# Patient Record
Sex: Male | Born: 1940 | Race: White | Hispanic: No | Marital: Married | Smoking: Former smoker
Health system: Southern US, Community
[De-identification: ages and names within clinical notes are randomized; demographics above are authoritative.]

## PROBLEM LIST (undated history)

## (undated) DIAGNOSIS — T8859XA Other complications of anesthesia, initial encounter: Secondary | ICD-10-CM

## (undated) DIAGNOSIS — I350 Nonrheumatic aortic (valve) stenosis: Secondary | ICD-10-CM

## (undated) DIAGNOSIS — K219 Gastro-esophageal reflux disease without esophagitis: Secondary | ICD-10-CM

## (undated) DIAGNOSIS — I2510 Atherosclerotic heart disease of native coronary artery without angina pectoris: Secondary | ICD-10-CM

## (undated) DIAGNOSIS — N189 Chronic kidney disease, unspecified: Secondary | ICD-10-CM

## (undated) DIAGNOSIS — I10 Essential (primary) hypertension: Secondary | ICD-10-CM

## (undated) DIAGNOSIS — Z87442 Personal history of urinary calculi: Secondary | ICD-10-CM

## (undated) DIAGNOSIS — R011 Cardiac murmur, unspecified: Secondary | ICD-10-CM

## (undated) DIAGNOSIS — M1990 Unspecified osteoarthritis, unspecified site: Secondary | ICD-10-CM

## (undated) DIAGNOSIS — T4145XA Adverse effect of unspecified anesthetic, initial encounter: Secondary | ICD-10-CM

## (undated) DIAGNOSIS — N529 Male erectile dysfunction, unspecified: Secondary | ICD-10-CM

## (undated) DIAGNOSIS — E785 Hyperlipidemia, unspecified: Secondary | ICD-10-CM

## (undated) HISTORY — DX: Hyperlipidemia, unspecified: E78.5

## (undated) HISTORY — PX: CYSTOSCOPY: SUR368

## (undated) HISTORY — DX: Essential (primary) hypertension: I10

## (undated) HISTORY — DX: Male erectile dysfunction, unspecified: N52.9

## (undated) HISTORY — DX: Atherosclerotic heart disease of native coronary artery without angina pectoris: I25.10

## (undated) HISTORY — PX: COLONOSCOPY: SHX174

## (undated) HISTORY — DX: Nonrheumatic aortic (valve) stenosis: I35.0

## (undated) HISTORY — PX: OTHER SURGICAL HISTORY: SHX169

---

## 2000-06-26 ENCOUNTER — Ambulatory Visit (HOSPITAL_COMMUNITY): Admission: RE | Admit: 2000-06-26 | Discharge: 2000-06-26 | Payer: Self-pay | Admitting: Gastroenterology

## 2000-06-26 ENCOUNTER — Encounter (INDEPENDENT_AMBULATORY_CARE_PROVIDER_SITE_OTHER): Payer: Self-pay

## 2002-06-02 ENCOUNTER — Encounter: Payer: Self-pay | Admitting: Otolaryngology

## 2002-06-02 ENCOUNTER — Encounter: Admission: RE | Admit: 2002-06-02 | Discharge: 2002-06-02 | Payer: Self-pay | Admitting: Otolaryngology

## 2002-10-25 ENCOUNTER — Encounter: Payer: Self-pay | Admitting: Family Medicine

## 2002-10-25 ENCOUNTER — Encounter: Admission: RE | Admit: 2002-10-25 | Discharge: 2002-10-25 | Payer: Self-pay | Admitting: Family Medicine

## 2008-09-20 ENCOUNTER — Encounter: Admission: RE | Admit: 2008-09-20 | Discharge: 2008-09-20 | Payer: Self-pay | Admitting: Internal Medicine

## 2009-06-04 ENCOUNTER — Encounter: Admission: RE | Admit: 2009-06-04 | Discharge: 2009-06-04 | Payer: Self-pay | Admitting: Orthopedic Surgery

## 2010-01-11 HISTORY — PX: CARDIAC CATHETERIZATION: SHX172

## 2010-01-21 ENCOUNTER — Inpatient Hospital Stay (HOSPITAL_COMMUNITY): Admission: EM | Admit: 2010-01-21 | Discharge: 2010-01-23 | Payer: Self-pay | Admitting: Emergency Medicine

## 2010-01-23 ENCOUNTER — Ambulatory Visit: Payer: Self-pay | Admitting: Surgery

## 2010-01-23 ENCOUNTER — Encounter (INDEPENDENT_AMBULATORY_CARE_PROVIDER_SITE_OTHER): Payer: Self-pay | Admitting: Cardiology

## 2010-01-31 ENCOUNTER — Ambulatory Visit (HOSPITAL_COMMUNITY): Admission: RE | Admit: 2010-01-31 | Discharge: 2010-01-31 | Payer: Self-pay | Admitting: Cardiology

## 2011-01-01 LAB — CBC
HCT: 39.7 % (ref 39.0–52.0)
HCT: 42.4 % (ref 39.0–52.0)
Hemoglobin: 14.9 g/dL (ref 13.0–17.0)
MCV: 104.5 fL — ABNORMAL HIGH (ref 78.0–100.0)
MCV: 105.2 fL — ABNORMAL HIGH (ref 78.0–100.0)
Platelets: 144 10*3/uL — ABNORMAL LOW (ref 150–400)
RBC: 3.77 MIL/uL — ABNORMAL LOW (ref 4.22–5.81)
RBC: 4.06 MIL/uL — ABNORMAL LOW (ref 4.22–5.81)
WBC: 5.3 10*3/uL (ref 4.0–10.5)
WBC: 6.3 10*3/uL (ref 4.0–10.5)

## 2011-01-01 LAB — URINE CULTURE: Colony Count: NO GROWTH

## 2011-01-01 LAB — COMPREHENSIVE METABOLIC PANEL
ALT: 49 U/L (ref 0–53)
AST: 45 U/L — ABNORMAL HIGH (ref 0–37)
Alkaline Phosphatase: 55 U/L (ref 39–117)
CO2: 27 mEq/L (ref 19–32)
Calcium: 9.3 mg/dL (ref 8.4–10.5)
Chloride: 103 mEq/L (ref 96–112)
GFR calc non Af Amer: 55 mL/min — ABNORMAL LOW (ref >60)
Potassium: 4.1 mEq/L (ref 3.5–5.1)
Sodium: 136 mEq/L (ref 135–145)

## 2011-01-01 LAB — GLUCOSE, CAPILLARY

## 2011-01-01 LAB — BASIC METABOLIC PANEL
CO2: 25 mEq/L (ref 19–32)
CO2: 26 mEq/L (ref 19–32)
Calcium: 8.7 mg/dL (ref 8.4–10.5)
Chloride: 110 mEq/L (ref 96–112)
GFR calc non Af Amer: 58 mL/min — ABNORMAL LOW (ref >60)
Sodium: 137 mEq/L (ref 135–145)
Sodium: 140 mEq/L (ref 135–145)

## 2011-01-01 LAB — DIFFERENTIAL
Basophils Absolute: 0 10*3/uL (ref 0.0–0.1)
Eosinophils Absolute: 0.1 10*3/uL (ref 0.0–0.7)
Eosinophils Relative: 2 % (ref 0–5)
Lymphocytes Relative: 34 % (ref 12–46)
Monocytes Absolute: 0.6 10*3/uL (ref 0.1–1.0)

## 2011-01-01 LAB — CK TOTAL AND CKMB (NOT AT ARMC): Relative Index: 2.6 — ABNORMAL HIGH (ref 0.0–2.5)

## 2011-01-01 LAB — APTT: aPTT: 26 seconds (ref 24–37)

## 2011-01-01 LAB — LIPID PANEL: Triglycerides: 30 mg/dL (ref <150)

## 2011-02-28 NOTE — Procedures (Signed)
[place]. [facility]  Patient:    Jeff Rivera, Jeff Rivera                          MRN: [id number] Proc. Date: 06/26/00 Adm. Date:  [id number] Disc. Date: [id number] Attending:  Nelda Marseille CC:         Teena Irani. Arlyce Dice, M.D.  Petra Kuba, M.D.   Procedure Report  PROCEDURE:  Esophagogastroduodenoscopy with biopsy.  INDICATION:  Longstanding reflux want to rule out Barretts.  The consent was signed after risks, benefits, methods and options were thoroughly discussed in the office.  MEDICINES USED:  Demerol 75 mg, Versed 7.5 mg.  PROCEDURE:  The video endoscope was inserted by direct vision.  The esophagus was normal except for some minimal reflux change.  There were no signs of Barretts esophagus.  There was a small hiatal hernia.  The scope passed easily into the stomach and advanced through the antrum pertinent for some minimal gastritis and through a normal pylorus into a normal duodenal bulb and around the ileum to a normal cecum portion and duodenum.  The scope was withdrawn back to the bulb and a good look there ruled out any ulcers in all locations.  Scope was withdrawn back to the stomach and retroflexed high in the cardia where the hiatal hernia was confirmed.  The angularis, lesser and greater curve and fundus were all normal on retroflex visualization.  On straight visualization of the stomach other than the minimal antritis no other abnormalities were seen.  The scope was then slowly withdrawn back to about 20 cm, which confirmed the above esophageal findings. The scope was then advanced to the distal esophagus and a few biopsies were obtained to rule out any microscopic problem like Barretts.  The scope was removed, the patient tolerated the procedure well.  There were no obvious immediate complications.  ENDOSCOPIC DIAGNOSES: 1. Small hiatal hernia. 2. Minimal reflux change status post distal esophageal biopsies to    rule out  Barretts. 3. Minimal antritis. 4. Otherwise normal esophagogastroduodenoscopy.  PLAN:  Continue pump inhibitors, await pathology to see if future endoscopic screening is needed and otherwise continue screening with a flex sig/colonoscopy.  Please see that dictation for details. DD:  06/26/00 TD:  06/29/00 Job: [id number]

## 2011-02-28 NOTE — Procedures (Signed)
[place]. [facility]  Patient:    Jeff Rivera, Jeff Rivera                          MRN: [id number] Proc. Date: 06/26/00 Adm. Date:  [id number] Disc. Date: [id number] Attending:  Nelda Marseille CC:         Teena Irani. Arlyce Dice, M.D.   Procedure Report  PROCEDURE PERFORMED:  Colonoscopy  INDICATION:  Screening.  Consent was signed after risks, benefits, methods and options were thoroughly discussed in the office before any premedications given.  No additional medicines were used for this procedure since it followed the endoscopy.  DESCRIPTION OF PROCEDURE:  Rectal inspection was pertinent for small external hemorrhoids.  Digital exam was negative.  Video colonoscope was inserted and very easily advanced around the colon to the cecum.  This did require a little left lower quadrant pressure but no position changes.  The cecum was identified by the appendiceal orifice and the ileocecal valve.  Prep was adequate. There was some liquid stool that required washing and suctioning. Then some left sided diverticula.  No abnormalities were seen on insertion. On slow withdrawal through the colon, the cecum, ascending, transverse and majority of the descending was all normal.  His sigmoid was slightly tortuous and some diverticula were seen but no other polypoid lesions, masses or other abnormalities were seen. Once back in the rectum, the scope was retroflexed pertinent for some internal hemorrhoids.  The scope was straightened and advanced a short ways up the sigmoid.  Air was suctioned and the scope removed.  The patient tolerated the procedure well.  There was no obvious immediate complication.  ENDOSCOPIC DIAGNOSES: 1. Internal and external hemorrhoids. 2. Left sided diverticula and tortuous sigmoid. 3. Otherwise within normal limits to the cecum.  PLAN:  Yearly rectals and guaiac per Dr. Arlyce Dice. Happy to see back p.r.n. otherwise repeat colonic screening in five to ten  years. DD:  06/26/00 TD:  06/29/00 Job: [id number]

## 2011-07-15 ENCOUNTER — Encounter (HOSPITAL_COMMUNITY): Payer: Medicare Other | Attending: Internal Medicine

## 2011-07-15 DIAGNOSIS — M810 Age-related osteoporosis without current pathological fracture: Secondary | ICD-10-CM | POA: Insufficient documentation

## 2011-09-13 HISTORY — PX: TRANSTHORACIC ECHOCARDIOGRAM: SHX275

## 2011-10-14 HISTORY — PX: EYE SURGERY: SHX253

## 2011-10-21 DIAGNOSIS — H2510 Age-related nuclear cataract, unspecified eye: Secondary | ICD-10-CM | POA: Diagnosis not present

## 2011-10-21 DIAGNOSIS — H43819 Vitreous degeneration, unspecified eye: Secondary | ICD-10-CM | POA: Diagnosis not present

## 2011-12-23 DIAGNOSIS — E785 Hyperlipidemia, unspecified: Secondary | ICD-10-CM | POA: Diagnosis not present

## 2011-12-23 DIAGNOSIS — M25569 Pain in unspecified knee: Secondary | ICD-10-CM | POA: Diagnosis not present

## 2011-12-23 DIAGNOSIS — R7301 Impaired fasting glucose: Secondary | ICD-10-CM | POA: Diagnosis not present

## 2011-12-23 DIAGNOSIS — I10 Essential (primary) hypertension: Secondary | ICD-10-CM | POA: Diagnosis not present

## 2011-12-23 DIAGNOSIS — M1710 Unilateral primary osteoarthritis, unspecified knee: Secondary | ICD-10-CM | POA: Diagnosis not present

## 2011-12-23 DIAGNOSIS — M810 Age-related osteoporosis without current pathological fracture: Secondary | ICD-10-CM | POA: Diagnosis not present

## 2011-12-23 DIAGNOSIS — K219 Gastro-esophageal reflux disease without esophagitis: Secondary | ICD-10-CM | POA: Diagnosis not present

## 2011-12-30 DIAGNOSIS — M25569 Pain in unspecified knee: Secondary | ICD-10-CM | POA: Diagnosis not present

## 2011-12-30 DIAGNOSIS — M1710 Unilateral primary osteoarthritis, unspecified knee: Secondary | ICD-10-CM | POA: Diagnosis not present

## 2012-06-24 DIAGNOSIS — M810 Age-related osteoporosis without current pathological fracture: Secondary | ICD-10-CM | POA: Diagnosis not present

## 2012-06-24 DIAGNOSIS — E785 Hyperlipidemia, unspecified: Secondary | ICD-10-CM | POA: Diagnosis not present

## 2012-06-24 DIAGNOSIS — Z125 Encounter for screening for malignant neoplasm of prostate: Secondary | ICD-10-CM | POA: Diagnosis not present

## 2012-06-24 DIAGNOSIS — R7301 Impaired fasting glucose: Secondary | ICD-10-CM | POA: Diagnosis not present

## 2012-06-24 DIAGNOSIS — I10 Essential (primary) hypertension: Secondary | ICD-10-CM | POA: Diagnosis not present

## 2012-06-24 DIAGNOSIS — Z0000 Encounter for general adult medical examination without abnormal findings: Secondary | ICD-10-CM | POA: Diagnosis not present

## 2012-07-01 DIAGNOSIS — R7301 Impaired fasting glucose: Secondary | ICD-10-CM | POA: Diagnosis not present

## 2012-07-01 DIAGNOSIS — H6120 Impacted cerumen, unspecified ear: Secondary | ICD-10-CM | POA: Diagnosis not present

## 2012-07-01 DIAGNOSIS — L570 Actinic keratosis: Secondary | ICD-10-CM | POA: Diagnosis not present

## 2012-07-01 DIAGNOSIS — M810 Age-related osteoporosis without current pathological fracture: Secondary | ICD-10-CM | POA: Diagnosis not present

## 2012-07-01 DIAGNOSIS — I10 Essential (primary) hypertension: Secondary | ICD-10-CM | POA: Diagnosis not present

## 2012-07-01 DIAGNOSIS — E785 Hyperlipidemia, unspecified: Secondary | ICD-10-CM | POA: Diagnosis not present

## 2012-08-02 DIAGNOSIS — Z23 Encounter for immunization: Secondary | ICD-10-CM | POA: Diagnosis not present

## 2012-08-02 DIAGNOSIS — M79609 Pain in unspecified limb: Secondary | ICD-10-CM | POA: Diagnosis not present

## 2012-08-13 DIAGNOSIS — S93609A Unspecified sprain of unspecified foot, initial encounter: Secondary | ICD-10-CM | POA: Diagnosis not present

## 2012-08-13 DIAGNOSIS — M216X9 Other acquired deformities of unspecified foot: Secondary | ICD-10-CM | POA: Diagnosis not present

## 2012-08-23 DIAGNOSIS — R42 Dizziness and giddiness: Secondary | ICD-10-CM | POA: Diagnosis not present

## 2012-09-27 DIAGNOSIS — E782 Mixed hyperlipidemia: Secondary | ICD-10-CM | POA: Diagnosis not present

## 2012-09-27 DIAGNOSIS — I059 Rheumatic mitral valve disease, unspecified: Secondary | ICD-10-CM | POA: Diagnosis not present

## 2012-09-27 DIAGNOSIS — I2510 Atherosclerotic heart disease of native coronary artery without angina pectoris: Secondary | ICD-10-CM | POA: Diagnosis not present

## 2012-09-27 DIAGNOSIS — I10 Essential (primary) hypertension: Secondary | ICD-10-CM | POA: Diagnosis not present

## 2012-10-21 DIAGNOSIS — H40059 Ocular hypertension, unspecified eye: Secondary | ICD-10-CM | POA: Diagnosis not present

## 2012-10-21 DIAGNOSIS — H01009 Unspecified blepharitis unspecified eye, unspecified eyelid: Secondary | ICD-10-CM | POA: Diagnosis not present

## 2012-10-21 DIAGNOSIS — H2510 Age-related nuclear cataract, unspecified eye: Secondary | ICD-10-CM | POA: Diagnosis not present

## 2012-10-21 DIAGNOSIS — H5200 Hypermetropia, unspecified eye: Secondary | ICD-10-CM | POA: Diagnosis not present

## 2012-10-25 DIAGNOSIS — H903 Sensorineural hearing loss, bilateral: Secondary | ICD-10-CM | POA: Diagnosis not present

## 2012-10-25 DIAGNOSIS — R42 Dizziness and giddiness: Secondary | ICD-10-CM | POA: Diagnosis not present

## 2012-10-25 DIAGNOSIS — H9319 Tinnitus, unspecified ear: Secondary | ICD-10-CM | POA: Diagnosis not present

## 2012-12-08 ENCOUNTER — Other Ambulatory Visit (INDEPENDENT_AMBULATORY_CARE_PROVIDER_SITE_OTHER): Payer: Self-pay | Admitting: Otolaryngology

## 2012-12-14 ENCOUNTER — Ambulatory Visit
Admission: RE | Admit: 2012-12-14 | Discharge: 2012-12-14 | Disposition: A | Discharge status: Home / Self Care | Payer: Medicare Other | Source: Ambulatory Visit | Attending: Otolaryngology | Admitting: Otolaryngology

## 2012-12-14 MED ORDER — GADOBENATE DIMEGLUMINE 529 MG/ML IV SOLN
15.0000 mL | Freq: Once | INTRAVENOUS | Status: AC | PRN
  Administered 2012-12-14: 15 mL via INTRAVENOUS

## 2013-01-04 DIAGNOSIS — I10 Essential (primary) hypertension: Secondary | ICD-10-CM | POA: Diagnosis not present

## 2013-01-04 DIAGNOSIS — E785 Hyperlipidemia, unspecified: Secondary | ICD-10-CM | POA: Diagnosis not present

## 2013-01-04 DIAGNOSIS — K219 Gastro-esophageal reflux disease without esophagitis: Secondary | ICD-10-CM | POA: Diagnosis not present

## 2013-01-26 DIAGNOSIS — N3943 Post-void dribbling: Secondary | ICD-10-CM | POA: Diagnosis not present

## 2013-01-26 DIAGNOSIS — R3915 Urgency of urination: Secondary | ICD-10-CM | POA: Diagnosis not present

## 2013-01-26 DIAGNOSIS — N3941 Urge incontinence: Secondary | ICD-10-CM | POA: Diagnosis not present

## 2013-01-26 DIAGNOSIS — N401 Enlarged prostate with lower urinary tract symptoms: Secondary | ICD-10-CM | POA: Diagnosis not present

## 2013-05-23 DIAGNOSIS — M25579 Pain in unspecified ankle and joints of unspecified foot: Secondary | ICD-10-CM | POA: Diagnosis not present

## 2013-05-23 DIAGNOSIS — M1710 Unilateral primary osteoarthritis, unspecified knee: Secondary | ICD-10-CM | POA: Diagnosis not present

## 2013-05-23 DIAGNOSIS — M25569 Pain in unspecified knee: Secondary | ICD-10-CM | POA: Diagnosis not present

## 2013-06-10 DIAGNOSIS — L02419 Cutaneous abscess of limb, unspecified: Secondary | ICD-10-CM | POA: Diagnosis not present

## 2013-06-15 ENCOUNTER — Other Ambulatory Visit: Payer: Self-pay | Admitting: Internal Medicine

## 2013-06-15 DIAGNOSIS — R0602 Shortness of breath: Secondary | ICD-10-CM

## 2013-06-15 DIAGNOSIS — I10 Essential (primary) hypertension: Secondary | ICD-10-CM | POA: Diagnosis not present

## 2013-06-16 ENCOUNTER — Telehealth (HOSPITAL_COMMUNITY): Payer: Self-pay | Admitting: Internal Medicine

## 2013-06-16 ENCOUNTER — Encounter (HOSPITAL_COMMUNITY): Payer: Self-pay | Admitting: *Deleted

## 2013-06-16 ENCOUNTER — Other Ambulatory Visit (HOSPITAL_COMMUNITY): Payer: Self-pay | Admitting: Internal Medicine

## 2013-06-16 DIAGNOSIS — R0602 Shortness of breath: Secondary | ICD-10-CM

## 2013-06-17 ENCOUNTER — Ambulatory Visit
Admission: RE | Admit: 2013-06-17 | Discharge: 2013-06-17 | Disposition: A | Discharge status: Home / Self Care | Payer: Medicare Other | Source: Ambulatory Visit | Attending: Internal Medicine | Admitting: Internal Medicine

## 2013-06-17 DIAGNOSIS — J984 Other disorders of lung: Secondary | ICD-10-CM | POA: Diagnosis not present

## 2013-06-17 DIAGNOSIS — R0602 Shortness of breath: Secondary | ICD-10-CM

## 2013-06-17 MED ORDER — IOHEXOL 300 MG/ML  SOLN
75.0000 mL | Freq: Once | INTRAMUSCULAR | Status: AC | PRN
  Administered 2013-06-17: 75 mL via INTRAVENOUS

## 2013-06-27 DIAGNOSIS — M25569 Pain in unspecified knee: Secondary | ICD-10-CM | POA: Diagnosis not present

## 2013-06-27 DIAGNOSIS — M1710 Unilateral primary osteoarthritis, unspecified knee: Secondary | ICD-10-CM | POA: Diagnosis not present

## 2013-06-27 DIAGNOSIS — M25579 Pain in unspecified ankle and joints of unspecified foot: Secondary | ICD-10-CM | POA: Diagnosis not present

## 2013-06-29 ENCOUNTER — Ambulatory Visit (HOSPITAL_COMMUNITY)
Admission: RE | Admit: 2013-06-29 | Discharge: 2013-06-29 | Disposition: A | Discharge status: Home / Self Care | Payer: Medicare Other | Source: Ambulatory Visit | Attending: Cardiovascular Disease | Admitting: Cardiovascular Disease

## 2013-06-29 DIAGNOSIS — R0602 Shortness of breath: Secondary | ICD-10-CM | POA: Diagnosis not present

## 2013-07-07 DIAGNOSIS — Z125 Encounter for screening for malignant neoplasm of prostate: Secondary | ICD-10-CM | POA: Diagnosis not present

## 2013-07-07 DIAGNOSIS — I10 Essential (primary) hypertension: Secondary | ICD-10-CM | POA: Diagnosis not present

## 2013-07-07 DIAGNOSIS — E559 Vitamin D deficiency, unspecified: Secondary | ICD-10-CM | POA: Diagnosis not present

## 2013-07-07 DIAGNOSIS — Z1331 Encounter for screening for depression: Secondary | ICD-10-CM | POA: Diagnosis not present

## 2013-07-07 DIAGNOSIS — R7301 Impaired fasting glucose: Secondary | ICD-10-CM | POA: Diagnosis not present

## 2013-07-07 DIAGNOSIS — E785 Hyperlipidemia, unspecified: Secondary | ICD-10-CM | POA: Diagnosis not present

## 2013-07-07 DIAGNOSIS — Z0000 Encounter for general adult medical examination without abnormal findings: Secondary | ICD-10-CM | POA: Diagnosis not present

## 2013-07-14 DIAGNOSIS — M810 Age-related osteoporosis without current pathological fracture: Secondary | ICD-10-CM | POA: Diagnosis not present

## 2013-07-14 DIAGNOSIS — R972 Elevated prostate specific antigen [PSA]: Secondary | ICD-10-CM | POA: Diagnosis not present

## 2013-07-14 DIAGNOSIS — I10 Essential (primary) hypertension: Secondary | ICD-10-CM | POA: Diagnosis not present

## 2013-07-14 DIAGNOSIS — Z23 Encounter for immunization: Secondary | ICD-10-CM | POA: Diagnosis not present

## 2013-07-14 DIAGNOSIS — E785 Hyperlipidemia, unspecified: Secondary | ICD-10-CM | POA: Diagnosis not present

## 2013-07-15 ENCOUNTER — Other Ambulatory Visit (HOSPITAL_COMMUNITY): Payer: Self-pay | Admitting: Internal Medicine

## 2013-07-15 DIAGNOSIS — I059 Rheumatic mitral valve disease, unspecified: Secondary | ICD-10-CM

## 2013-07-22 DIAGNOSIS — N401 Enlarged prostate with lower urinary tract symptoms: Secondary | ICD-10-CM | POA: Diagnosis not present

## 2013-07-22 DIAGNOSIS — R972 Elevated prostate specific antigen [PSA]: Secondary | ICD-10-CM | POA: Diagnosis not present

## 2013-07-25 ENCOUNTER — Ambulatory Visit (HOSPITAL_COMMUNITY)
Admission: RE | Admit: 2013-07-25 | Discharge: 2013-07-25 | Disposition: A | Discharge status: Home / Self Care | Payer: Medicare Other | Source: Ambulatory Visit | Attending: Cardiovascular Disease | Admitting: Cardiovascular Disease

## 2013-07-25 DIAGNOSIS — M810 Age-related osteoporosis without current pathological fracture: Secondary | ICD-10-CM | POA: Diagnosis not present

## 2013-07-25 DIAGNOSIS — I359 Nonrheumatic aortic valve disorder, unspecified: Secondary | ICD-10-CM | POA: Diagnosis not present

## 2013-07-25 DIAGNOSIS — I059 Rheumatic mitral valve disease, unspecified: Secondary | ICD-10-CM | POA: Insufficient documentation

## 2013-07-25 NOTE — Progress Notes (Signed)
2D Echo Performed 07/25/2013    Lavere Stork, RCS  

## 2013-08-26 DIAGNOSIS — R972 Elevated prostate specific antigen [PSA]: Secondary | ICD-10-CM | POA: Diagnosis not present

## 2013-09-05 ENCOUNTER — Encounter: Payer: Self-pay | Admitting: *Deleted

## 2013-09-06 ENCOUNTER — Encounter: Payer: Self-pay | Admitting: Internal Medicine

## 2013-09-12 ENCOUNTER — Ambulatory Visit (INDEPENDENT_AMBULATORY_CARE_PROVIDER_SITE_OTHER): Payer: Medicare Other | Admitting: Internal Medicine

## 2013-09-12 ENCOUNTER — Encounter: Payer: Self-pay | Admitting: Internal Medicine

## 2013-09-12 VITALS — BP 128/80 | HR 75 | Ht 68.0 in | Wt 174.2 lb

## 2013-09-12 DIAGNOSIS — I359 Nonrheumatic aortic valve disorder, unspecified: Secondary | ICD-10-CM

## 2013-09-12 DIAGNOSIS — I2510 Atherosclerotic heart disease of native coronary artery without angina pectoris: Secondary | ICD-10-CM | POA: Insufficient documentation

## 2013-09-12 DIAGNOSIS — Z79899 Other long term (current) drug therapy: Secondary | ICD-10-CM

## 2013-09-12 DIAGNOSIS — Z789 Other specified health status: Secondary | ICD-10-CM

## 2013-09-12 DIAGNOSIS — I350 Nonrheumatic aortic (valve) stenosis: Secondary | ICD-10-CM

## 2013-09-12 DIAGNOSIS — R0602 Shortness of breath: Secondary | ICD-10-CM | POA: Diagnosis not present

## 2013-09-12 DIAGNOSIS — E785 Hyperlipidemia, unspecified: Secondary | ICD-10-CM

## 2013-09-12 DIAGNOSIS — I10 Essential (primary) hypertension: Secondary | ICD-10-CM

## 2013-09-12 DIAGNOSIS — R0609 Other forms of dyspnea: Secondary | ICD-10-CM | POA: Diagnosis not present

## 2013-09-12 DIAGNOSIS — R9439 Abnormal result of other cardiovascular function study: Secondary | ICD-10-CM | POA: Diagnosis not present

## 2013-09-12 DIAGNOSIS — Z888 Allergy status to other drugs, medicaments and biological substances status: Secondary | ICD-10-CM

## 2013-09-12 NOTE — Patient Instructions (Signed)
Your physician has requested that you have en exercise stress myoview. For further information please visit [email or web address] Please follow instruction sheet, as given.  Your physician recommends that you return for lab work in a few days to a week. You will need to be fasting - nothing to eat/drink after midnight.  Your physician recommends that you schedule a follow-up appointment after your stress test.

## 2013-09-12 NOTE — Progress Notes (Signed)
OFFICE NOTE  Chief Complaint:  DOE  Primary Care Physician: Thayer Headings, MD  HPI:  Jeff Rivera is a 72 year old gentleman with history of hypertension, dyslipidemia and known coronary disease. He had a 40% LAD lesion by cath in 2011. He also has mild aortic stenosis with a valve area of 1.8. He had a repeat echocardiogram in December 2012 which showed an EF of greater than 55% and peak gradient of 15 mmHg, mean gradient of 10, valve area of 1.8. This does seem consistent with the sound of his aortic stenosis by auscultation. Recently he's had some problems with shortness of breath, especially when walking up a hill. His primary care provider ordered an exercise treadmill stress test as well as an echocardiogram. He did undergo exercise treadmill stress testing which was discontinued fairly early possibly due to hypertensive response, but is not clear as he felt that he could exercise further. Nevertheless the test was nonconclusive. He also underwent a repeat echocardiogram which showed a normal systolic function, and mild to moderate aortic stenosis with a valve area of approximately 1.3-1.4, which may be slightly worse than it was in 2012.  He does report a significant change in his symptoms with shortness of breath on exercise. It is not clear whether this is due to decreased activity or could represent coronary ischemia. Because of his poor performance and the fact that he did not complete a treadmill exercise stress test, I think there is still concern for possible ischemia.  PMHx:  Past Medical History  Diagnosis Date  . Hypertension   . Dyslipidemia   . CAD (coronary artery disease)   . Aortic stenosis     mild   . ED (erectile dysfunction)     Past Surgical History  Procedure Laterality Date  . Cardiac catheterization  01/2010    40% LAD lesion (Dr. Mervyn Skeeters. [place])   . Transthoracic echocardiogram  09/2011    EF=>55%, mild conc LVH; normal RV systolic function; LA mildly  dilated, IVC borderline dilated with collapse >50%; mild mitral annular calcif & mild MR; trace TR, elevated RVSP; AV mildly sclerotic, mild calcif of AV leaflets, mild valvular AS, mild regurg; trace pulm valve regurg    FAMHx:  Family History  Problem Relation Age of Onset  . Heart attack Father   . Coronary artery disease Father     SOCHx:   reports that he quit smoking about 52 years ago. He has never used smokeless tobacco. He reports that he drinks about 0.5 ounces of alcohol per week. He reports that he does not use illicit drugs.  ALLERGIES:  No Known Allergies  ROS: A comprehensive review of systems was negative except for: Respiratory: positive for dyspnea on exertion  HOME MEDS: Current Outpatient Prescriptions  Medication Sig Dispense Refill  . aspirin 81 MG tablet Take 81 mg by mouth daily.      . calcium citrate-vitamin D 500-400 MG-UNIT chewable tablet Chew 1 tablet by mouth 2 (two) times daily.      . cholecalciferol (VITAMIN D) 400 UNITS TABS tablet Take 800 Units by mouth 2 (two) times daily.      . Coenzyme Q10 (CO Q 10 PO) Take 2,000 mg by mouth daily.      . fluticasone (FLONASE) 50 MCG/ACT nasal spray as needed.      Marland Kitchen lisinopril (PRINIVIL,ZESTRIL) 10 MG tablet Take 10 mg by mouth daily.      Marland Kitchen MAGNESIUM PO Take 400 mg by mouth daily.       Marland Kitchen  NIACIN CR PO Take 1,000 mg by mouth 2 (two) times daily.       . Omega-3 Fatty Acids (FISH OIL) 1200 MG CAPS Take 1 capsule by mouth daily.      Marland Kitchen omeprazole (PRILOSEC) 20 MG capsule Take 20 mg by mouth daily.      . simvastatin (ZOCOR) 40 MG tablet Take 20 mg by mouth daily.      Marland Kitchen atorvastatin (LIPITOR) 40 MG tablet Take 40 mg by mouth daily.       No current facility-administered medications for this visit.    LABS/IMAGING: No results found for this or any previous visit (from the past 48 hour(s)). No results found.  VITALS: BP 128/80  Pulse 75  Ht 5\' 8"  (1.727 m)  Wt 174 lb 3.2 oz (79.017 kg)  BMI 26.49  kg/m2  EXAM: General appearance: alert and no distress Neck: no carotid bruit and no JVD Lungs: clear to auscultation bilaterally Heart: regular rate and rhythm, S1, S2 normal and systolic murmur: early systolic 3/6, crescendo at 2nd right intercostal space Abdomen: soft, non-tender; bowel sounds normal; no masses,  no organomegaly Extremities: extremities normal, atraumatic, no cyanosis or edema Pulses: 2+ and symmetric Skin: Skin color, texture, turgor normal. No rashes or lesions Neurologic: Grossly normal Psych: Mood, affect normal  EKG: Normal sinus rhythm at 75, possible early repolarization changes  ASSESSMENT: 1. Coronary artery disease with a known 40% LAD lesion 2. Mild to moderate aortic stenosis which is slightly worse than in 2012 3. Dyslipidemia 4. Hypertension 5. Dyspnea on exertion-possibly concerning for angina  PLAN: 1.   Mr. Zayed had a recent workup of worsening shortness of breath which revealed possibly slightly worsening of his aortic stenosis, but not enough to explain his shortness of breath. He does have known LAD disease by cath 3 years ago. Unfortunately his exercise stress test was stopped early for unknown reasons, possibly due to hypertensive response. He did feel that he can exercise further and denied any chest pain, but was short of breath with exercise. I'm concerned this could be worsening LAD ischemia and I would recommend a LexiScan nuclear stress test. Also check a lipid profile as he has been taking simvastatin and recently niacin was added. Plan to see him back in a few weeks to discuss the results.  Chrystie Nose, MD, [facility] Attending Cardiologist [facility]  Caterina Racine C 09/12/2013, 12:24 PM

## 2013-09-13 ENCOUNTER — Other Ambulatory Visit (HOSPITAL_COMMUNITY): Payer: Self-pay | Admitting: Internal Medicine

## 2013-09-13 DIAGNOSIS — I25810 Atherosclerosis of coronary artery bypass graft(s) without angina pectoris: Secondary | ICD-10-CM

## 2013-09-16 ENCOUNTER — Ambulatory Visit (HOSPITAL_COMMUNITY)
Admission: RE | Admit: 2013-09-16 | Discharge: 2013-09-16 | Disposition: A | Discharge status: Home / Self Care | Payer: Medicare Other | Source: Ambulatory Visit | Attending: Internal Medicine | Admitting: Internal Medicine

## 2013-09-16 DIAGNOSIS — I25810 Atherosclerosis of coronary artery bypass graft(s) without angina pectoris: Secondary | ICD-10-CM | POA: Diagnosis not present

## 2013-09-16 DIAGNOSIS — E663 Overweight: Secondary | ICD-10-CM | POA: Diagnosis not present

## 2013-09-16 DIAGNOSIS — R0989 Other specified symptoms and signs involving the circulatory and respiratory systems: Secondary | ICD-10-CM | POA: Insufficient documentation

## 2013-09-16 DIAGNOSIS — Z87891 Personal history of nicotine dependence: Secondary | ICD-10-CM | POA: Insufficient documentation

## 2013-09-16 DIAGNOSIS — R0609 Other forms of dyspnea: Secondary | ICD-10-CM | POA: Insufficient documentation

## 2013-09-16 DIAGNOSIS — I10 Essential (primary) hypertension: Secondary | ICD-10-CM | POA: Insufficient documentation

## 2013-09-16 DIAGNOSIS — I2510 Atherosclerotic heart disease of native coronary artery without angina pectoris: Secondary | ICD-10-CM | POA: Insufficient documentation

## 2013-09-16 DIAGNOSIS — I359 Nonrheumatic aortic valve disorder, unspecified: Secondary | ICD-10-CM | POA: Diagnosis not present

## 2013-09-16 DIAGNOSIS — Z8249 Family history of ischemic heart disease and other diseases of the circulatory system: Secondary | ICD-10-CM | POA: Diagnosis not present

## 2013-09-16 MED ORDER — REGADENOSON 0.4 MG/5ML IV SOLN
0.4000 mg | Freq: Once | INTRAVENOUS | Status: AC
  Administered 2013-09-16: 0.4 mg via INTRAVENOUS

## 2013-09-16 MED ORDER — TECHNETIUM TC 99M SESTAMIBI GENERIC - CARDIOLITE
30.8000 | Freq: Once | INTRAVENOUS | Status: AC | PRN
  Administered 2013-09-16: 30.8 via INTRAVENOUS

## 2013-09-16 MED ORDER — TECHNETIUM TC 99M SESTAMIBI GENERIC - CARDIOLITE
10.4000 | Freq: Once | INTRAVENOUS | Status: AC | PRN
  Administered 2013-09-16: 10 via INTRAVENOUS

## 2013-09-16 NOTE — Procedures (Addendum)
[facility] [address] [place] [place] [address] [phone]  Cardiology Nuclear Med Study  Jeff Rivera is a 72 y.o. male     MRN : [id number]     DOB: 1941-10-02  Procedure Date: 09/16/2013  Nuclear Med Background Indication for Stress Test:  Evaluation for Ischemia and Abnormal EKG History:  CAD;MILD AORTIC STENOSIS Cardiac Risk Factors: Family History - CAD, History of Smoking, Hypertension, Lipids and Overweight  Symptoms:  DOE and SOB   Nuclear Pre-Procedure Caffeine/Decaff Intake:  7:00pm NPO After: 5:00am   IV Site: R Hand  IV 0.9% NS with Angio Cath:  22g  Chest Size (in):  40"  IV Started by: Emmit Pomfret, RN  Height: 5\' 8"  (1.727 m)  Cup Size: n/a  BMI:  Body mass index is 26.46 kg/(m^2). Weight:  174 lb (78.926 kg)   Tech Comments:  N/A    Nuclear Med Study 1 or 2 day study: 1 day  Stress Test Type:  Lexiscan  Order Authorizing Provider:  Zoila Shutter, MD   Resting Radionuclide: Technetium 67m Sestamibi  Resting Radionuclide Dose: 10.4 mCi   Stress Radionuclide:  Technetium 44m Sestamibi  Stress Radionuclide Dose: 30.8 mCi           Stress Protocol Rest HR: 74 Stress HR: 106  Rest BP: 152/76 Stress BP: 158/71  Exercise Time (min): n/a METS: n/a          Dose of Adenosine (mg):  n/a Dose of Lexiscan: 0.4 mg  Dose of Atropine (mg): n/a Dose of Dobutamine: n/a mcg/kg/min (at max HR)  Stress Test Technologist: Ernestene Mention, CCT Nuclear Technologist: Gonzella Lex, CNMT   Rest Procedure:  Myocardial perfusion imaging was performed at rest 45 minutes following the intravenous administration of Technetium 35m Sestamibi. Stress Procedure:  The patient received IV Lexiscan 0.4 mg over 15-seconds.  Technetium 61m Sestamibi injected at 30-seconds.  There were no significant changes with Lexiscan.  Quantitative spect images were obtained after a 45 minute delay.  Transient Ischemic Dilatation (Normal  <1.22):  1.05 Lung/Heart Ratio (Normal <0.45):  0.24 QGS EDV:  65 ml QGS ESV:  11 ml LV Ejection Fraction: 82%  Rest ECG: NSR - Normal EKG  Stress ECG: No significant change from baseline ECG  QPS Raw Data Images:  Mild diaphragmatic attenuation.  Normal left ventricular size. Stress Images:  There is decreased uptake in the inferior wall. Rest Images:  There is decreased uptake in the inferior wall. Subtraction (SDS):  There is a fixed inferior defect that is most consistent with diaphragmatic attenuation.  Impression Exercise Capacity:  Lexiscan with no exercise. BP Response:  Normal blood pressure response. Clinical Symptoms:  There is dyspnea. ECG Impression:  No significant ECG changes with Lexiscan. Comparison with Prior Nuclear Study: No previous nuclear study performed  Overall Impression:  Low risk stress nuclear study with mild inferior bowel attenuation artifact.  LV Wall Motion:  NL LV Function; NL Wall Motion; EF 82%.  Chrystie Nose, MD, [facility] Board Certified in Nuclear Cardiology Attending Cardiologist [facility]  Chrystie Nose, MD  09/16/2013 11:49 AM

## 2013-11-02 DIAGNOSIS — M1710 Unilateral primary osteoarthritis, unspecified knee: Secondary | ICD-10-CM | POA: Diagnosis not present

## 2013-11-07 DIAGNOSIS — H40059 Ocular hypertension, unspecified eye: Secondary | ICD-10-CM | POA: Diagnosis not present

## 2013-11-07 DIAGNOSIS — H25019 Cortical age-related cataract, unspecified eye: Secondary | ICD-10-CM | POA: Diagnosis not present

## 2013-11-07 DIAGNOSIS — H2510 Age-related nuclear cataract, unspecified eye: Secondary | ICD-10-CM | POA: Diagnosis not present

## 2013-11-07 DIAGNOSIS — H35379 Puckering of macula, unspecified eye: Secondary | ICD-10-CM | POA: Diagnosis not present

## 2013-11-16 DIAGNOSIS — H2589 Other age-related cataract: Secondary | ICD-10-CM | POA: Diagnosis not present

## 2013-11-16 DIAGNOSIS — H25019 Cortical age-related cataract, unspecified eye: Secondary | ICD-10-CM | POA: Diagnosis not present

## 2013-11-16 DIAGNOSIS — H2510 Age-related nuclear cataract, unspecified eye: Secondary | ICD-10-CM | POA: Diagnosis not present

## 2013-11-16 DIAGNOSIS — H35379 Puckering of macula, unspecified eye: Secondary | ICD-10-CM | POA: Diagnosis not present

## 2013-12-12 DIAGNOSIS — H35379 Puckering of macula, unspecified eye: Secondary | ICD-10-CM | POA: Diagnosis not present

## 2013-12-14 DIAGNOSIS — H2589 Other age-related cataract: Secondary | ICD-10-CM | POA: Diagnosis not present

## 2013-12-14 DIAGNOSIS — H2510 Age-related nuclear cataract, unspecified eye: Secondary | ICD-10-CM | POA: Diagnosis not present

## 2013-12-14 DIAGNOSIS — H25019 Cortical age-related cataract, unspecified eye: Secondary | ICD-10-CM | POA: Diagnosis not present

## 2013-12-29 DIAGNOSIS — M1710 Unilateral primary osteoarthritis, unspecified knee: Secondary | ICD-10-CM | POA: Diagnosis not present

## 2013-12-29 DIAGNOSIS — M25569 Pain in unspecified knee: Secondary | ICD-10-CM | POA: Diagnosis not present

## 2014-01-05 DIAGNOSIS — I10 Essential (primary) hypertension: Secondary | ICD-10-CM | POA: Diagnosis not present

## 2014-01-05 DIAGNOSIS — M810 Age-related osteoporosis without current pathological fracture: Secondary | ICD-10-CM | POA: Diagnosis not present

## 2014-01-05 DIAGNOSIS — R7301 Impaired fasting glucose: Secondary | ICD-10-CM | POA: Diagnosis not present

## 2014-01-12 DIAGNOSIS — R7301 Impaired fasting glucose: Secondary | ICD-10-CM | POA: Diagnosis not present

## 2014-01-12 DIAGNOSIS — E785 Hyperlipidemia, unspecified: Secondary | ICD-10-CM | POA: Diagnosis not present

## 2014-01-12 DIAGNOSIS — M810 Age-related osteoporosis without current pathological fracture: Secondary | ICD-10-CM | POA: Diagnosis not present

## 2014-01-12 DIAGNOSIS — I10 Essential (primary) hypertension: Secondary | ICD-10-CM | POA: Diagnosis not present

## 2014-03-15 DIAGNOSIS — M76899 Other specified enthesopathies of unspecified lower limb, excluding foot: Secondary | ICD-10-CM | POA: Diagnosis not present

## 2014-03-15 DIAGNOSIS — M1710 Unilateral primary osteoarthritis, unspecified knee: Secondary | ICD-10-CM | POA: Diagnosis not present

## 2014-03-21 DIAGNOSIS — T148XXA Other injury of unspecified body region, initial encounter: Secondary | ICD-10-CM | POA: Diagnosis not present

## 2014-03-21 DIAGNOSIS — L570 Actinic keratosis: Secondary | ICD-10-CM | POA: Diagnosis not present

## 2014-03-21 DIAGNOSIS — D235 Other benign neoplasm of skin of trunk: Secondary | ICD-10-CM | POA: Diagnosis not present

## 2014-03-21 DIAGNOSIS — L821 Other seborrheic keratosis: Secondary | ICD-10-CM | POA: Diagnosis not present

## 2014-05-22 DIAGNOSIS — H6120 Impacted cerumen, unspecified ear: Secondary | ICD-10-CM | POA: Diagnosis not present

## 2014-06-26 DIAGNOSIS — M1710 Unilateral primary osteoarthritis, unspecified knee: Secondary | ICD-10-CM | POA: Diagnosis not present

## 2014-06-27 DIAGNOSIS — G5760 Lesion of plantar nerve, unspecified lower limb: Secondary | ICD-10-CM | POA: Diagnosis not present

## 2014-06-27 DIAGNOSIS — M7750 Other enthesopathy of unspecified foot: Secondary | ICD-10-CM | POA: Diagnosis not present

## 2014-07-04 DIAGNOSIS — G5760 Lesion of plantar nerve, unspecified lower limb: Secondary | ICD-10-CM | POA: Diagnosis not present

## 2014-07-04 DIAGNOSIS — M7750 Other enthesopathy of unspecified foot: Secondary | ICD-10-CM | POA: Diagnosis not present

## 2014-07-17 DIAGNOSIS — Z0000 Encounter for general adult medical examination without abnormal findings: Secondary | ICD-10-CM | POA: Diagnosis not present

## 2014-07-17 DIAGNOSIS — Z1389 Encounter for screening for other disorder: Secondary | ICD-10-CM | POA: Diagnosis not present

## 2014-07-17 DIAGNOSIS — M810 Age-related osteoporosis without current pathological fracture: Secondary | ICD-10-CM | POA: Diagnosis not present

## 2014-07-17 DIAGNOSIS — R7301 Impaired fasting glucose: Secondary | ICD-10-CM | POA: Diagnosis not present

## 2014-07-17 DIAGNOSIS — I10 Essential (primary) hypertension: Secondary | ICD-10-CM | POA: Diagnosis not present

## 2014-07-21 DIAGNOSIS — R7301 Impaired fasting glucose: Secondary | ICD-10-CM | POA: Diagnosis not present

## 2014-07-21 DIAGNOSIS — I10 Essential (primary) hypertension: Secondary | ICD-10-CM | POA: Diagnosis not present

## 2014-07-21 DIAGNOSIS — E785 Hyperlipidemia, unspecified: Secondary | ICD-10-CM | POA: Diagnosis not present

## 2014-07-21 DIAGNOSIS — Z23 Encounter for immunization: Secondary | ICD-10-CM | POA: Diagnosis not present

## 2014-07-21 DIAGNOSIS — R972 Elevated prostate specific antigen [PSA]: Secondary | ICD-10-CM | POA: Diagnosis not present

## 2014-10-09 DIAGNOSIS — M1712 Unilateral primary osteoarthritis, left knee: Secondary | ICD-10-CM | POA: Diagnosis not present

## 2014-10-09 DIAGNOSIS — M1711 Unilateral primary osteoarthritis, right knee: Secondary | ICD-10-CM | POA: Diagnosis not present

## 2014-10-11 ENCOUNTER — Other Ambulatory Visit (HOSPITAL_COMMUNITY): Payer: Self-pay | Admitting: Orthopaedic Surgery

## 2014-10-18 DIAGNOSIS — H8110 Benign paroxysmal vertigo, unspecified ear: Secondary | ICD-10-CM | POA: Diagnosis not present

## 2014-10-18 DIAGNOSIS — R102 Pelvic and perineal pain: Secondary | ICD-10-CM | POA: Diagnosis not present

## 2014-10-18 DIAGNOSIS — R109 Unspecified abdominal pain: Secondary | ICD-10-CM | POA: Diagnosis not present

## 2014-10-18 DIAGNOSIS — R100 Acute abdomen: Secondary | ICD-10-CM | POA: Diagnosis not present

## 2014-10-20 ENCOUNTER — Other Ambulatory Visit (HOSPITAL_COMMUNITY): Payer: Self-pay | Admitting: *Deleted

## 2014-10-20 ENCOUNTER — Encounter (HOSPITAL_COMMUNITY): Payer: Self-pay

## 2014-10-20 ENCOUNTER — Encounter (HOSPITAL_COMMUNITY)
Admission: RE | Admit: 2014-10-20 | Discharge: 2014-10-20 | Disposition: A | Discharge status: Home / Self Care | Payer: Medicare Other | Source: Ambulatory Visit | Attending: Orthopaedic Surgery | Admitting: Orthopaedic Surgery

## 2014-10-20 ENCOUNTER — Ambulatory Visit (HOSPITAL_COMMUNITY)
Admission: RE | Admit: 2014-10-20 | Discharge: 2014-10-20 | Disposition: A | Discharge status: Home / Self Care | Payer: Medicare Other | Source: Ambulatory Visit | Attending: Anesthesiology | Admitting: Anesthesiology

## 2014-10-20 DIAGNOSIS — I10 Essential (primary) hypertension: Secondary | ICD-10-CM

## 2014-10-20 DIAGNOSIS — I2510 Atherosclerotic heart disease of native coronary artery without angina pectoris: Secondary | ICD-10-CM | POA: Diagnosis not present

## 2014-10-20 DIAGNOSIS — E785 Hyperlipidemia, unspecified: Secondary | ICD-10-CM | POA: Diagnosis not present

## 2014-10-20 DIAGNOSIS — Z01812 Encounter for preprocedural laboratory examination: Secondary | ICD-10-CM | POA: Insufficient documentation

## 2014-10-20 DIAGNOSIS — Z01818 Encounter for other preprocedural examination: Secondary | ICD-10-CM | POA: Diagnosis not present

## 2014-10-20 DIAGNOSIS — Z7982 Long term (current) use of aspirin: Secondary | ICD-10-CM | POA: Diagnosis not present

## 2014-10-20 DIAGNOSIS — I350 Nonrheumatic aortic (valve) stenosis: Secondary | ICD-10-CM | POA: Diagnosis not present

## 2014-10-20 DIAGNOSIS — M170 Bilateral primary osteoarthritis of knee: Secondary | ICD-10-CM | POA: Insufficient documentation

## 2014-10-20 HISTORY — DX: Gastro-esophageal reflux disease without esophagitis: K21.9

## 2014-10-20 HISTORY — DX: Unspecified osteoarthritis, unspecified site: M19.90

## 2014-10-20 LAB — BASIC METABOLIC PANEL
Anion gap: 2 — ABNORMAL LOW (ref 5–15)
BUN: 24 mg/dL — ABNORMAL HIGH (ref 6–23)
CO2: 29 mmol/L (ref 19–32)
Calcium: 10 mg/dL (ref 8.4–10.5)
Chloride: 106 mEq/L (ref 96–112)
Creatinine, Ser: 1.25 mg/dL (ref 0.50–1.35)
GFR calc non Af Amer: 55 mL/min — ABNORMAL LOW (ref >90)
GFR, EST AFRICAN AMERICAN: 63 mL/min — AB (ref >90)
Glucose, Bld: 109 mg/dL — ABNORMAL HIGH (ref 70–99)
Potassium: 4.7 mmol/L (ref 3.5–5.1)
Sodium: 137 mmol/L (ref 135–145)

## 2014-10-20 LAB — URINALYSIS, ROUTINE W REFLEX MICROSCOPIC
BILIRUBIN URINE: NEGATIVE
Glucose, UA: NEGATIVE mg/dL
Hgb urine dipstick: NEGATIVE
Ketones, ur: NEGATIVE mg/dL
Leukocytes, UA: NEGATIVE
NITRITE: NEGATIVE
Protein, ur: NEGATIVE mg/dL
Specific Gravity, Urine: 1.017 (ref 1.005–1.030)
Urobilinogen, UA: 1 mg/dL (ref 0.0–1.0)
pH: 6 (ref 5.0–8.0)

## 2014-10-20 LAB — CBC
HCT: 45.2 % (ref 39.0–52.0)
Hemoglobin: 15.5 g/dL (ref 13.0–17.0)
MCH: 34.2 pg — ABNORMAL HIGH (ref 26.0–34.0)
MCHC: 34.3 g/dL (ref 30.0–36.0)
MCV: 99.8 fL (ref 78.0–100.0)
Platelets: 196 10*3/uL (ref 150–400)
RBC: 4.53 MIL/uL (ref 4.22–5.81)
RDW: 13.2 % (ref 11.5–15.5)
WBC: 7.4 10*3/uL (ref 4.0–10.5)

## 2014-10-20 LAB — APTT: aPTT: 30 seconds (ref 24–37)

## 2014-10-20 LAB — PROTIME-INR
INR: 1 (ref 0.00–1.49)
Prothrombin Time: 13.3 seconds (ref 11.6–15.2)

## 2014-10-20 LAB — SURGICAL PCR SCREEN
MRSA, PCR: NEGATIVE
Staphylococcus aureus: NEGATIVE

## 2014-10-20 LAB — ABO/RH: ABO/RH(D): O POS

## 2014-10-20 NOTE — Progress Notes (Signed)
Stress test 09-16-13 epic

## 2014-10-20 NOTE — Patient Instructions (Addendum)
Jeff Rivera  10/20/2014   Your procedure is scheduled on: Friday Oct 27, 2014  Report to [facility] Main  Entrance and follow signs to               [place] at 1050 AM.  Call this number if you have problems the morning of surgery [phone]   Remember:  Do not eat food or drink liquids :After Midnight.     Take these medicines the morning of surgery with A SIP OF WATER: FLONASE NASAL SPRAY IF NEEDED, OMEPRAZOLE                               You may not have any metal on your body including hair pins and              piercings  Do not wear jewelry, make-up, lotions, powders or perfumes.             Do not wear nail polish.  Do not shave  48 hours prior to surgery.              Men may shave face and neck.   Do not bring valuables to the hospital. [place].  Contacts, dentures or bridgework may not be worn into surgery.  Leave suitcase in the car. After surgery it may be brought to your room.     Patients discharged the day of surgery will not be allowed to drive home.  Name and phone number of your driver:  Special Instructions: N/A              Please read over the following fact sheets you were given: _____________________________________________________________________             [facility] - Preparing for Surgery Before surgery, you can play an important role.  Because skin is not sterile, your skin needs to be as free of germs as possible.  You can reduce the number of germs on your skin by washing with CHG (chlorahexidine gluconate) soap before surgery.  CHG is an antiseptic cleaner which kills germs and bonds with the skin to continue killing germs even after washing. Please DO NOT use if you have an allergy to CHG or antibacterial soaps.  If your skin becomes reddened/irritated stop using the CHG and inform your nurse when you arrive at Short Stay. Do not shave (including legs and  underarms) for at least 48 hours prior to the first CHG shower.  You may shave your face/neck. Please follow these instructions carefully:  1.  Shower with CHG Soap the night before surgery and the  morning of Surgery.  2.  If you choose to wash your hair, wash your hair first as usual with your  normal  shampoo.  3.  After you shampoo, rinse your hair and body thoroughly to remove the  shampoo.                           4.  Use CHG as you would any other liquid soap.  You can apply chg directly  to the skin and wash  Gently with a scrungie or clean washcloth.  5.  Apply the CHG Soap to your body ONLY FROM THE NECK DOWN.   Do not use on face/ open                           Wound or open sores. Avoid contact with eyes, ears mouth and genitals (private parts).                       Wash face,  Genitals (private parts) with your normal soap.             6.  Wash thoroughly, paying special attention to the area where your surgery  will be performed.  7.  Thoroughly rinse your body with warm water from the neck down.  8.  DO NOT shower/wash with your normal soap after using and rinsing off  the CHG Soap.                9.  Pat yourself dry with a clean towel.            10.  Wear clean pajamas.            11.  Place clean sheets on your bed the night of your first shower and do not  sleep with pets. Day of Surgery : Do not apply any lotions/deodorants the morning of surgery.  Please wear clean clothes to the hospital/surgery center.  FAILURE TO FOLLOW THESE INSTRUCTIONS MAY RESULT IN THE CANCELLATION OF YOUR SURGERY PATIENT SIGNATURE_________________________________  NURSE SIGNATURE__________________________________  ________________________________________________________________________

## 2014-10-24 ENCOUNTER — Other Ambulatory Visit (HOSPITAL_COMMUNITY): Payer: Self-pay | Admitting: Orthopaedic Surgery

## 2014-10-27 ENCOUNTER — Inpatient Hospital Stay (HOSPITAL_COMMUNITY): Payer: Medicare Other | Admitting: Certified Registered Nurse Anesthetist

## 2014-10-27 ENCOUNTER — Encounter (HOSPITAL_COMMUNITY): Payer: Self-pay | Admitting: *Deleted

## 2014-10-27 ENCOUNTER — Inpatient Hospital Stay (HOSPITAL_COMMUNITY): Payer: Medicare Other

## 2014-10-27 ENCOUNTER — Encounter (HOSPITAL_COMMUNITY)
Admission: RE | Disposition: A | Discharge status: Home Health | Payer: Self-pay | Source: Ambulatory Visit | Attending: Orthopaedic Surgery

## 2014-10-27 ENCOUNTER — Inpatient Hospital Stay (HOSPITAL_COMMUNITY)
Admission: RE | Admit: 2014-10-27 | Discharge: 2014-10-31 | DRG: 470 | Disposition: A | Discharge status: Home Health | Payer: Medicare Other | Source: Ambulatory Visit | Attending: Orthopaedic Surgery | Admitting: Orthopaedic Surgery

## 2014-10-27 DIAGNOSIS — Z87891 Personal history of nicotine dependence: Secondary | ICD-10-CM

## 2014-10-27 DIAGNOSIS — M1711 Unilateral primary osteoarthritis, right knee: Secondary | ICD-10-CM | POA: Diagnosis not present

## 2014-10-27 DIAGNOSIS — M1712 Unilateral primary osteoarthritis, left knee: Secondary | ICD-10-CM

## 2014-10-27 DIAGNOSIS — I2510 Atherosclerotic heart disease of native coronary artery without angina pectoris: Secondary | ICD-10-CM | POA: Diagnosis present

## 2014-10-27 DIAGNOSIS — M1990 Unspecified osteoarthritis, unspecified site: Secondary | ICD-10-CM | POA: Diagnosis not present

## 2014-10-27 DIAGNOSIS — M179 Osteoarthritis of knee, unspecified: Secondary | ICD-10-CM | POA: Diagnosis not present

## 2014-10-27 DIAGNOSIS — M25562 Pain in left knee: Secondary | ICD-10-CM | POA: Diagnosis present

## 2014-10-27 DIAGNOSIS — I10 Essential (primary) hypertension: Secondary | ICD-10-CM | POA: Diagnosis present

## 2014-10-27 DIAGNOSIS — M659 Synovitis and tenosynovitis, unspecified: Secondary | ICD-10-CM | POA: Diagnosis present

## 2014-10-27 DIAGNOSIS — Z96652 Presence of left artificial knee joint: Secondary | ICD-10-CM

## 2014-10-27 DIAGNOSIS — Z96642 Presence of left artificial hip joint: Secondary | ICD-10-CM | POA: Diagnosis not present

## 2014-10-27 DIAGNOSIS — D62 Acute posthemorrhagic anemia: Secondary | ICD-10-CM | POA: Diagnosis not present

## 2014-10-27 DIAGNOSIS — R2681 Unsteadiness on feet: Secondary | ICD-10-CM | POA: Diagnosis not present

## 2014-10-27 DIAGNOSIS — M170 Bilateral primary osteoarthritis of knee: Secondary | ICD-10-CM | POA: Diagnosis not present

## 2014-10-27 DIAGNOSIS — Z471 Aftercare following joint replacement surgery: Secondary | ICD-10-CM | POA: Diagnosis not present

## 2014-10-27 DIAGNOSIS — M25662 Stiffness of left knee, not elsewhere classified: Secondary | ICD-10-CM | POA: Diagnosis not present

## 2014-10-27 DIAGNOSIS — Z96651 Presence of right artificial knee joint: Secondary | ICD-10-CM

## 2014-10-27 DIAGNOSIS — M6281 Muscle weakness (generalized): Secondary | ICD-10-CM | POA: Diagnosis not present

## 2014-10-27 DIAGNOSIS — R531 Weakness: Secondary | ICD-10-CM | POA: Diagnosis not present

## 2014-10-27 DIAGNOSIS — E785 Hyperlipidemia, unspecified: Secondary | ICD-10-CM | POA: Diagnosis present

## 2014-10-27 HISTORY — PX: TOTAL KNEE ARTHROPLASTY: SHX125

## 2014-10-27 SURGERY — ARTHROPLASTY, KNEE, TOTAL
Anesthesia: Spinal | Site: Knee | Laterality: Left

## 2014-10-27 MED ORDER — BUPIVACAINE IN DEXTROSE 0.75-8.25 % IT SOLN
INTRATHECAL | Status: DC | PRN
  Administered 2014-10-27: 1.7 mL via INTRATHECAL

## 2014-10-27 MED ORDER — LISINOPRIL 10 MG PO TABS
10.0000 mg | ORAL_TABLET | Freq: Every morning | ORAL | Status: DC
  Administered 2014-10-28 – 2014-10-31 (×2): 10 mg via ORAL
  Filled 2014-10-27 (×4): qty 1

## 2014-10-27 MED ORDER — OXYCODONE HCL 5 MG PO TABS
5.0000 mg | ORAL_TABLET | ORAL | Status: DC | PRN
  Administered 2014-10-27 – 2014-10-30 (×8): 5 mg via ORAL
  Filled 2014-10-27 (×8): qty 1
  Filled 2014-10-27: qty 2

## 2014-10-27 MED ORDER — METHOCARBAMOL 500 MG PO TABS
500.0000 mg | ORAL_TABLET | Freq: Four times a day (QID) | ORAL | Status: DC | PRN
  Administered 2014-10-28 – 2014-10-31 (×7): 500 mg via ORAL
  Filled 2014-10-27 (×8): qty 1

## 2014-10-27 MED ORDER — PHENYLEPHRINE HCL 10 MG/ML IJ SOLN
10.0000 mg | INTRAMUSCULAR | Status: DC | PRN
  Administered 2014-10-27: 60 ug/min via INTRAVENOUS

## 2014-10-27 MED ORDER — NIACIN 500 MG PO TABS
500.0000 mg | ORAL_TABLET | Freq: Two times a day (BID) | ORAL | Status: DC
  Administered 2014-10-28 – 2014-10-31 (×6): 500 mg via ORAL
  Filled 2014-10-27 (×9): qty 1

## 2014-10-27 MED ORDER — ONDANSETRON HCL 4 MG/2ML IJ SOLN: 4.0000 mg | Freq: Four times a day (QID) | INTRAMUSCULAR | Status: DC | PRN

## 2014-10-27 MED ORDER — ASPIRIN EC 81 MG PO TBEC
81.0000 mg | DELAYED_RELEASE_TABLET | Freq: Every day | ORAL | Status: DC
  Administered 2014-10-27 – 2014-10-31 (×5): 81 mg via ORAL
  Filled 2014-10-27 (×5): qty 1

## 2014-10-27 MED ORDER — PANTOPRAZOLE SODIUM 40 MG PO TBEC
40.0000 mg | DELAYED_RELEASE_TABLET | Freq: Every day | ORAL | Status: DC
  Administered 2014-10-28 – 2014-10-31 (×4): 40 mg via ORAL
  Filled 2014-10-27 (×5): qty 1

## 2014-10-27 MED ORDER — ASPIRIN 81 MG PO TABS: 81.0000 mg | ORAL_TABLET | Freq: Every day | ORAL | Status: DC

## 2014-10-27 MED ORDER — ONDANSETRON HCL 4 MG PO TABS: 4.0000 mg | ORAL_TABLET | Freq: Four times a day (QID) | ORAL | Status: DC | PRN

## 2014-10-27 MED ORDER — MIDAZOLAM HCL 2 MG/2ML IJ SOLN
INTRAMUSCULAR | Status: AC
  Filled 2014-10-27: qty 2

## 2014-10-27 MED ORDER — MENTHOL 3 MG MT LOZG
1.0000 | LOZENGE | OROMUCOSAL | Status: DC | PRN
  Filled 2014-10-27: qty 9

## 2014-10-27 MED ORDER — HYDROMORPHONE HCL 1 MG/ML IJ SOLN
INTRAMUSCULAR | Status: AC
  Filled 2014-10-27: qty 1

## 2014-10-27 MED ORDER — METOCLOPRAMIDE HCL 10 MG PO TABS: 5.0000 mg | ORAL_TABLET | Freq: Three times a day (TID) | ORAL | Status: DC | PRN

## 2014-10-27 MED ORDER — KETOROLAC TROMETHAMINE 30 MG/ML IJ SOLN
INTRAMUSCULAR | Status: AC
  Filled 2014-10-27: qty 1

## 2014-10-27 MED ORDER — METOCLOPRAMIDE HCL 5 MG/ML IJ SOLN: 5.0000 mg | Freq: Three times a day (TID) | INTRAMUSCULAR | Status: DC | PRN

## 2014-10-27 MED ORDER — ZOLPIDEM TARTRATE 5 MG PO TABS: 5.0000 mg | ORAL_TABLET | Freq: Every evening | ORAL | Status: DC | PRN

## 2014-10-27 MED ORDER — ACETAMINOPHEN 10 MG/ML IV SOLN
1000.0000 mg | Freq: Once | INTRAVENOUS | Status: AC
  Administered 2014-10-27: 1000 mg via INTRAVENOUS
  Filled 2014-10-27: qty 100

## 2014-10-27 MED ORDER — FENTANYL CITRATE 0.05 MG/ML IJ SOLN
INTRAMUSCULAR | Status: DC | PRN
  Administered 2014-10-27: 50 ug via INTRAVENOUS
  Administered 2014-10-27: 12.5 ug via INTRAVENOUS
  Administered 2014-10-27: 25 ug via INTRAVENOUS
  Administered 2014-10-27: 12.5 ug via INTRAVENOUS

## 2014-10-27 MED ORDER — SIMVASTATIN 20 MG PO TABS
20.0000 mg | ORAL_TABLET | Freq: Every evening | ORAL | Status: DC
  Administered 2014-10-27 – 2014-10-29 (×3): 20 mg via ORAL
  Filled 2014-10-27 (×5): qty 1

## 2014-10-27 MED ORDER — PROPOFOL 10 MG/ML IV BOLUS
INTRAVENOUS | Status: DC | PRN
  Administered 2014-10-27: 20 mg via INTRAVENOUS

## 2014-10-27 MED ORDER — SODIUM CHLORIDE 0.9 % IV SOLN
1000.0000 mg | INTRAVENOUS | Status: DC
  Filled 2014-10-27: qty 10

## 2014-10-27 MED ORDER — PHENOL 1.4 % MT LIQD: 1.0000 | OROMUCOSAL | Status: DC | PRN

## 2014-10-27 MED ORDER — LACTATED RINGERS IV SOLN
INTRAVENOUS | Status: DC
  Administered 2014-10-27: 15:00:00 via INTRAVENOUS
  Administered 2014-10-27: 1000 mL via INTRAVENOUS
  Administered 2014-10-27: 15:00:00 via INTRAVENOUS

## 2014-10-27 MED ORDER — ONDANSETRON HCL 4 MG/2ML IJ SOLN
INTRAMUSCULAR | Status: DC | PRN
  Administered 2014-10-27: 4 mg via INTRAVENOUS

## 2014-10-27 MED ORDER — CEFAZOLIN SODIUM-DEXTROSE 2-3 GM-% IV SOLR
2.0000 g | INTRAVENOUS | Status: AC
  Administered 2014-10-27: 2 g via INTRAVENOUS

## 2014-10-27 MED ORDER — RIVAROXABAN 10 MG PO TABS
10.0000 mg | ORAL_TABLET | Freq: Every day | ORAL | Status: DC
  Administered 2014-10-28 – 2014-10-31 (×4): 10 mg via ORAL
  Filled 2014-10-27 (×5): qty 1

## 2014-10-27 MED ORDER — SODIUM CHLORIDE 0.9 % IJ SOLN
INTRAMUSCULAR | Status: AC
  Filled 2014-10-27: qty 50

## 2014-10-27 MED ORDER — MIDAZOLAM HCL 5 MG/5ML IJ SOLN
INTRAMUSCULAR | Status: DC | PRN
  Administered 2014-10-27 (×2): 1 mg via INTRAVENOUS

## 2014-10-27 MED ORDER — ALUM & MAG HYDROXIDE-SIMETH 200-200-20 MG/5ML PO SUSP: 30.0000 mL | ORAL | Status: DC | PRN

## 2014-10-27 MED ORDER — ACETAMINOPHEN 650 MG RE SUPP: 650.0000 mg | Freq: Four times a day (QID) | RECTAL | Status: DC | PRN

## 2014-10-27 MED ORDER — CEFAZOLIN SODIUM-DEXTROSE 2-3 GM-% IV SOLR
INTRAVENOUS | Status: AC
  Filled 2014-10-27: qty 50

## 2014-10-27 MED ORDER — 0.9 % SODIUM CHLORIDE (POUR BTL) OPTIME
TOPICAL | Status: DC | PRN
  Administered 2014-10-27: 1000 mL

## 2014-10-27 MED ORDER — FENTANYL CITRATE 0.05 MG/ML IJ SOLN
INTRAMUSCULAR | Status: AC
  Filled 2014-10-27: qty 2

## 2014-10-27 MED ORDER — ONDANSETRON HCL 4 MG/2ML IJ SOLN
INTRAMUSCULAR | Status: AC
  Filled 2014-10-27: qty 2

## 2014-10-27 MED ORDER — PROPOFOL 10 MG/ML IV BOLUS
INTRAVENOUS | Status: AC
  Filled 2014-10-27: qty 20

## 2014-10-27 MED ORDER — HYDROMORPHONE HCL 1 MG/ML IJ SOLN
0.5000 mg | INTRAMUSCULAR | Status: AC | PRN
  Administered 2014-10-27: 0.5 mg via INTRAVENOUS
  Administered 2014-10-27: 0.25 mg via INTRAVENOUS
  Administered 2014-10-27: 0.5 mg via INTRAVENOUS
  Administered 2014-10-27: 0.25 mg via INTRAVENOUS

## 2014-10-27 MED ORDER — METHOCARBAMOL 1000 MG/10ML IJ SOLN
500.0000 mg | Freq: Four times a day (QID) | INTRAVENOUS | Status: DC | PRN
  Administered 2014-10-27: 500 mg via INTRAVENOUS
  Filled 2014-10-27 (×2): qty 5

## 2014-10-27 MED ORDER — SODIUM CHLORIDE 0.9 % IJ SOLN
INTRAMUSCULAR | Status: DC | PRN
  Administered 2014-10-27: 40 mL via INTRAVENOUS

## 2014-10-27 MED ORDER — CEFAZOLIN SODIUM 1-5 GM-% IV SOLN
1.0000 g | Freq: Four times a day (QID) | INTRAVENOUS | Status: AC
  Administered 2014-10-27 – 2014-10-28 (×2): 1 g via INTRAVENOUS
  Filled 2014-10-27 (×2): qty 50

## 2014-10-27 MED ORDER — SODIUM CHLORIDE 0.9 % IV SOLN
INTRAVENOUS | Status: DC
  Administered 2014-10-27: 18:00:00 via INTRAVENOUS

## 2014-10-27 MED ORDER — SODIUM CHLORIDE 0.9 % IR SOLN
Status: DC | PRN
  Administered 2014-10-27: 1000 mL

## 2014-10-27 MED ORDER — DOCUSATE SODIUM 100 MG PO CAPS
100.0000 mg | ORAL_CAPSULE | Freq: Two times a day (BID) | ORAL | Status: DC
  Administered 2014-10-27 – 2014-10-31 (×8): 100 mg via ORAL

## 2014-10-27 MED ORDER — KETOROLAC TROMETHAMINE 30 MG/ML IJ SOLN
30.0000 mg | Freq: Once | INTRAMUSCULAR | Status: AC
  Administered 2014-10-27: 30 mg via INTRAVENOUS

## 2014-10-27 MED ORDER — BUPIVACAINE LIPOSOME 1.3 % IJ SUSP
20.0000 mL | Freq: Once | INTRAMUSCULAR | Status: AC
  Administered 2014-10-27: 20 mL
  Filled 2014-10-27: qty 20

## 2014-10-27 MED ORDER — DIPHENHYDRAMINE HCL 12.5 MG/5ML PO ELIX
12.5000 mg | ORAL_SOLUTION | ORAL | Status: DC | PRN
  Administered 2014-10-27: 12.5 mg via ORAL
  Filled 2014-10-27: qty 5

## 2014-10-27 MED ORDER — POLYETHYLENE GLYCOL 3350 17 G PO PACK
17.0000 g | PACK | Freq: Every day | ORAL | Status: DC | PRN
  Administered 2014-10-31: 17 g via ORAL
  Filled 2014-10-27: qty 1

## 2014-10-27 MED ORDER — HYDROMORPHONE HCL 1 MG/ML IJ SOLN
1.0000 mg | INTRAMUSCULAR | Status: DC | PRN
  Administered 2014-10-29: 1 mg via INTRAVENOUS
  Filled 2014-10-27: qty 1

## 2014-10-27 MED ORDER — PROPOFOL INFUSION 10 MG/ML OPTIME
INTRAVENOUS | Status: DC | PRN
  Administered 2014-10-27: 25 ug/kg/min via INTRAVENOUS

## 2014-10-27 MED ORDER — ACETAMINOPHEN 325 MG PO TABS
650.0000 mg | ORAL_TABLET | Freq: Four times a day (QID) | ORAL | Status: DC | PRN
  Administered 2014-10-28 – 2014-10-29 (×3): 650 mg via ORAL
  Filled 2014-10-27 (×3): qty 2

## 2014-10-27 SURGICAL SUPPLY — 60 items
APL SKNCLS STERI-STRIP NONHPOA (GAUZE/BANDAGES/DRESSINGS) ×1
BAG SPEC THK2 15X12 ZIP CLS (MISCELLANEOUS)
BAG ZIPLOCK 12X15 (MISCELLANEOUS) IMPLANT
BANDAGE ELASTIC 6 VELCRO ST LF (GAUZE/BANDAGES/DRESSINGS) ×3 IMPLANT
BANDAGE ESMARK 6X9 LF (GAUZE/BANDAGES/DRESSINGS) ×1 IMPLANT
BENZOIN TINCTURE PRP APPL 2/3 (GAUZE/BANDAGES/DRESSINGS) ×3 IMPLANT
BLADE SAG 13.0X1.37X90 (BLADE) IMPLANT
BLADE SAG 18X100X1.27 (BLADE) IMPLANT
BLADE SAGITTAL 25.0X1.37X90 (BLADE) IMPLANT
BLADE SAGITTAL 25.0X1.37X90MM (BLADE)
BNDG CMPR 9X6 STRL LF SNTH (GAUZE/BANDAGES/DRESSINGS) ×1
BNDG ESMARK 6X9 LF (GAUZE/BANDAGES/DRESSINGS) ×3
BOWL SMART MIX CTS (DISPOSABLE) ×3 IMPLANT
CAPT KNEE TOTAL 3 ×2 IMPLANT
CEMENT BONE 1-PACK (Cement) ×6 IMPLANT
CLOSURE WOUND 1/2 X4 (GAUZE/BANDAGES/DRESSINGS) ×1
CUFF TOURN SGL QUICK 34 (TOURNIQUET CUFF) ×3
CUFF TRNQT CYL 34X4X40X1 (TOURNIQUET CUFF) ×1 IMPLANT
DRAPE EXTREMITY T 121X128X90 (DRAPE) ×3 IMPLANT
DRAPE POUCH INSTRU U-SHP 10X18 (DRAPES) ×3 IMPLANT
DRAPE SHEET LG 3/4 BI-LAMINATE (DRAPES) IMPLANT
DRAPE U-SHAPE 47X51 STRL (DRAPES) ×3 IMPLANT
DRSG PAD ABDOMINAL 8X10 ST (GAUZE/BANDAGES/DRESSINGS) ×3 IMPLANT
DURAPREP 26ML APPLICATOR (WOUND CARE) ×3 IMPLANT
ELECT REM PT RETURN 9FT ADLT (ELECTROSURGICAL) ×3
ELECTRODE REM PT RTRN 9FT ADLT (ELECTROSURGICAL) ×1 IMPLANT
FACESHIELD WRAPAROUND (MASK) ×15 IMPLANT
GAUZE SPONGE 4X4 12PLY STRL (GAUZE/BANDAGES/DRESSINGS) ×3 IMPLANT
GAUZE XEROFORM 1X8 LF (GAUZE/BANDAGES/DRESSINGS) IMPLANT
GLOVE BIO SURGEON STRL SZ7.5 (GLOVE) ×3 IMPLANT
GLOVE BIOGEL PI IND STRL 8 (GLOVE) ×2 IMPLANT
GLOVE BIOGEL PI INDICATOR 8 (GLOVE) ×4
GLOVE ECLIPSE 8.0 STRL XLNG CF (GLOVE) ×3 IMPLANT
GOWN STRL REUS W/TWL XL LVL3 (GOWN DISPOSABLE) ×6 IMPLANT
HANDPIECE INTERPULSE COAX TIP (DISPOSABLE) ×3
IMMOBILIZER KNEE 20 (SOFTGOODS) ×3
IMMOBILIZER KNEE 20 THIGH 36 (SOFTGOODS) ×1 IMPLANT
KIT BASIN OR (CUSTOM PROCEDURE TRAY) ×3 IMPLANT
NS IRRIG 1000ML POUR BTL (IV SOLUTION) ×3 IMPLANT
PACK TOTAL JOINT (CUSTOM PROCEDURE TRAY) ×3 IMPLANT
PAD ABD 8X10 STRL (GAUZE/BANDAGES/DRESSINGS) ×2 IMPLANT
PADDING CAST COTTON 6X4 STRL (CAST SUPPLIES) ×4 IMPLANT
POSITIONER SURGICAL ARM (MISCELLANEOUS) ×3 IMPLANT
SET HNDPC FAN SPRY TIP SCT (DISPOSABLE) ×1 IMPLANT
SET PAD KNEE POSITIONER (MISCELLANEOUS) ×3 IMPLANT
STAPLER VISISTAT 35W (STAPLE) IMPLANT
STRIP CLOSURE SKIN 1/2X4 (GAUZE/BANDAGES/DRESSINGS) ×1 IMPLANT
SUCTION FRAZIER 12FR DISP (SUCTIONS) ×3 IMPLANT
SUT MNCRL AB 4-0 PS2 18 (SUTURE) IMPLANT
SUT VIC AB 0 CT1 27 (SUTURE) ×3
SUT VIC AB 0 CT1 27XBRD ANTBC (SUTURE) ×1 IMPLANT
SUT VIC AB 1 CT1 27 (SUTURE) ×6
SUT VIC AB 1 CT1 27XBRD ANTBC (SUTURE) ×2 IMPLANT
SUT VIC AB 2-0 CT1 27 (SUTURE) ×6
SUT VIC AB 2-0 CT1 TAPERPNT 27 (SUTURE) ×2 IMPLANT
TOWEL OR 17X26 10 PK STRL BLUE (TOWEL DISPOSABLE) ×3 IMPLANT
TOWEL OR NON WOVEN STRL DISP B (DISPOSABLE) IMPLANT
TRAY FOLEY CATH 16FRSI W/METER (SET/KITS/TRAYS/PACK) ×2 IMPLANT
WATER STERILE IRR 1500ML POUR (IV SOLUTION) ×3 IMPLANT
WRAP KNEE MAXI GEL POST OP (GAUZE/BANDAGES/DRESSINGS) ×3 IMPLANT

## 2014-10-27 NOTE — Anesthesia Preprocedure Evaluation (Addendum)
Anesthesia Evaluation  Patient identified by MRN, date of birth, ID band Patient awake    Reviewed: Allergy & Precautions, H&P , Patient's Chart, lab work & pertinent test results  Airway Mallampati: III  TM Distance: >3 FB Neck ROM: Limited    Dental no notable dental hx. (+) Caps   Pulmonary former smoker,  breath sounds clear to auscultation  Pulmonary exam normal       Cardiovascular Exercise Tolerance: Good hypertension, Rhythm:regular Rate:Normal     Neuro/Psych    GI/Hepatic   Endo/Other    Renal/GU      Musculoskeletal   Abdominal   Peds  Hematology   Anesthesia Other Findings   Reproductive/Obstetrics                           Anesthesia Physical Anesthesia Plan  ASA: II  Anesthesia Plan: Spinal   Post-op Pain Management:    Induction:   Airway Management Planned:   Additional Equipment:   Intra-op Plan:   Post-operative Plan:   Informed Consent: I have reviewed the patients History and Physical, chart, labs and discussed the procedure including the risks, benefits and alternatives for the proposed anesthesia with the patient or authorized representative who has indicated his/her understanding and acceptance.   Dental Advisory Given  Plan Discussed with: CRNA  Anesthesia Plan Comments: (Lab work confirmed with CRNA in room. Platelets okay.  Plan to use neosyn infusion at onset, along with SAB. No anti-coagulants. Discussed spinal anesthetic, and patient consents to the procedure:  included risk of possible headache,backache, failed block, allergic reaction, and nerve injury. This patient was asked if she had any questions or concerns before the procedure started. )       Anesthesia Quick Evaluation

## 2014-10-27 NOTE — Anesthesia Procedure Notes (Signed)
Spinal  Start time: 10/27/2014 1:30 PM End time: 10/27/2014 1:35 PM Staffing Anesthesiologist: Lyndle Herrlich Resident/CRNA: Darlys Gales R Performed by: resident/CRNA  Preanesthetic Checklist Completed: patient identified, site marked, surgical consent, pre-op evaluation, timeout performed, IV checked, risks and benefits discussed and monitors and equipment checked Spinal Block Patient position: sitting Prep: ChloraPrep Patient monitoring: heart rate, cardiac monitor, continuous pulse ox and blood pressure Approach: midline Location: L3-4 Injection technique: single-shot Needle Needle type: Sprotte  Needle gauge: 24 G Needle length: 9 cm Needle insertion depth: 7 cm Assessment Sensory level: T10

## 2014-10-27 NOTE — Transfer of Care (Signed)
Immediate Anesthesia Transfer of Care Note  Patient: Jeff Rivera  Procedure(s) Performed: Procedure(s): LEFT TOTAL KNEE ARTHROPLASTY (Left)  Patient Location: PACU  Anesthesia Type:MAC and Spinal  Level of Consciousness: Patient comfortable, cooperative, following commands, responds to stimulation.   Airway & Oxygen Therapy: Patient spontaneously breathing, ventilating well, oxygen via simple oxygen mask.  Post-op Assessment: Report given to PACU RN, vital signs reviewed and stable, moving all extremities, spinal resolved.   Post vital signs: Reviewed and stable.  Complications: No apparent anesthesia complications

## 2014-10-27 NOTE — Anesthesia Postprocedure Evaluation (Signed)
  Anesthesia Post-op Note  Patient: Jeff Rivera  Procedure(s) Performed: Procedure(s): LEFT TOTAL KNEE ARTHROPLASTY (Left)  Patient Location: PACU  Anesthesia Type:Spinal  Level of Consciousness: awake and alert   Airway and Oxygen Therapy: Patient Spontanous Breathing  Post-op Pain: none  Post-op Assessment: Post-op Vital signs reviewed  Post-op Vital Signs: Reviewed  Last Vitals:  Filed Vitals:   10/27/14 1642  BP: 140/65  Pulse: 81  Temp: 36.8 C  Resp:     Complications: No apparent anesthesia complications

## 2014-10-27 NOTE — Brief Op Note (Signed)
10/27/2014  2:57 PM  PATIENT:  Jeff Rivera  74 y.o. male  PRE-OPERATIVE DIAGNOSIS:  Osteoarthritis left knee  POST-OPERATIVE DIAGNOSIS:  Osteoarthritis left knee  PROCEDURE:  Procedure(s): LEFT TOTAL KNEE ARTHROPLASTY (Left)  SURGEON:  Surgeon(s) and Role:    * Mcarthur Rossetti, MD - Primary  PHYSICIAN ASSISTANT: Benita Stabile, PA-C  ANESTHESIA:   spinal  EBL:  Total I/O In: 1000 [I.V.:1000] Out: -   BLOOD ADMINISTERED:none  DRAINS: none   LOCAL MEDICATIONS USED:  OTHER Experil  SPECIMEN:  No Specimen  DISPOSITION OF SPECIMEN:  N/A  COUNTS:  YES  TOURNIQUET:   Total Tourniquet Time Documented: Thigh (Left) - 41 minutes Total: Thigh (Left) - 41 minutes   DICTATION: .Other Dictation: Dictation Number [id number]  PLAN OF CARE: Admit to inpatient   PATIENT DISPOSITION:  PACU - hemodynamically stable.   Delay start of Pharmacological VTE agent (>24hrs) due to surgical blood loss or risk of bleeding: no

## 2014-10-27 NOTE — Progress Notes (Signed)
Utilization review completed.  

## 2014-10-27 NOTE — H&P (Signed)
TOTAL KNEE ADMISSION H&P  Patient is being admitted for left total knee arthroplasty.  Subjective:  Chief Complaint:left knee pain.  HPI: Jeff Rivera, 74 y.o. male, has a history of pain and functional disability in the left knee due to arthritis and has failed non-surgical conservative treatments for greater than 12 weeks to includeNSAID's and/or analgesics, corticosteriod injections, viscosupplementation injections, flexibility and strengthening excercises and activity modification.  Onset of symptoms was gradual, starting 5 years ago with gradually worsening course since that time. The patient noted no past surgery on the left knee(s).  Patient currently rates pain in the left knee(s) at 10 out of 10 with activity. Patient has night pain, worsening of pain with activity and weight bearing, pain that interferes with activities of daily living, pain with passive range of motion, crepitus and joint swelling.  Patient has evidence of subchondral sclerosis, periarticular osteophytes and joint space narrowing by imaging studies. There is no active infection.  Patient Active Problem List   Diagnosis Date Noted  . Arthritis of left knee 10/27/2014  . CAD (coronary artery disease) 09/12/2013  . Aortic stenosis 09/12/2013  . Dyslipidemia 09/12/2013  . HTN (hypertension) 09/12/2013  . DOE (dyspnea on exertion) 09/12/2013   Past Medical History  Diagnosis Date  . Hypertension   . Dyslipidemia   . CAD (coronary artery disease)   . Aortic stenosis     mild   . ED (erectile dysfunction)   . GERD (gastroesophageal reflux disease)   . Arthritis     OA    Past Surgical History  Procedure Laterality Date  . Cardiac catheterization  01/2010    40% LAD lesion (Dr. Loni Muse. [place])   . Transthoracic echocardiogram  09/2011    EF=>55%, mild conc LVH; normal RV systolic function; LA mildly dilated, IVC borderline dilated with collapse >50%; mild mitral annular calcif & mild MR; trace TR, elevated RVSP; AV  mildly sclerotic, mild calcif of AV leaflets, mild valvular AS, mild regurg; trace pulm valve regurg  . Eye surgery Bilateral 2013    LENS REPLACMENT FOR CATRACTS  . 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 Smoking status: Former Smoker    Types: Cigarettes    Quit date: 09/05/1961  . Smokeless tobacco: Never Used  . Alcohol Use: 0.5 oz/week    1 Not specified per week     Comment: 3-4 GLASSES WINE PER WEEK    Family History  Problem Relation Age of Onset  . Heart attack Father   . Coronary artery disease Father      Review of Systems  Musculoskeletal: Positive for joint pain.  All other systems reviewed and are negative.   Objective:  Physical Exam  Constitutional: He is oriented to person, place, and time. He appears well-developed and well-nourished.  HENT:  Head: Normocephalic and atraumatic.  Eyes: EOM are normal. Pupils are equal, round, and reactive to light.  Neck: Normal range of motion. Neck supple.  Cardiovascular: Normal rate and regular rhythm.   Respiratory: Effort normal and breath sounds normal.  GI: Soft. Bowel sounds are normal.  Musculoskeletal:       Left knee: He exhibits decreased range of motion, swelling, effusion and abnormal alignment. Tenderness found. Medial joint line tenderness noted.  Neurological: He is alert and oriented to person, place, and time.  Skin: Skin is warm and dry.  Psychiatric: He has a normal mood and affect.    Vital signs in last 24 hours:    Labs:   Estimated body mass index is 26.46 kg/(m^2) as calculated from the following:   Height as of 09/16/13: 5\' 8"  (1.727 m).   Weight as of 09/16/13: 78.926 kg (174 lb).   Imaging Review Plain radiographs demonstrate severe degenerative joint disease of the left knee(s). The overall alignment ismild varus. The bone quality appears to be excellent for age and reported activity  level.  Assessment/Plan:  End stage arthritis, left knee   The patient history, physical examination, clinical judgment of the provider and imaging studies are consistent with end stage degenerative joint disease of the left knee(s) and total knee arthroplasty is deemed medically necessary. The treatment options including medical management, injection therapy arthroscopy and arthroplasty were discussed at length. The risks and benefits of total knee arthroplasty were presented and reviewed. The risks due to aseptic loosening, infection, stiffness, patella tracking problems, thromboembolic complications and other imponderables were discussed. The patient acknowledged the explanation, agreed to proceed with the plan and consent was signed. Patient is being admitted for inpatient treatment for surgery, pain control, PT, OT, prophylactic antibiotics, VTE prophylaxis, progressive ambulation and ADL's and discharge planning. The patient is planning to be discharged home with home health services

## 2014-10-28 LAB — BASIC METABOLIC PANEL
Anion gap: 6 (ref 5–15)
BUN: 26 mg/dL — ABNORMAL HIGH (ref 6–23)
CALCIUM: 8.8 mg/dL (ref 8.4–10.5)
CHLORIDE: 104 meq/L (ref 96–112)
CO2: 27 mmol/L (ref 19–32)
Creatinine, Ser: 1.35 mg/dL (ref 0.50–1.35)
GFR calc non Af Amer: 50 mL/min — ABNORMAL LOW (ref >90)
GFR, EST AFRICAN AMERICAN: 58 mL/min — AB (ref >90)
Glucose, Bld: 132 mg/dL — ABNORMAL HIGH (ref 70–99)
Potassium: 4.2 mmol/L (ref 3.5–5.1)
Sodium: 137 mmol/L (ref 135–145)

## 2014-10-28 LAB — CBC
HCT: 31.1 % — ABNORMAL LOW (ref 39.0–52.0)
HEMOGLOBIN: 10.4 g/dL — AB (ref 13.0–17.0)
MCH: 33.5 pg (ref 26.0–34.0)
MCHC: 33.4 g/dL (ref 30.0–36.0)
MCV: 100.3 fL — AB (ref 78.0–100.0)
Platelets: 164 10*3/uL (ref 150–400)
RBC: 3.1 MIL/uL — ABNORMAL LOW (ref 4.22–5.81)
RDW: 13 % (ref 11.5–15.5)
WBC: 8 10*3/uL (ref 4.0–10.5)

## 2014-10-28 MED ORDER — METHOCARBAMOL 500 MG PO TABS: 500.0000 mg | ORAL_TABLET | Freq: Four times a day (QID) | ORAL | Status: DC | PRN

## 2014-10-28 MED ORDER — OXYCODONE-ACETAMINOPHEN 5-325 MG PO TABS: 1.0000 | ORAL_TABLET | ORAL | Status: DC | PRN

## 2014-10-28 MED ORDER — RIVAROXABAN 10 MG PO TABS: 10.0000 mg | ORAL_TABLET | Freq: Every day | ORAL | Status: DC

## 2014-10-28 NOTE — Evaluation (Signed)
Physical Therapy Evaluation Patient Details Name: Jeff Rivera MRN: [id number] DOB: [birth year] Today's Date: 10/28/2014   History of Present Illness  s/p L TKA  Clinical Impression  S/p L TKA to benefit from PT to increase ROM and strength on LLE and increase safety and mobiliy in order to return home with wife safely.     Follow Up Recommendations Home health PT    Equipment Recommendations  None recommended by PT (pt has equipment already)    Recommendations for Other Services       Precautions / Restrictions Precautions Precautions: Knee Required Braces or Orthoses: Knee Immobilizer - Right Knee Immobilizer - Right: Discontinue once straight leg raise with < 10 degree lag Restrictions Weight Bearing Restrictions: No (WBAT)      Mobility  Bed Mobility Overal bed mobility: Needs Assistance Bed Mobility: Supine to Sit     Supine to sit: Min assist     General bed mobility comments: assisted with L LE and upper body  Transfers Overall transfer level: Needs assistance Equipment used: Rolling walker (2 wheeled) Transfers: Sit to/from Stand Sit to Stand: Mod assist         General transfer comment: difficulty rising fornm surface needed a lot of extra help to rise  Ambulation/Gait Ambulation/Gait assistance: Min assist Ambulation Distance (Feet): 7 Feet Assistive device: Rolling walker (2 wheeled) Gait Pattern/deviations: Step-to pattern Gait velocity: slow guarded   General Gait Details: lots of cues for sequencing and progressing LEs , slow and guareded with pain  Stairs            Wheelchair Mobility    Modified Rankin (Stroke Patients Only)       Balance                                             Pertinent Vitals/Pain Pain Assessment: 0-10 Pain Score: 7  Pain Location: L knee Pain Descriptors / Indicators: Stabbing Pain Intervention(s): Premedicated before session;Monitored during session;Ice applied;Repositioned     Home Living Family/patient expects to be discharged to:: Private residence Living Arrangements: Spouse/significant other Available Help at Discharge: Family Type of Home: House Home Access: Stairs to enter Entrance Stairs-Rails: Right Entrance Stairs-Number of Steps: 5 Home Layout: One level Home Equipment: Environmental consultant - 2 wheels;Bedside commode;Cane - single point      Prior Function Level of Independence: Independent               Hand Dominance        Extremity/Trunk Assessment               Lower Extremity Assessment: LLE deficits/detail   LLE Deficits / Details: 10-40  degrees in supine, very guraded with flexion due to pain     Communication   Communication: No difficulties  Cognition Arousal/Alertness: Awake/alert Behavior During Therapy: WFL for tasks assessed/performed Overall Cognitive Status: Within Functional Limits for tasks assessed                      General Comments      Exercises Total Joint Exercises Ankle Circles/Pumps: AROM;5 reps;Left Quad Sets: AAROM;Left;5 reps Heel Slides: AAROM;Left;5 reps Straight Leg Raises: AAROM;Left;5 reps Goniometric ROM: 10-40 guared with pain      Assessment/Plan    PT Assessment Patient needs continued PT services  PT Diagnosis Difficulty walking   PT Problem List Decreased strength;Decreased  range of motion;Decreased activity tolerance;Decreased mobility;Decreased knowledge of use of DME  PT Treatment Interventions DME instruction;Gait training;Stair training;Functional mobility training;Therapeutic activities;Therapeutic exercise   PT Goals (Current goals can be found in the Care Plan section) Acute Rehab PT Goals Patient Stated Goal: to make it home soon hoepfully for the ball games PT Goal Formulation: With patient Time For Goal Achievement: 11/11/14 Potential to Achieve Goals: Good    Frequency 7X/week   Barriers to discharge        Co-evaluation               End  of Session Equipment Utilized During Treatment: Gait belt Activity Tolerance: Patient tolerated treatment well;Patient limited by pain Patient left: in chair;with family/visitor present Nurse Communication: Mobility status         Time: 1120-1205 PT Time Calculation (min) (ACUTE ONLY): 45 min   Charges:   PT Evaluation $Initial PT Evaluation Tier I: 1 Procedure PT Treatments $Gait Training: 8-22 mins $Therapeutic Exercise: 8-22 mins $Therapeutic Activity: 8-22 mins   PT G Codes:        Jerusha Reising 11-21-2014, 1:10 PM  Clide Dales, PT Pager: [phone] November 21, 2014

## 2014-10-28 NOTE — Plan of Care (Signed)
Problem: Consults Goal: Diagnosis- Total Joint Replacement Outcome: Completed/Met Date Met:  10/28/14 Primary Total Knee   LEFT

## 2014-10-28 NOTE — Progress Notes (Signed)
Physical Therapy Treatment Patient Details Name: Jeff Rivera MRN: [id number] DOB: March 22, 1941 Today's Date: Nov 04, 2014    History of Present Illness s/p L TKA    PT Comments    Pt with some confusion this afternoon, however following commands but trying to get up out of chair when wife telling him not to. Pt with much greater difficult standing from shari and required +2 assist , then very difficult for him to follow commands to move feet appropriately to pivot. We had to physically pivot pt to sit down, and nursing present checking BPS, which seemed to drop (recorded in nursing notes).   Follow Up Recommendations  Home health PT     Equipment Recommendations  None recommended by PT    Recommendations for Other Services       Precautions / Restrictions Precautions Precautions: Knee Required Braces or Orthoses: Knee Immobilizer - Right Knee Immobilizer - Right: Discontinue once straight leg raise with < 10 degree lag    Mobility  Bed Mobility Overal bed mobility: Needs Assistance;+2 for physical assistance Bed Mobility: Sit to Supine       Sit to supine: Max assist   General bed mobility comments: assisted with L LE and upper body  Transfers Overall transfer level: Needs assistance Equipment used: Rolling walker (2 wheeled) Transfers: Sit to/from Stand Sit to Stand: +2 physical assistance;Mod assist         General transfer comment: difficulty rising fornm surface needed a lot of extra help to rise  Ambulation/Gait                 Stairs            Wheelchair Mobility    Modified Rankin (Stroke Patients Only)       Balance                                    Cognition Arousal/Alertness: Awake/alert Behavior During Therapy: WFL for tasks assessed/performed Overall Cognitive Status: Within Functional Limits for tasks assessed                      Exercises      General Comments        Pertinent Vitals/Pain  Pain Assessment: 0-10 Pain Score: 6  Pain Location: L knee Pain Descriptors / Indicators: Sharp Pain Intervention(s): Monitored during session;Ice applied    Home Living                      Prior Function            PT Goals (current goals can now be found in the care plan section) Progress towards PT goals: Progressing toward goals    Frequency  7X/week    PT Plan Current plan remains appropriate    Co-evaluation             End of Session Equipment Utilized During Treatment: Gait belt Activity Tolerance: Patient limited by lethargy;Patient limited by pain Patient left: in bed;with family/visitor present;with nursing/sitter in room     Time: 1430-1455 PT Time Calculation (min) (ACUTE ONLY): 25 min  Charges:  $Therapeutic Activity: 23-37 mins                    G Codes:      Floraine Buechler November 04, 2014, 7:05 PM

## 2014-10-28 NOTE — Progress Notes (Signed)
   Subjective:  Patient reports pain as moderate.    Objective:   VITALS:   Filed Vitals:   10/27/14 1953 10/27/14 2227 10/28/14 0148 10/28/14 0526  BP: 114/56 108/60 91/67 102/75  Pulse: 88 80 99 118  Temp: 98.1 F (36.7 C) 97.8 F (36.6 C) 98.6 F (37 C) 99.1 F (37.3 C)  TempSrc: Oral Oral Oral Oral  Resp: 15 15 15 16   Height:      Weight:      SpO2: 100% 100% 96% 95%    Neurologically intact Neurovascular intact Sensation intact distally Intact pulses distally Dorsiflexion/Plantar flexion intact Incision: dressing C/D/I and no drainage No cellulitis present Compartment soft   Lab Results  Component Value Date   WBC 8.0 10/28/2014   HGB 10.4* 10/28/2014   HCT 31.1* 10/28/2014   MCV 100.3* 10/28/2014   PLT 164 10/28/2014     Assessment/Plan:  1 Day Post-Op   - Expected postop acute blood loss anemia - will monitor for symptoms - Up with PT/OT - DVT ppx - SCDs, ambulation, xarelto - WBAT left lower extremity - Pain control - Discharge planning  Marianna Payment 10/28/2014, 9:14 AM [phone]

## 2014-10-28 NOTE — Care Management (Signed)
CARE MANAGEMENT NOTE 10/28/2014  Patient:  Jeff Rivera, Jeff Rivera   Account Number:  [id number]  Date Initiated:  10/28/2014  Documentation initiated by:  Apolonio Schneiders  Subjective/Objective Assessment:   Left total knee arthroplasty     Action/Plan:   Anticipated DC Date:  10/30/2014   Anticipated DC Plan:  [place]  CM consult      [facility] Choice  HOME HEALTH   Choice offered to / List presented to:  C-1 Patient        [place] arranged  HH-2 PT      Page   Status of service:  Completed, signed off Medicare Important Message given?   (If response is "NO", the following Medicare IM given date fields will be blank) Date Medicare IM given:   Medicare IM given by:   Date Additional Medicare IM given:   Additional Medicare IM given by:    Discharge Disposition:  [place]  Per UR Regulation:    If discussed at Long Length of Stay Meetings, dates discussed:    Comments:  10/28/14 1200 - CM spoke with patient. Has selected [facility] for home health PT. Has a rolling walker and 3N1 at home. Arville Go will follow patient at discharge. Venita Sheffield RN BSN CCM [phone]  CARE MANAGEMENT NOTE 10/28/2014  Patient:  Jeff Rivera, Jeff Rivera   Account Number:  [id number]  Date Initiated:  10/28/2014  Documentation initiated by:  Apolonio Schneiders  Subjective/Objective Assessment:   Left total knee arthroplasty     Action/Plan:   Anticipated DC Date:  10/30/2014   Anticipated DC Plan:  [place]  CM consult      [facility] Choice  HOME HEALTH   Choice offered to / List presented to:  C-1 Patient        [place] arranged  HH-2 PT      [place]   Status of service:  Completed, signed off Medicare Important Message given?   (If response is "NO", the following Medicare IM given date fields will be blank) Date Medicare IM given:    Medicare IM given by:   Date Additional Medicare IM given:   Additional Medicare IM given by:    Discharge Disposition:  [place]  Per UR Regulation:    If discussed at Long Length of Stay Meetings, dates discussed:    Comments:  10/28/14 1200 - CM spoke with patient. Has selected [facility] for home health PT. Has a rolling walker and 3N1 at home. Arville Go will follow patient at discharge. Venita Sheffield RN BSN CCM [phone]  CARE MANAGEMENT NOTE 10/28/2014  Patient:  Jeff Rivera, Jeff Rivera   Account Number:  [id number]  Date Initiated:  10/28/2014  Documentation initiated by:  Apolonio Schneiders  Subjective/Objective Assessment:   Left total knee arthroplasty     Action/Plan:   Anticipated DC Date:  10/30/2014   Anticipated DC Plan:  [place]  CM consult      [facility] Choice  HOME HEALTH   Choice offered to / List presented to:  C-1 Patient        [place] arranged  HH-2 PT      [place]   Status of service:  Completed, signed off Medicare Important Message  given?   (If response is "NO", the following Medicare IM given date fields will be blank) Date Medicare IM given:   Medicare IM given by:   Date Additional Medicare IM given:   Additional Medicare IM given by:    Discharge Disposition:  [place]  Per UR Regulation:    If discussed at Long Length of Stay Meetings, dates discussed:    Comments:  10/28/14 1200 - CM spoke with patient. Has selected [facility] for home health PT. Has a rolling walker and 3N1 at home. Arville Go will follow patient at discharge. Venita Sheffield RN BSN CCM [phone]

## 2014-10-28 NOTE — Op Note (Signed)
Jeff Rivera, Jeff Rivera NO.:  [id number]  MEDICAL RECORD NO.:  [id number]  LOCATION:  8182                         FACILITY:  [facility]  PHYSICIAN:  Jeff Rivera, M.D.DATE OF BIRTH:  12/27/1940  DATE OF PROCEDURE:  10/27/2014 DATE OF DISCHARGE:                              OPERATIVE REPORT   PREOPERATIVE DIAGNOSIS:  Primary osteoarthritis and degenerative joint disease, left knee.  POSTOPERATIVE DIAGNOSIS:  Primary osteoarthritis and degenerative joint disease, left knee.  PROCEDURE:  Left total knee arthroplasty.  IMPLANTS:  Stryker Triathlon knee with size 4 femur, size 5 tibial tray, 9 mm fix bearing polyethylene insert, size 38 patellar button.  SURGEON:  Jeff Guest. Ninfa Linden, MD  ASSISTANT:  Erskine Emery, PA-C  ANESTHESIA: 1. Spinal. 2. Local with Exparel.  TOURNIQUET TIME:  41 minutes.  BLOOD LOSS:  Less than 100 mL.  ANTIBIOTICS:  2 g IV Ancef.  COMPLICATIONS:  None.  INDICATIONS:  Mr. Jeff Rivera is a 74 year old gentleman well known to me.  He is very active and has mild varus deformity with significant osteoarthritis of both knees.  His left knee is hurt worse than the right.  He has tried all modes of conservative treatment.  His pain is daily.  His activity is limited and is affecting his mobility and quality of life.  He does at this point wish to proceed with a total knee arthroplasty given his failure to conservative treatment.  He understands the risk of acute blood loss anemia, nerve and vessel injury, fracture, infection, DVT.  He understands the goals are decreased pain, improved mobility, and overall improved quality of life.  PROCEDURE DESCRIPTION:  After informed consent was obtained, appropriate left knee was marked.  He was brought to the operating room.  Spinal anesthesia was obtained while sitting up on the operating table.  He was laid in the supine position.  A Foley catheter was placed.  A nonsterile tourniquet  was placed on his upper left thigh.  His left leg was then prepped and draped with DuraPrep and sterile drapes.  A time-out was called to identify correct patient, correct left knee.  I then used an Esmarch to wrap out the leg and tourniquet was inflated to 300 mmHg.  We then made a midline incision over the patella and carried this proximally distally.  We dissected down the knee joint and carried out a medial parapatellar arthrotomy, found a moderate effusion.  We found significant arthritis mainly involving medial compartment of his knee and significant synovitis that we debrided.  We then had the knee in a flexed position.  I removed remnants of the ACL, PCL, medial lateral meniscus.  We then used extramedullary guide for tibial cut correcting for varus and valgus and neutral slope.  We set up for a cut for 9 mm off the high side and made the tibial cut without the issues.  We then used the intramedullary guide for the femur and setting at 5 degrees left set for a 10 mm distal femoral cut, and we made this cut without difficulty.  We then brought the leg back down to extension.  Our extension gap was equal with a  9 mm extension block.  We then went back to the femur and used a femoral sizing guide based off the epicondylar axis.  We chose a size 4 femur.  We then put the 4 in 1 cutting block for a size 4 femur, made our anterior and posterior cuts followed by our chamfer cuts.  We then made our femoral box cut.  We then went back to the tibia and sized for a size 5 tibia.  We did our keel punch for this. With the size 5 tibial tray and a size 4 femur, we tried a 9 mm polyethylene insert.  I was then pleased with flexion-extension gaps and his range of motion overall.  We then cut our patella and drilled lugs for a size 38 patellar button.  With all components in place, again I was pleased with the stability and range of motion.  We then removed all trial components and irrigated the  knee with normal saline solution.  We used electrocautery around the knee and infiltrated the joint tissue with Exparel.  We then mixed our cement and cemented our real Stryker Triathlon tibial tray size 5.  We cemented the real size 4 femur.  We cleaned cement debris from the knee and placed the real 9 mm fix bearing polyethylene insert.  We then cemented the patellar button size 38 as well.  When the cement had hardened with the tourniquet down, hemostasis was obtained with electrocautery.  We then closed the arthrotomy with interrupted #1 Vicryl suture followed by 0-Vicryl in deep tissue, 2-0 Vicryl in subcutaneous tissue, and 4-0 Monocryl subcuticular stitch, Steri-Strips and well-padded sterile dressing was applied.  He was then taken to the recovery room in stable condition.  Of note, Erskine Emery, PA-C, assisted in the entire case, and his assistance was very crucial for completing all aspects and facilitating this case.     Jeff Rivera, M.D.     CYB/MEDQ  D:  10/27/2014  T:  10/28/2014  Job:  [id number]

## 2014-10-28 NOTE — Discharge Instructions (Addendum)
Increase your activities as comfort allows. Work on knee motion. Ice as needed for swelling. You can get your knee dressing wet daily in the shower. Leave your dressing on until your outpatient follow-up. Do take a stool softener as needed.  Information on my medicine - XARELTO (Rivaroxaban)  This medication education was reviewed with me or my healthcare representative as part of my discharge preparation.  The pharmacist that spoke with me during my hospital stay was:  Emiliano Dyer, RPH  Why was Xarelto prescribed for you? Xarelto was prescribed for you to reduce the risk of blood clots forming after orthopedic surgery. The medical term for these abnormal blood clots is venous thromboembolism (VTE).  What do you need to know about xarelto ? Take your Xarelto ONCE DAILY at the same time every day. You may take it either with or without food.  If you have difficulty swallowing the tablet whole, you may crush it and mix in applesauce just prior to taking your dose.  Take Xarelto exactly as prescribed by your doctor and DO NOT stop taking Xarelto without talking to the doctor who prescribed the medication.  Stopping without other VTE prevention medication to take the place of Xarelto may increase your risk of developing a clot.  After discharge, you should have regular check-up appointments with your healthcare provider that is prescribing your Xarelto.    What do you do if you miss a dose? If you miss a dose, take it as soon as you remember on the same day then continue your regularly scheduled once daily regimen the next day. Do not take two doses of Xarelto on the same day.   Important Safety Information A possible side effect of Xarelto is bleeding. You should call your healthcare provider right away if you experience any of the following: ? Bleeding from an injury or your nose that does not stop. ? Unusual colored urine (red or dark brown) or unusual colored stools  (red or black). ? Unusual bruising for unknown reasons. ? A serious fall or if you hit your head (even if there is no bleeding).  Some medicines may interact with Xarelto and might increase your risk of bleeding while on Xarelto. To help avoid this, consult your healthcare provider or pharmacist prior to using any new prescription or non-prescription medications, including herbals, vitamins, non-steroidal anti-inflammatory drugs (NSAIDs) and supplements.  This website has more information on Xarelto: [email or web address]   Pickup stool softener for constipation. Left lower extremity Weight Bearing as tolerated  No  Left hip precautions Progress activities slowly Expect left knee soreness and swelling. Apply heat or ice as needed. Keep left knee dressing clean and intact, may shower with dressing intact.

## 2014-10-29 LAB — CBC
HEMATOCRIT: 24.9 % — AB (ref 39.0–52.0)
HEMOGLOBIN: 8.7 g/dL — AB (ref 13.0–17.0)
MCH: 34.7 pg — ABNORMAL HIGH (ref 26.0–34.0)
MCHC: 34.9 g/dL (ref 30.0–36.0)
MCV: 99.2 fL (ref 78.0–100.0)
Platelets: 127 10*3/uL — ABNORMAL LOW (ref 150–400)
RBC: 2.51 MIL/uL — AB (ref 4.22–5.81)
RDW: 12.8 % (ref 11.5–15.5)
WBC: 7.5 10*3/uL (ref 4.0–10.5)

## 2014-10-29 MED ORDER — LIP MEDEX EX OINT
TOPICAL_OINTMENT | CUTANEOUS | Status: AC
  Filled 2014-10-29: qty 7

## 2014-10-29 NOTE — Progress Notes (Addendum)
Physical Therapy Treatment Patient Details Name: Jeff Rivera MRN: [id number] DOB: 29-May-1941 Today's Date: 10/29/2014    History of Present Illness s/p L TKA    PT Comments    Pt is requiring increased assistance. Unable to ambulate any distance on today. Difficulty processing and following commands. Inappropriate at times with comments. Do not feel pt is safe to d/c home. Will need SNF-RN/SW aware.  During session: BP 94/52, O2 90% RA, HR 87 bpm. Assisted pt back to bed and replaced Struble O2.   Follow Up Recommendations  SNF;Supervision/Assistance - 24 hour     Equipment Recommendations  None recommended by PT    Recommendations for Other Services OT consult     Precautions / Restrictions Precautions Precautions: Knee;Fall Required Braces or Orthoses: Knee Immobilizer - Left Knee Immobilizer - Left: Discontinue once straight leg raise with < 10 degree lag Restrictions Weight Bearing Restrictions: No LLE Weight Bearing: Weight bearing as tolerated    Mobility  Bed Mobility Overal bed mobility: Needs Assistance Bed Mobility: Sit to Supine       Sit to supine: Max assist;+2 for physical assistance;+2 for safety/equipment   General bed mobility comments: Assisted with trunk, L LE. Increased time. Multimodal cues for technique, hand placement, sequencing of task. Pt not processing well.   Transfers Overall transfer level: Needs assistance Equipment used: Rolling walker (2 wheeled) Transfers: Sit to/from [facility] Sit to Stand: Max assist;+2 physical assistance;+2 safety/equipment Stand pivot transfers: Max assist;+2 physical assistance;+2 safety/equipment       General transfer comment: Assist to rise, stabilize, control descent. Multimodal cues for safety, technique, hand placement. Pt is not processing well. Pt unable to stand for longer than~10-15 seconds before propping elbows on walker handles.   Ambulation/Gait             General Gait  Details: NT-pt unable   Stairs            Wheelchair Mobility    Modified Rankin (Stroke Patients Only)       Balance                                    Cognition Arousal/Alertness: Awake/alert Behavior During Therapy: WFL for tasks assessed/performed Overall Cognitive Status: Within Functional Limits for tasks assessed                      Exercises      General Comments        Pertinent Vitals/Pain Pain Assessment: Faces Faces Pain Scale: Hurts even more Pain Location: L knee Pain Intervention(s): Monitored during session;Repositioned;Ice applied    Home Living                      Prior Function            PT Goals (current goals can now be found in the care plan section) Progress towards PT goals: Not progressing toward goals - comment (Pt unable to stand long enough to pivot during this session. Not processing/following commands)    Frequency  7X/week    PT Plan Current plan remains appropriate    Co-evaluation             End of Session Equipment Utilized During Treatment: Gait belt Activity Tolerance: Patient limited by fatigue;Patient limited by pain (Limited by cognition) Patient left: in bed;with call bell/phone within reach;with family/visitor present  Time: 1607-3710 PT Time Calculation (min) (ACUTE ONLY): 17 min  Charges:  $Therapeutic Activity: 8-22 mins                    G Codes:      Weston Anna, MPT Pager: [phone]

## 2014-10-29 NOTE — Progress Notes (Addendum)
Physical Therapy Treatment Patient Details Name: Jeff Rivera MRN: [id number] DOB: 08-22-1941 Today's Date: 10/29/2014    History of Present Illness s/p L TKA    PT Comments    Pt continues to require +2 assist for mobility. Unable to ambulate on this session due to weakness, fatigue, pain. BP 100/54 once seated in recliner. At this time, recommend ST rehab at SNF (unless mobility improves signficantly). Pt is not safe to d/c home on today.  Follow Up Recommendations  SNF;Supervision/Assistance - 24 hour (unless mobility/activity tolerance improve significantly)     Equipment Recommendations  None recommended by PT    Recommendations for Other Services OT consult     Precautions / Restrictions Precautions Precautions: Knee Required Braces or Orthoses: Knee Immobilizer - Left Knee Immobilizer - Left: Discontinue once straight leg raise with < 10 degree lag Restrictions Weight Bearing Restrictions: No LLE Weight Bearing: Weight bearing as tolerated    Mobility  Bed Mobility Overal bed mobility: Needs Assistance Bed Mobility: Supine to Sit;Sit to Supine     Supine to sit: Mod assist;+2 for physical assistance;+2 for safety/equipment;HOB elevated Sit to supine: Mod assist;+2 for physical assistance;+2 for safety/equipment;HOB elevated   General bed mobility comments: Assisted with trunk, L LE. Utilized bedpad for scooting, positioning. Increased time. Multimodal cues for technique, hand placement, sequencing of task  Transfers Overall transfer level: Needs assistance Equipment used: Rolling walker (2 wheeled) Transfers: Sit to/from [facility] Sit to Stand: Mod assist;+2 physical assistance;+2 safety/equipment;From elevated surface Stand pivot transfers: Mod assist;+2 physical assistance;+2 safety/equipment       General transfer comment: Assist to rise, stabilize, control descent. Multimodal cues for safety, technique, hand placement. Stand pivot to  recliner after pt was unable to ambulate  Ambulation/Gait   Ambulation Distance (Feet): 2 Feet Assistive device: Rolling walker (2 wheeled) Gait Pattern/deviations: Trunk flexed;Decreased step length - left;Decreased step length - right;Decreased stance time - left;Antalgic;Step-to pattern     General Gait Details: Attempted ambulation however pt was unable to take beyond 2 steps with RW-limited by weakness, fatigue, pain. Assist needed to advance L LE.   Stairs            Wheelchair Mobility    Modified Rankin (Stroke Patients Only)       Balance                                    Cognition Arousal/Alertness: Awake/alert Behavior During Therapy: WFL for tasks assessed/performed Overall Cognitive Status: Within Functional Limits for tasks assessed                      Exercises Total Joint Exercises Ankle Circles/Pumps: AROM;Both;10 reps;Supine Quad Sets: AROM;Left;10 reps;Supine    General Comments        Pertinent Vitals/Pain Pain Assessment: Faces Faces Pain Scale: Hurts even more Pain Location: L knee, back Pain Descriptors / Indicators: Spasm;Sore;Sharp Pain Intervention(s): Monitored during session;Ice applied    Home Living                      Prior Function            PT Goals (current goals can now be found in the care plan section) Progress towards PT goals: Not progressing toward goals - comment (unable to ambulate this session. )    Frequency  7X/week    PT Plan Discharge plan  needs to be updated    Co-evaluation             End of Session Equipment Utilized During Treatment: Gait belt Activity Tolerance: Patient limited by fatigue;Patient limited by pain Patient left: in chair;with call bell/phone within reach     Time: 3545-6256 PT Time Calculation (min) (ACUTE ONLY): 18 min  Charges:  $Therapeutic Activity: 8-22 mins                    G Codes:      Weston Anna, MPT Pager:  [phone]

## 2014-10-29 NOTE — Progress Notes (Signed)
   Subjective:  Patient reports pain as moderate.    Objective:   VITALS:   Filed Vitals:   10/28/14 2055 10/29/14 0000 10/29/14 0400 10/29/14 0458  BP: 112/48   95/66  Pulse: 99   97  Temp: 98.1 F (36.7 C)   99.3 F (37.4 C)  TempSrc: Oral   Oral  Resp: 16 12 12 16   Height:      Weight:      SpO2: 96% 96% 90% 96%    Incision c/d/i   Lab Results  Component Value Date   WBC 7.5 10/29/2014   HGB 8.7* 10/29/2014   HCT 24.9* 10/29/2014   MCV 99.2 10/29/2014   PLT 127* 10/29/2014     Assessment/Plan:  2 Days Post-Op   - Expected postop acute blood loss anemia - will monitor for symptoms - Up with PT/OT - DVT ppx - SCDs, ambulation, xarelto - WBAT left lower extremity - Pain control - Discharge planning - possibly home today vs tomorrow - dressing changed  Marianna Payment 10/29/2014, 6:40 AM [phone]

## 2014-10-29 NOTE — Clinical Social Work Note (Signed)
CSW received a call from RN at 3pm today to say that pt was discharging today and when family arrived they changed their mind about [facility] and want a snf.   CSW met with family, son Kha, spouse, Judson Roch and pt and they are agreeable to a snf  Son Kristopher asked that he be called after you send out information and have an idea of availability as his mother is a little confused and pt (his father) is very confused.  CSW did not have time to start any of pt's paperwork due to work load and time of day given this information  CSW will follow up on Monday to provide paperwork in carefinders system  .Dede Query, LCSW [facility] Clinical Social Worker - Weekend Coverage cell #: [phone]

## 2014-10-30 ENCOUNTER — Encounter (HOSPITAL_COMMUNITY): Payer: Self-pay | Admitting: Orthopaedic Surgery

## 2014-10-30 LAB — CBC
HCT: 23.3 % — ABNORMAL LOW (ref 39.0–52.0)
Hemoglobin: 8.2 g/dL — ABNORMAL LOW (ref 13.0–17.0)
MCH: 34.7 pg — ABNORMAL HIGH (ref 26.0–34.0)
MCHC: 35.2 g/dL (ref 30.0–36.0)
MCV: 98.7 fL (ref 78.0–100.0)
Platelets: 140 10*3/uL — ABNORMAL LOW (ref 150–400)
RBC: 2.36 MIL/uL — AB (ref 4.22–5.81)
RDW: 12.7 % (ref 11.5–15.5)
WBC: 8.5 10*3/uL (ref 4.0–10.5)

## 2014-10-30 LAB — PREPARE RBC (CROSSMATCH)

## 2014-10-30 MED ORDER — SODIUM CHLORIDE 0.9 % IV SOLN: Freq: Once | INTRAVENOUS | Status: DC

## 2014-10-30 MED ORDER — HYDROCODONE-ACETAMINOPHEN 5-325 MG PO TABS
1.0000 | ORAL_TABLET | ORAL | Status: DC | PRN
  Administered 2014-10-30 – 2014-10-31 (×2): 1 via ORAL
  Administered 2014-10-31: 2 via ORAL
  Filled 2014-10-30: qty 2
  Filled 2014-10-30 (×2): qty 1

## 2014-10-30 MED ORDER — TRAMADOL HCL 50 MG PO TABS: 100.0000 mg | ORAL_TABLET | Freq: Four times a day (QID) | ORAL | Status: DC | PRN

## 2014-10-30 NOTE — Progress Notes (Signed)
Physical Therapy Treatment Patient Pt  Name: Jeff Rivera MRN: [id number] DOB: 1941-06-08 Today's Date: 10/30/2014    History of Present Illness s/p L TKA    PT Comments    Pt with decreased balance with ambulation and with increased difficulty following cues.    Follow Up Recommendations  SNF;Supervision/Assistance - 24 hour     Equipment Recommendations  None recommended by PT    Recommendations for Other Services OT consult     Precautions / Restrictions Precautions Precautions: Knee;Fall Required Braces or Orthoses: Knee Immobilizer - Left Knee Immobilizer - Left: Discontinue once straight leg raise with < 10 degree lag Restrictions LLE Weight Bearing: Weight bearing as tolerated    Mobility  Bed Mobility Overal bed mobility: Needs Assistance;+2 for physical assistance;+ 2 for safety/equipment Bed Mobility: Sit to Supine       Sit to supine: Max assist;+2 for physical assistance;+2 for safety/equipment   General bed mobility comments: Assist with LEs and with trunk.  Pt with increased difficulty following cues this pm  Transfers Overall transfer level: Needs assistance Equipment used: Rolling walker (2 wheeled) Transfers: Sit to/from Stand Sit to Stand: Max assist;+2 physical assistance Stand pivot transfers: Max assist;+2 physical assistance;+2 safety/equipment       General transfer comment: cues for LE management, use of UEs and sequencing.  Assist to rise and steady  Ambulation/Gait Ambulation/Gait assistance: Mod assist;+2 physical assistance;+2 safety/equipment Ambulation Distance (Feet): 7 Feet Assistive device: Rolling walker (2 wheeled) Gait Pattern/deviations: Step-to pattern;Decreased step length - right;Decreased step length - left;Shuffle;Trunk flexed Gait velocity: slooooow   General Gait Details: cues for posture, sequence, stride length and position from RW.  Physical assist for balance/support and to advance L LE appropriate  distance   Stairs            Wheelchair Mobility    Modified Rankin (Stroke Patients Only)       Balance                                    Cognition Arousal/Alertness: Awake/alert Behavior During Therapy: WFL for tasks assessed/performed Overall Cognitive Status: Impaired/Different from baseline Area of Impairment: Memory;Following commands;Safety/judgement       Following Commands: Follows one step commands inconsistently Safety/Judgement: Decreased awareness of safety;Decreased awareness of deficits     General Comments: family thinks confusion is related to pain meds.    Exercises      General Comments        Pertinent Vitals/Pain Pain Assessment: 0-10 Faces Pain Scale: Hurts little more Pain Location: L knee` Pain Descriptors / Indicators: Sore Pain Intervention(s): Limited activity within patient's tolerance;Monitored during session;Ice applied    Home Living                      Prior Function            PT Goals (current goals can now be found in the care plan section) Acute Rehab PT Goals Patient Stated Goal: home PT Goal Formulation: With patient Time For Goal Achievement: 11/11/14 Potential to Achieve Goals: Good Progress towards PT goals: Progressing toward goals    Frequency  7X/week    PT Plan Current plan remains appropriate    Co-evaluation             End of Session Equipment Utilized During Treatment: Gait belt;Left knee immobilizer Activity Tolerance: Patient limited by fatigue Patient left:  in bed;with call bell/phone within reach     Time: 1355-1422 PT Time Calculation (min) (ACUTE ONLY): 27 min  Charges:  $Gait Training: 8-22 mins $Therapeutic Activity: 8-22 mins                    G Codes:      [name] 2014-11-29, 2:26 PM

## 2014-10-30 NOTE — Progress Notes (Signed)
Patient ID: Jeff Rivera, male   DOB: 1941-08-24, 74 y.o.   MRN: [id number] PT is recommending SNF placement due to balance issues.  Tachy this am.  Acute blood loss anemia with hgb 8.2.  Will transfuse one unit PRBC today and let go to SNF tomorrow.

## 2014-10-30 NOTE — Clinical Social Work Placement (Signed)
Clinical Social Work Department CLINICAL SOCIAL WORK PLACEMENT NOTE 10/30/2014  Patient:  Jeff Rivera, Jeff Rivera  Account Number:  [id number] Admit date:  10/27/2014  Clinical Social Worker:  Daiva Huge  Date/time:  10/30/2014 02:03 PM  Clinical Social Work is seeking post-discharge placement for this patient at the following level of care:   SKILLED NURSING   (*CSW will update this form in Epic as items are completed)   10/30/2014  Patient/family provided with [facility] Department of Clinical Social Work's list of facilities offering this level of care within the geographic area requested by the patient (or if unable, by the patient's family).  10/30/2014  Patient/family informed of their freedom to choose among providers that offer the needed level of care, that participate in Medicare, Medicaid or managed care program needed by the patient, have an available bed and are willing to accept the patient.  10/30/2014  Patient/family informed of [facility]' ownership interest in [facility], as well as of the fact that they are under no obligation to receive care at this facility.  PASARR submitted to EDS on 10/30/2014 PASARR number received on 10/30/2014  FL2 transmitted to all facilities in geographic area requested by pt/family on  10/30/2014 FL2 transmitted to all facilities within larger geographic area on   Patient informed that his/her managed care company has contracts with or will negotiate with  certain facilities, including the following:     Patient/family informed of bed offers received:   Patient chooses bed at  Physician recommends and patient chooses bed at    Patient to be transferred to  on   Patient to be transferred to facility by  Patient and family notified of transfer on  Name of family member notified:    The following physician request were entered in Epic:   Additional Comments: Eduard Clos, MSW, [place]

## 2014-10-30 NOTE — Clinical Social Work Note (Signed)
SNF offers presented to patient, wife and son- they are now reviewing options and will advise as they make their preferred selections- anticipate possible dc tomorrow- family aware and prepared. CSW will update as final bed selection is made.  Eduard Clos, MSW, [place]

## 2014-10-30 NOTE — Progress Notes (Signed)
Physical Therapy Treatment Patient Details Name: Jeff Rivera MRN: [id number] DOB: August 04, 1941 Today's Date: 10/30/2014    History of Present Illness s/p L TKA    PT Comments    Pt progressing slowly with increased time and cueing required for all task completion.  Pt highly distractible and requires frequent cueing to stay on task.  Follow Up Recommendations  SNF;Supervision/Assistance - 24 hour     Equipment Recommendations  None recommended by PT    Recommendations for Other Services OT consult     Precautions / Restrictions Precautions Precautions: Knee;Fall Required Braces or Orthoses: Knee Immobilizer - Left Knee Immobilizer - Left: Discontinue once straight leg raise with < 10 degree lag Restrictions Weight Bearing Restrictions: No LLE Weight Bearing: Weight bearing as tolerated    Mobility  Bed Mobility               General bed mobility comments: OOB with OT  Transfers Overall transfer level: Needs assistance Equipment used: Rolling walker (2 wheeled) Transfers: Sit to/from Stand Sit to Stand: Min assist;Mod assist;+2 physical assistance;+2 safety/equipment         General transfer comment: cues for LE management, use of UEs and sequencing.  Assist to rise and steady  Ambulation/Gait Ambulation/Gait assistance: Min assist;Mod assist;+2 physical assistance;+2 safety/equipment Ambulation Distance (Feet): 18 Feet Assistive device: Rolling walker (2 wheeled) Gait Pattern/deviations: Step-to pattern;Decreased step length - right;Shuffle;Trunk flexed;Decreased stance time - left Gait velocity: slooooow   General Gait Details: cues for posture, sequence, stride length and position from RW.  Physical assist for balance/support and to advance L LE appropriate distance   Stairs            Wheelchair Mobility    Modified Rankin (Stroke Patients Only)       Balance                                    Cognition  Arousal/Alertness: Awake/alert Behavior During Therapy: WFL for tasks assessed/performed Overall Cognitive Status: Impaired/Different from baseline Area of Impairment: Memory;Following commands;Safety/judgement               General Comments: family thinks confusion is related to pain meds.    Exercises Total Joint Exercises Ankle Circles/Pumps: AROM;Both;Supine;15 reps Quad Sets: AROM;Left;Supine;15 reps Heel Slides: AAROM;Left;15 reps;Supine Straight Leg Raises: AAROM;Left;15 reps;Supine Goniometric ROM: AAROM at L knee -10- 40    General Comments        Pertinent Vitals/Pain Pain Assessment: 0-10 Faces Pain Scale: Hurts even more Pain Location: L knee Pain Descriptors / Indicators: Aching;Sore Pain Intervention(s): Limited activity within patient's tolerance;Monitored during session;Premedicated before session;Ice applied    Home Living                      Prior Function            PT Goals (current goals can now be found in the care plan section) Acute Rehab PT Goals Patient Stated Goal: to make it home soon hoepfully for the ball games PT Goal Formulation: With patient Time For Goal Achievement: 11/11/14 Potential to Achieve Goals: Good Progress towards PT goals: Progressing toward goals    Frequency  7X/week    PT Plan Current plan remains appropriate    Co-evaluation             End of Session Equipment Utilized During Treatment: Gait belt;Left knee immobilizer Activity Tolerance: Patient  tolerated treatment well Patient left: in chair;with call bell/phone within reach;with family/visitor present     Time: 1005-1046 PT Time Calculation (min) (ACUTE ONLY): 41 min  Charges:  $Gait Training: 23-37 mins $Therapeutic Exercise: 8-22 mins                    G Codes:      Autumn Gunn Nov 15, 2014, 12:52 PM

## 2014-10-30 NOTE — Evaluation (Signed)
Occupational Therapy Evaluation Patient Details Name: Jeff Rivera MRN: [id number] DOB: [birth year] Today's Date: 10/30/2014    History of Present Illness s/p L TKA   Clinical Impression   Pt. Lives at home with wife who can not provide A or S. Pt. And family are in agreement that pt. Needs ST SNF for rehab. Pt. Is currently confused and requires max cues to follow instructions. Pt. Family states that confusion is medicine related. Pt. Will need further OT to maximize pt. performance with ADLs and mobility.       SNF    Equipment Recommendations  None recommended by OT    Recommendations for Other Services       Precautions / Restrictions Precautions Precautions: Knee;Fall Required Braces or Orthoses: Knee Immobilizer - Left Knee Immobilizer - Left: Discontinue once straight leg raise with < 10 degree lag Restrictions LLE Weight Bearing: Weight bearing as tolerated      Mobility Bed Mobility Overal bed mobility: Needs Assistance;+2 for physical assistance;+ 2 for safety/equipment Bed Mobility: Supine to Sit     Supine to sit: Mod assist;+2 for physical assistance;+2 for safety/equipment        Transfers         Stand pivot transfers: Max assist;+2 physical assistance;+2 safety/equipment            Balance                                            ADL Overall ADL's : Needs assistance/impaired Eating/Feeding: Set up;Sitting   Grooming: Wash/dry hands;Wash/dry face;Supervision/safety;Set up;Sitting   Upper Body Bathing: Supervision/ safety;Set up;Sitting   Lower Body Bathing: Maximal assistance;+2 for physical assistance;Sit to/from stand   Upper Body Dressing : Supervision/safety;Set up;Sitting   Lower Body Dressing: Total assistance;+2 for physical assistance;+2 for safety/equipment;Cueing for safety;Sit to/from stand   Toilet Transfer: Maximal assistance;+2 for physical assistance;+2 for safety/equipment;Stand-pivot            Functional mobility during ADLs: Maximal assistance;+2 for physical assistance;+2 for safety/equipment General ADL Comments:  (Pt. is confused and will need further instruction on use of )     Vision                     Perception     Praxis      Pertinent Vitals/Pain Pain Assessment: 0-10 Pain Score: 6  Pain Location:  (L knee) Pain Descriptors / Indicators: Aching;Burning Pain Intervention(s): Limited activity within patient's tolerance;Monitored during session;Premedicated before session     Hand Dominance Right   Extremity/Trunk Assessment Upper Extremity Assessment Upper Extremity Assessment: Generalized weakness           Communication Communication Communication: No difficulties   Cognition Arousal/Alertness: Awake/alert Behavior During Therapy: WFL for tasks assessed/performed Overall Cognitive Status: Impaired/Different from baseline Area of Impairment: Memory;Following commands;Safety/judgement               General Comments: family thinks confusion is related to pain meds.   General Comments       Exercises       Shoulder Instructions      Home Living Family/patient expects to be discharged to:: Skilled nursing facility  Prior Functioning/Environment Level of Independence: Independent             OT Diagnosis: Generalized weakness;Acute pain   OT Problem List: Decreased activity tolerance;Decreased cognition;Decreased safety awareness;Decreased knowledge of use of DME or AE   OT Treatment/Interventions: Self-care/ADL training;DME and/or AE instruction;Therapeutic activities    OT Goals(Current goals can be found in the care plan section) Acute Rehab OT Goals Patient Stated Goal:  (get better) OT Goal Formulation: With patient Time For Goal Achievement: 11/13/14 Potential to Achieve Goals: Good ADL Goals Pt Will Perform Grooming: with supervision;standing Pt Will  Perform Lower Body Bathing: with min assist;sit to/from stand;with adaptive equipment Pt Will Perform Lower Body Dressing: with min assist;with adaptive equipment;sit to/from stand Pt Will Transfer to Toilet: with min assist;ambulating;bedside commode  OT Frequency: Min 2X/week   Barriers to D/C: Decreased caregiver support          Co-evaluation              End of Session Equipment Utilized During Treatment: Right knee immobilizer Nurse Communication: Patient requests pain meds  Activity Tolerance: Patient tolerated treatment well;Patient limited by pain Patient left: in chair;with call bell/phone within reach;with chair alarm set;with family/visitor present   Time: 0717-0800 OT Time Calculation (min): 43 min Charges:  OT General Charges $OT Visit: 1 Procedure OT Evaluation $Initial OT Evaluation Tier I: 1 Procedure OT Treatments $Self Care/Home Management : 8-22 mins $Therapeutic Activity: 23-37 mins G-Codes:    Danae Oland Nov 19, 2014, 8:16 AM

## 2014-10-30 NOTE — Clinical Social Work Note (Signed)
Clinical Social Work Department BRIEF PSYCHOSOCIAL ASSESSMENT 10/30/2014  Patient:  Jeff Rivera, Jeff Rivera     Account Number:  [id number]     Admit date:  10/27/2014  Clinical Social Worker:  Daiva Huge  Date/Time:  10/30/2014 01:54 PM  Referred by:  Physician  Date Referred:  10/29/2014 Referred for  SNF Placement   Other Referral:   Interview type:  Other - See comment Other interview type:   Per weekend CSW, patient and family were in agreement with SNF    PSYCHOSOCIAL DATA Living Status:  FAMILY Admitted from facility:   Level of care:   Primary support name:  wife, son Primary support relationship to patient:  FAMILY Degree of support available:   good    CURRENT CONCERNS Current Concerns  Post-Acute Placement   Other Concerns:    SOCIAL WORK ASSESSMENT / PLAN CSW contacted son to update on SNF offers as this was initiated over weekend. CSW confirmed with son that they do want to pursue SNF and are open to [facility] has submitted clinical updates today and pan to provide SNF offers to family for selection in anticipation of dc tomorrow-   Assessment/plan status:  Other - See comment Other assessment/ plan:   Information/referral to community resources:   SNF list    PATIENT'S/FAMILY'S RESPONSE TO PLAN OF CARE: Son reports that the wife has some memory issues and thus they prefer that we speak with him to relay information- he does report they are all in agreement with SNF search.    Eduard Clos, MSW, [place]

## 2014-10-31 DIAGNOSIS — R05 Cough: Secondary | ICD-10-CM | POA: Diagnosis not present

## 2014-10-31 DIAGNOSIS — I10 Essential (primary) hypertension: Secondary | ICD-10-CM | POA: Diagnosis not present

## 2014-10-31 DIAGNOSIS — R531 Weakness: Secondary | ICD-10-CM | POA: Diagnosis not present

## 2014-10-31 DIAGNOSIS — M6281 Muscle weakness (generalized): Secondary | ICD-10-CM | POA: Diagnosis not present

## 2014-10-31 DIAGNOSIS — D72829 Elevated white blood cell count, unspecified: Secondary | ICD-10-CM | POA: Diagnosis not present

## 2014-10-31 DIAGNOSIS — M1990 Unspecified osteoarthritis, unspecified site: Secondary | ICD-10-CM | POA: Diagnosis not present

## 2014-10-31 DIAGNOSIS — M25662 Stiffness of left knee, not elsewhere classified: Secondary | ICD-10-CM | POA: Diagnosis not present

## 2014-10-31 DIAGNOSIS — R609 Edema, unspecified: Secondary | ICD-10-CM | POA: Diagnosis not present

## 2014-10-31 DIAGNOSIS — Z471 Aftercare following joint replacement surgery: Secondary | ICD-10-CM | POA: Diagnosis not present

## 2014-10-31 DIAGNOSIS — K219 Gastro-esophageal reflux disease without esophagitis: Secondary | ICD-10-CM | POA: Diagnosis not present

## 2014-10-31 DIAGNOSIS — Z96652 Presence of left artificial knee joint: Secondary | ICD-10-CM | POA: Diagnosis not present

## 2014-10-31 DIAGNOSIS — R509 Fever, unspecified: Secondary | ICD-10-CM | POA: Diagnosis not present

## 2014-10-31 DIAGNOSIS — M25562 Pain in left knee: Secondary | ICD-10-CM | POA: Diagnosis not present

## 2014-10-31 DIAGNOSIS — E871 Hypo-osmolality and hyponatremia: Secondary | ICD-10-CM | POA: Diagnosis not present

## 2014-10-31 DIAGNOSIS — R52 Pain, unspecified: Secondary | ICD-10-CM | POA: Diagnosis not present

## 2014-10-31 DIAGNOSIS — E785 Hyperlipidemia, unspecified: Secondary | ICD-10-CM | POA: Diagnosis not present

## 2014-10-31 DIAGNOSIS — D62 Acute posthemorrhagic anemia: Secondary | ICD-10-CM | POA: Diagnosis not present

## 2014-10-31 DIAGNOSIS — M129 Arthropathy, unspecified: Secondary | ICD-10-CM | POA: Diagnosis not present

## 2014-10-31 DIAGNOSIS — R2681 Unsteadiness on feet: Secondary | ICD-10-CM | POA: Diagnosis not present

## 2014-10-31 DIAGNOSIS — I2510 Atherosclerotic heart disease of native coronary artery without angina pectoris: Secondary | ICD-10-CM | POA: Diagnosis not present

## 2014-10-31 DIAGNOSIS — K5900 Constipation, unspecified: Secondary | ICD-10-CM | POA: Diagnosis not present

## 2014-10-31 DIAGNOSIS — Z96642 Presence of left artificial hip joint: Secondary | ICD-10-CM | POA: Diagnosis not present

## 2014-10-31 LAB — TYPE AND SCREEN
ABO/RH(D): O POS
ANTIBODY SCREEN: NEGATIVE
Unit division: 0

## 2014-10-31 MED ORDER — HYDROCODONE-ACETAMINOPHEN 5-325 MG PO TABS: 1.0000 | ORAL_TABLET | ORAL | Status: DC | PRN

## 2014-10-31 NOTE — Progress Notes (Signed)
[facility] WORK PLACEMENT NOTE 10/31/2014  Patient:  Jeff Rivera, Jeff Rivera  Account Number:  [id number] Admit date:  10/27/2014  Clinical Social Worker:  Berton Mount, Latanya Presser  Date/time:  10/30/2014 02:03 PM  Clinical Social Work is seeking post-discharge placement for this patient at the following level of care:   SKILLED NURSING   (*CSW will update this form in Epic as items are completed)   10/30/2014  Patient/family provided with [facility] [place] Department of Clinical Social Work's list of facilities offering this level of care within the geographic area requested by the patient (or if unable, by the patient's family).  10/30/2014  Patient/family informed of their freedom to choose among providers that offer the needed level of care, that participate in Medicare, Medicaid or managed care program needed by the patient, have an available bed and are willing to accept the patient.  10/30/2014  Patient/family informed of [facility]' ownership interest in [facility], as well as of the fact that they are under no obligation to receive care at this facility.  PASARR submitted to EDS on 10/30/2014 PASARR number received on 10/30/2014  FL2 transmitted to all facilities in geographic area requested by pt/family on  10/30/2014 FL2 transmitted to all facilities within larger geographic area on   Patient informed that his/her managed care company has contracts with or will negotiate with  certain facilities, including the following:     Patient/family informed of bed offers received:  10/30/2014 Patient chooses bed at [facility] Physician recommends and patient chooses bed at    Patient to be transferred to [facility] on  10/31/2014 Patient to be transferred to facility by [facility] Patient and family notified of transfer on 10/31/2014 Name of family member notified:  SPOUSE / SON  The following physician request were entered in Epic:   Additional  Comments: Pt / family are in agreement with d/c to SNF today. PT recommended [facility] transport. NSG reviewed d/c summary, scripts, avs. Scripts included in d/c packet.  Werner Lean LCSW [phone]

## 2014-10-31 NOTE — Care Management Note (Signed)
    Page 1 of 1   10/31/2014     9:24:57 AM CARE MANAGEMENT NOTE 10/31/2014  Patient:  Jeff Rivera, Jeff Rivera   Account Number:  [id number]  Date Initiated:  10/28/2014  Documentation initiated by:  Apolonio Schneiders  Subjective/Objective Assessment:   Left total knee arthroplasty     Action/Plan:   discharge planning   Anticipated DC Date:  10/30/2014   Anticipated DC Plan:  [place]  CM consult      Choice offered to / List presented to:  C-1 Patient           Status of service:  Completed, signed off Medicare Important Message given?  YES (If response is "NO", the following Medicare IM given date fields will be blank) Date Medicare IM given:  10/31/2014 Medicare IM given by:  [facility] Date Additional Medicare IM given:   Additional Medicare IM given by:    Discharge Disposition:  [facility]  Per UR Regulation:    If discussed at Long Length of Stay Meetings, dates discussed:    Comments:  10/31/13 09:23 CM notes pt to go to SNF; CSW arranging.  No other CM needs were  communicated. Mariane Masters, BSN, [place].  10/28/14 1200 - CM spoke with patient. Has selected [facility] for home health PT. Has a rolling walker and 3N1 at home. Arville Go will follow patient at discharge. Venita Sheffield RN BSN CCM [phone]

## 2014-10-31 NOTE — Discharge Summary (Signed)
Patient ID: Jeff Rivera MRN: [id number] DOB/AGE: Feb 15, 1941 74 y.o.  Admit date: 10/27/2014 Discharge date: 10/31/2014  Admission Diagnoses:  Principal Problem:   Arthritis of left knee Active Problems:   Status post total left knee replacement   Discharge Diagnoses:  Same  Past Medical History  Diagnosis Date  . Hypertension   . Dyslipidemia   . CAD (coronary artery disease)   . Aortic stenosis     mild   . ED (erectile dysfunction)   . GERD (gastroesophageal reflux disease)   . Arthritis     OA    Surgeries: Procedure(s): LEFT TOTAL KNEE ARTHROPLASTY on 10/27/2014   Consultants:    Discharged Condition: Improved  Hospital Course: Jeff Rivera is an 74 y.o. male who was admitted 10/27/2014 for operative treatment ofArthritis of left knee. Patient has severe unremitting pain that affects sleep, daily activities, and work/hobbies. After pre-op clearance the patient was taken to the operating room on 10/27/2014 and underwent  Procedure(s): LEFT TOTAL KNEE ARTHROPLASTY.    Patient was given perioperative antibiotics: Anti-infectives    Start     Dose/Rate Route Frequency Ordered Stop   10/27/14 2000  ceFAZolin (ANCEF) IVPB 1 g/50 mL premix     1 g100 mL/hr over 30 Minutes Intravenous Every 6 hours 10/27/14 1745 10/28/14 0220   10/27/14 1027  ceFAZolin (ANCEF) IVPB 2 g/50 mL premix     2 g100 mL/hr over 30 Minutes Intravenous On call to O.R. 10/27/14 1027 10/27/14 1340       Patient was given sequential compression devices, early ambulation, and chemoprophylaxis to prevent DVT.  Patient benefited maximally from hospital stay and required one unit of packed fred blood cells secondary to acute blood loss anemia.    Recent vital signs: Patient Vitals for the past 24 hrs:  BP Temp Temp src Pulse Resp SpO2  10/31/14 0600 117/60 mmHg 99.2 F (37.3 C) Oral (!) 102 18 100 %  10/30/14 2101 (!) 118/46 mmHg 99.6 F (37.6 C) Oral (!) 112 18 100 %  10/30/14 1427 (!) 104/44 mmHg  100.1 F (37.8 C) Oral (!) 113 20 -  10/30/14 1150 (!) 106/48 mmHg 98.4 F (36.9 C) Oral (!) 101 18 -  10/30/14 1109 (!) 100/52 mmHg 98.2 F (36.8 C) Oral 88 18 100 %     Recent laboratory studies:  Recent Labs  10/29/14 0500 10/30/14 0521  WBC 7.5 8.5  HGB 8.7* 8.2*  HCT 24.9* 23.3*  PLT 127* 140*     Discharge Medications:     Medication List    STOP taking these medications        meloxicam 7.5 MG tablet  Commonly known as:  MOBIC      TAKE these medications        aspirin 81 MG tablet  Take 81 mg by mouth daily.     CO Q 10 PO  Take 2,000 mg by mouth daily.     Fish Oil 1200 MG Caps  Take 1 capsule by mouth daily.     fluticasone 50 MCG/ACT nasal spray  Commonly known as:  FLONASE  Place 1 spray into both nostrils daily as needed for allergies.     HYDROcodone-acetaminophen 5-325 MG per tablet  Commonly known as:  NORCO/VICODIN  Take 1-2 tablets by mouth every 4 (four) hours as needed for severe pain.     lisinopril 10 MG tablet  Commonly known as:  PRINIVIL,ZESTRIL  Take 10 mg by mouth every morning.  MAGNESIUM PO  Take 400 mg by mouth daily.     methocarbamol 500 MG tablet  Commonly known as:  ROBAXIN  Take 1 tablet (500 mg total) by mouth every 6 (six) hours as needed for muscle spasms.     niacin 500 MG tablet  Take 500 mg by mouth 2 (two) times daily with a meal.     omeprazole 20 MG capsule  Commonly known as:  PRILOSEC  Take 20 mg by mouth daily.     oxyCODONE-acetaminophen 5-325 MG per tablet  Commonly known as:  ROXICET  Take 1-2 tablets by mouth every 4 (four) hours as needed.     rivaroxaban 10 MG Tabs tablet  Commonly known as:  XARELTO  Take 1 tablet (10 mg total) by mouth daily with breakfast.     simvastatin 20 MG tablet  Commonly known as:  ZOCOR  Take 20 mg by mouth every evening.        Diagnostic Studies: Dg Chest 2 View  10/20/2014   CLINICAL DATA:  Preop, left total knee arthroplasty  EXAM: CHEST  2 VIEW   COMPARISON:  06/17/2013  FINDINGS: Cardiomediastinal silhouette is stable. Mild thoracic dextroscoliosis. No acute infiltrate or pleural effusion. No pulmonary edema.  IMPRESSION: No active cardiopulmonary disease.   Electronically Signed   By: Lahoma Crocker M.D.   On: 10/20/2014 14:54   Dg Knee Left Port  10/27/2014   CLINICAL DATA:  Initial encounter for left total knee replacement.  EXAM: PORTABLE LEFT KNEE - 1-2 VIEW  COMPARISON:  None.  FINDINGS: Two views study shows the patient to be immediately status post left total knee replacement. No evidence for immediate hardware complications. Gas in the soft tissues compatible with the immediate postoperative state.  IMPRESSION: Expected postoperative appearance in this patient status post tricompartmental knee replacement.   Electronically Signed   By: Misty Stanley M.D.   On: 10/27/2014 16:00    Disposition:       Discharge Instructions    Call MD / Call 911    Complete by:  As directed   If you experience chest pain or shortness of breath, CALL 911 and be transported to the hospital emergency room.  If you develope a fever above 101.5 F, pus (white drainage) or increased drainage or redness at the wound, or calf pain, call your surgeon's office.     Constipation Prevention    Complete by:  As directed   Drink plenty of fluids.  Prune juice may be helpful.  You may use a stool softener, such as Colace (over the counter) 100 mg twice a day.  Use MiraLax (over the counter) for constipation as needed.     Diet - low sodium heart healthy    Complete by:  As directed      Diet general    Complete by:  As directed      Discharge wound care:    Complete by:  As directed   Keep dressing clean  and intact. May shower with dressing intact.     Do not put a pillow under the knee. Place it under the heel.    Complete by:  As directed      Driving restrictions    Complete by:  As directed   No driving while taking narcotic pain meds.     Elevate  operative extremity    Complete by:  As directed   Encourage patient to wiggle toes often     Increase  activity slowly as tolerated    Complete by:  As directed      TED hose    Complete by:  As directed   Use stockings (TED hose) for 6 weeks on both leg(s).  You may remove them at night for sleeping.     Weight bearing as tolerated    Complete by:  As directed   Laterality:  left  Extremity:  Lower           Follow-up Information    Follow up with Mcarthur Rossetti, MD In 2 weeks.   Specialty:  Orthopedic Surgery   Contact information:   [place] [address] [phone]       Follow up with [facility].   Why:  Home Health Physical Therapy   Contact information:   [address] [place] [address] [phone]        Signed: Erskine Emery 10/31/2014, 8:05 AM

## 2014-10-31 NOTE — Progress Notes (Signed)
Subjective: 4 Days Post-Op Procedure(s) (LRB): LEFT TOTAL KNEE ARTHROPLASTY (Left) Patient reports pain as mild.    Objective: Vital signs in last 24 hours: Temp:  [98.2 F (36.8 C)-100.1 F (37.8 C)] 99.2 F (37.3 C) (01/19 0600) Pulse Rate:  [88-113] 102 (01/19 0600) Resp:  [18-20] 18 (01/19 0600) BP: (100-118)/(44-60) 117/60 mmHg (01/19 0600) SpO2:  [100 %] 100 % (01/19 0600)  Intake/Output from previous day: 01/18 0701 - 01/19 0700 In: 704 [P.O.:360; Blood:344] Out: 1700 [Urine:1700] Intake/Output this shift:     Recent Labs  10/29/14 0500 10/30/14 0521  HGB 8.7* 8.2*    Recent Labs  10/29/14 0500 10/30/14 0521  WBC 7.5 8.5  RBC 2.51* 2.36*  HCT 24.9* 23.3*  PLT 127* 140*   No results for input(s): NA, K, CL, CO2, BUN, CREATININE, GLUCOSE, CALCIUM in the last 72 hours. No results for input(s): LABPT, INR in the last 72 hours.  Neurovascular intact Dorsiflexion/Plantar flexion intact Incision: dressing C/D/I Compartment soft  Assessment/Plan: 4 Days Post-Op Procedure(s) (LRB): LEFT TOTAL KNEE ARTHROPLASTY (Left) Discharge to SNF  Acute blood loss anemia S/p 1 units PRBC's , asymptomatic  CLARK, GILBERT 10/31/2014, 8:03 AM

## 2014-10-31 NOTE — Progress Notes (Signed)
Physical Therapy Treatment Patient Details Name: Jeff Rivera MRN: [id number] DOB: 1941-05-23 Today's Date: 10/31/2014    History of Present Illness s/p L TKA    PT Comments    Good progress with mobility this am with pt easier to focus and following cues more consistently.  Follow Up Recommendations  SNF;Supervision/Assistance - 24 hour     Equipment Recommendations  None recommended by PT    Recommendations for Other Services OT consult     Precautions / Restrictions Precautions Precautions: Knee;Fall Required Braces or Orthoses: Knee Immobilizer - Left Knee Immobilizer - Left: Discontinue once straight leg raise with < 10 degree lag Restrictions Weight Bearing Restrictions: No LLE Weight Bearing: Weight bearing as tolerated    Mobility  Bed Mobility Overal bed mobility: Needs Assistance;+2 for physical assistance;+ 2 for safety/equipment Bed Mobility: Supine to Sit     Supine to sit: Min assist;Mod assist     General bed mobility comments: Cues for sequence and use of R LE to self assist.  Pad utilized to complete transition to EOB  Transfers Overall transfer level: Needs assistance Equipment used: Rolling walker (2 wheeled) Transfers: Sit to/from Stand Sit to Stand: Min assist;Mod assist;+2 physical assistance;From elevated surface         General transfer comment: cues for LE management, use of UEs and sequencing.  Assist to rise and steady  Ambulation/Gait Ambulation/Gait assistance: Min assist Ambulation Distance (Feet): 60 Feet (and 50') Assistive device: Rolling walker (2 wheeled) Gait Pattern/deviations: Step-to pattern;Decreased step length - right;Decreased step length - left;Shuffle;Trunk flexed Gait velocity: decr   General Gait Details: cues for sequence, posture, position from [employer]            Wheelchair Mobility    Modified Rankin (Stroke Patients Only)       Balance                                    Cognition Arousal/Alertness: Awake/alert Behavior During Therapy: WFL for tasks assessed/performed Overall Cognitive Status: Impaired/Different from baseline Area of Impairment: Memory;Following commands;Safety/judgement       Following Commands: Follows one step commands consistently Safety/Judgement: Decreased awareness of safety;Decreased awareness of deficits     General Comments: family thinks confusion is related to pain meds.    Exercises Total Joint Exercises Ankle Circles/Pumps: AROM;Both;Supine;15 reps Quad Sets: AROM;Left;Supine;15 reps Heel Slides: AAROM;Left;15 reps;Supine Straight Leg Raises: AAROM;Left;Supine;20 reps Goniometric ROM: AAROM at L knee -10 - 40    General Comments        Pertinent Vitals/Pain Pain Assessment: 0-10 Pain Score: 6  Pain Location: L knee Pain Descriptors / Indicators: Aching;Sore Pain Intervention(s): Limited activity within patient's tolerance;Monitored during session;Ice applied;Premedicated before session    Home Living                      Prior Function            PT Goals (current goals can now be found in the care plan section) Acute Rehab PT Goals Patient Stated Goal: home PT Goal Formulation: With patient Time For Goal Achievement: 11/11/14 Potential to Achieve Goals: Good Progress towards PT goals: Progressing toward goals    Frequency  7X/week    PT Plan Current plan remains appropriate    Co-evaluation             End of Session Equipment Utilized During Treatment:  Gait belt;Left knee immobilizer Activity Tolerance: Patient tolerated treatment well Patient left: in chair;with call bell/phone within reach;with family/visitor present     Time: 4917-9150 PT Time Calculation (min) (ACUTE ONLY): 31 min  Charges:  $Gait Training: 8-22 mins $Therapeutic Exercise: 8-22 mins                    G Codes:      Conny Situ 11/16/2014, 11:12 AM

## 2014-11-01 ENCOUNTER — Non-Acute Institutional Stay (SKILLED_NURSING_FACILITY): Payer: Medicare Other | Admitting: Adult Health

## 2014-11-01 ENCOUNTER — Encounter: Payer: Self-pay | Admitting: Adult Health

## 2014-11-01 ENCOUNTER — Other Ambulatory Visit: Payer: Self-pay | Admitting: *Deleted

## 2014-11-01 DIAGNOSIS — Z96652 Presence of left artificial knee joint: Secondary | ICD-10-CM

## 2014-11-01 DIAGNOSIS — K219 Gastro-esophageal reflux disease without esophagitis: Secondary | ICD-10-CM

## 2014-11-01 DIAGNOSIS — M1712 Unilateral primary osteoarthritis, left knee: Secondary | ICD-10-CM

## 2014-11-01 DIAGNOSIS — D62 Acute posthemorrhagic anemia: Secondary | ICD-10-CM | POA: Diagnosis not present

## 2014-11-01 DIAGNOSIS — I2510 Atherosclerotic heart disease of native coronary artery without angina pectoris: Secondary | ICD-10-CM | POA: Diagnosis not present

## 2014-11-01 DIAGNOSIS — M129 Arthropathy, unspecified: Secondary | ICD-10-CM | POA: Diagnosis not present

## 2014-11-01 DIAGNOSIS — I10 Essential (primary) hypertension: Secondary | ICD-10-CM | POA: Diagnosis not present

## 2014-11-01 DIAGNOSIS — E785 Hyperlipidemia, unspecified: Secondary | ICD-10-CM | POA: Diagnosis not present

## 2014-11-01 MED ORDER — HYDROCODONE-ACETAMINOPHEN 5-325 MG PO TABS: 1.0000 | ORAL_TABLET | ORAL | Status: DC | PRN

## 2014-11-01 NOTE — Telephone Encounter (Signed)
[facility] 

## 2014-11-01 NOTE — Progress Notes (Signed)
Patient ID: Jeff Rivera, male   DOB: Mar 11, 1941, 74 y.o.   MRN: [id number]   11/01/2014  Facility:  Nursing Home Location:  [place] Room Number: 1204-P LEVEL OF CARE:  SNF (31)   Chief Complaint  Patient presents with  . Hospitalization Follow-up    Osteoarthritis S/P left total knee arthroplasty, hypertension, GERD, hyperlipidemia, CAD and anemia    HISTORY OF PRESENT ILLNESS:  This is a 74 year old male who has been admitted to [facility] on 10/31/14 from [facility] with osteoarthritis S/P left total knee arthroplasty. He has past medical history open hypertension, hyperlipidemia, CAD and GERD. He has been admitted for a short-term rehabilitation.  PAST MEDICAL HISTORY:  Past Medical History  Diagnosis Date  . Hypertension   . Dyslipidemia   . CAD (coronary artery disease)   . Aortic stenosis     mild   . ED (erectile dysfunction)   . GERD (gastroesophageal reflux disease)   . Arthritis     OA    CURRENT MEDICATIONS: Reviewed per MAR/see medication list  No Known Allergies   REVIEW OF SYSTEMS:  GENERAL: no change in appetite, no fatigue, no weight changes, no fever, chills or weakness RESPIRATORY: no cough, SOB, DOE, wheezing, hemoptysis CARDIAC: no chest pain, edema or palpitations GI: no abdominal pain, diarrhea,  heart burn, nausea or vomiting, +constipation  PHYSICAL EXAMINATION  GENERAL: no acute distress, normal body habitus EYES: conjunctivae normal, sclerae normal, normal eye lids NECK: supple, trachea midline, no neck masses, no thyroid tenderness, no thyromegaly LYMPHATICS: no LAN in the neck, no supraclavicular LAN RESPIRATORY: breathing is even & unlabored, BS CTAB CARDIAC: RRR, no murmur,no extra heart sounds, no edema GI: abdomen soft, normal BS, no masses, no tenderness, no hepatomegaly, no splenomegaly EXTREMITIES: Able to move all 4 extremities PSYCHIATRIC: the patient is alert & oriented to person,  affect & behavior appropriate  LABS/RADIOLOGY: Labs reviewed: Basic Metabolic Panel:  Recent Labs  10/20/14 1200 10/28/14 0459  NA 137 137  K 4.7 4.2  CL 106 104  CO2 29 27  GLUCOSE 109* 132*  BUN 24* 26*  CREATININE 1.25 1.35  CALCIUM 10.0 8.8   CBC:  Recent Labs  10/28/14 0459 10/29/14 0500 10/30/14 0521  WBC 8.0 7.5 8.5  HGB 10.4* 8.7* 8.2*  HCT 31.1* 24.9* 23.3*  MCV 100.3* 99.2 98.7  PLT 164 127* 140*    Dg Chest 2 View  10/20/2014   CLINICAL DATA:  Preop, left total knee arthroplasty  EXAM: CHEST  2 VIEW  COMPARISON:  06/17/2013  FINDINGS: Cardiomediastinal silhouette is stable. Mild thoracic dextroscoliosis. No acute infiltrate or pleural effusion. No pulmonary edema.  IMPRESSION: No active cardiopulmonary disease.   Electronically Signed   By: Lahoma Crocker M.D.   On: 10/20/2014 14:54   Dg Knee Left Port  10/27/2014   CLINICAL DATA:  Initial encounter for left total knee replacement.  EXAM: PORTABLE LEFT KNEE - 1-2 VIEW  COMPARISON:  None.  FINDINGS: Two views study shows the patient to be immediately status post left total knee replacement. No evidence for immediate hardware complications. Gas in the soft tissues compatible with the immediate postoperative state.  IMPRESSION: Expected postoperative appearance in this patient status post tricompartmental knee replacement.   Electronically Signed   By: Misty Stanley M.D.   On: 10/27/2014 16:00    ASSESSMENT/PLAN:  Osteoarthritis S/P left total knee arthroplasty - for rehabilitation; continues Xarelto 10 mg by mouth daily  for DVT prophylaxis; continue Percocet 5/325 mg 1-2 tabs by mouth every 4 hours when necessary for pain and Robaxin 500 mg by mouth every 6 hours when necessary for muscle spasm Hypertension - well-controlled; continue lisinopril 10 mg by mouth daily GERD - stable; continue Prilosec 20 mg by mouth daily Hyperlipidemia - continue Zocor 20 mg by mouth every evening CAD - stable; continue aspirin 81 mg  by mouth daily Anemia, acute blood loss - hemoglobin 8.2; will monitor  CBC    Goals of care:  Short-term rehabilitation    Labs/test ordered:  CBC, BMP   Spent 50 minutes in patient care.     [facility], NP [employer] [phone]

## 2014-11-02 ENCOUNTER — Non-Acute Institutional Stay (SKILLED_NURSING_FACILITY): Payer: Medicare Other | Admitting: Internal Medicine

## 2014-11-02 DIAGNOSIS — E785 Hyperlipidemia, unspecified: Secondary | ICD-10-CM | POA: Diagnosis not present

## 2014-11-02 DIAGNOSIS — K219 Gastro-esophageal reflux disease without esophagitis: Secondary | ICD-10-CM

## 2014-11-02 DIAGNOSIS — I10 Essential (primary) hypertension: Secondary | ICD-10-CM | POA: Diagnosis not present

## 2014-11-02 DIAGNOSIS — D62 Acute posthemorrhagic anemia: Secondary | ICD-10-CM | POA: Diagnosis not present

## 2014-11-02 DIAGNOSIS — K5900 Constipation, unspecified: Secondary | ICD-10-CM

## 2014-11-02 DIAGNOSIS — M129 Arthropathy, unspecified: Secondary | ICD-10-CM | POA: Diagnosis not present

## 2014-11-02 DIAGNOSIS — M1712 Unilateral primary osteoarthritis, left knee: Secondary | ICD-10-CM

## 2014-11-04 ENCOUNTER — Ambulatory Visit: Payer: Self-pay

## 2014-11-04 LAB — BASIC METABOLIC PANEL
Anion Gap: 5 — ABNORMAL LOW (ref 7–16)
BUN: 30 mg/dL — ABNORMAL HIGH (ref 7–18)
CALCIUM: 8.6 mg/dL (ref 8.5–10.1)
CHLORIDE: 102 mmol/L (ref 98–107)
CREATININE: 1.5 mg/dL — AB (ref 0.60–1.30)
Co2: 28 mmol/L (ref 21–32)
EGFR (African American): 59 — ABNORMAL LOW
EGFR (Non-African Amer.): 48 — ABNORMAL LOW
Glucose: 136 mg/dL — ABNORMAL HIGH (ref 65–99)
Osmolality: 278 (ref 275–301)
Potassium: 5.6 mmol/L — ABNORMAL HIGH (ref 3.5–5.1)
Sodium: 135 mmol/L — ABNORMAL LOW (ref 136–145)

## 2014-11-04 LAB — URINALYSIS, COMPLETE
BILIRUBIN, UR: NEGATIVE
Glucose,UR: NEGATIVE mg/dL (ref 0–75)
LEUKOCYTE ESTERASE: NEGATIVE
Nitrite: NEGATIVE
Ph: 5 (ref 4.5–8.0)
Protein: 30
SQUAMOUS EPITHELIAL: NONE SEEN
Specific Gravity: 1.016 (ref 1.003–1.030)

## 2014-11-04 LAB — CBC WITH DIFFERENTIAL/PLATELET
Basophil #: 0 10*3/uL (ref 0.0–0.1)
Basophil %: 0.2 %
EOS ABS: 0 10*3/uL (ref 0.0–0.7)
EOS PCT: 0.1 %
HCT: 26.1 % — ABNORMAL LOW (ref 40.0–52.0)
HGB: 8.7 g/dL — ABNORMAL LOW (ref 13.0–18.0)
LYMPHS PCT: 3.9 %
Lymphocyte #: 0.6 10*3/uL — ABNORMAL LOW (ref 1.0–3.6)
MCH: 33.6 pg (ref 26.0–34.0)
MCHC: 33.5 g/dL (ref 32.0–36.0)
MCV: 100 fL (ref 80–100)
Monocyte #: 1 x10 3/mm (ref 0.2–1.0)
Monocyte %: 7 %
NEUTROS PCT: 88.8 %
Neutrophil #: 13.1 10*3/uL — ABNORMAL HIGH (ref 1.4–6.5)
PLATELETS: 383 10*3/uL (ref 150–440)
RBC: 2.6 10*6/uL — AB (ref 4.40–5.90)
RDW: 14.1 % (ref 11.5–14.5)
WBC: 14.8 10*3/uL — ABNORMAL HIGH (ref 3.8–10.6)

## 2014-11-06 DIAGNOSIS — Z96652 Presence of left artificial knee joint: Secondary | ICD-10-CM | POA: Diagnosis not present

## 2014-11-06 LAB — URINE CULTURE

## 2014-11-07 ENCOUNTER — Encounter: Payer: Self-pay | Admitting: Adult Health

## 2014-11-07 ENCOUNTER — Non-Acute Institutional Stay (SKILLED_NURSING_FACILITY): Payer: Medicare Other | Admitting: Adult Health

## 2014-11-07 DIAGNOSIS — E871 Hypo-osmolality and hyponatremia: Secondary | ICD-10-CM | POA: Diagnosis not present

## 2014-11-07 DIAGNOSIS — R609 Edema, unspecified: Secondary | ICD-10-CM

## 2014-11-07 DIAGNOSIS — D72829 Elevated white blood cell count, unspecified: Secondary | ICD-10-CM

## 2014-11-07 DIAGNOSIS — I10 Essential (primary) hypertension: Secondary | ICD-10-CM

## 2014-11-07 DIAGNOSIS — D62 Acute posthemorrhagic anemia: Secondary | ICD-10-CM | POA: Diagnosis not present

## 2014-11-07 NOTE — Progress Notes (Signed)
Patient ID: Jeff Rivera, male   DOB: August 17, 1941, 74 y.o.   MRN: [id number]   11/07/2014  Facility:  Nursing Home Location:  [place] Room Number: 1204-P LEVEL OF CARE:  SNF (31)   Chief Complaint  Patient presents with  . Acute Visit    Hypertension, Hyponatremia, Leukocytosis and Anemia    HISTORY OF PRESENT ILLNESS:  This is a 74 year old male who was noted to have wbc 16.8 -elevated. He was recently started on doxycycline for left knee cellulitis. He recently had Left total Knee arthroplasty. Skin However, it was discontinued due to reaction. Patient has rashes on his trunk. He does not complain of pruritus. He is now on Keflex 500 mg by mouth 3 times a day. Noted LLE edema, 2+. Xarelto was recently discontinued and currently on ASA 325 mg PO BID for DVT prophylaxis.  PAST MEDICAL HISTORY:  Past Medical History  Diagnosis Date  . Hypertension   . Dyslipidemia   . CAD (coronary artery disease)   . Aortic stenosis     mild   . ED (erectile dysfunction)   . GERD (gastroesophageal reflux disease)   . Arthritis     OA    CURRENT MEDICATIONS: Reviewed per MAR/see medication list  No Known Allergies   REVIEW OF SYSTEMS:  GENERAL: no change in appetite, no fatigue, no weight changes, no fever, chills or weakness RESPIRATORY: no cough, SOB, DOE, wheezing, hemoptysis CARDIAC: no chest pain, edema or palpitations GI: no abdominal pain, diarrhea,  heart burn, nausea or vomiting  PHYSICAL EXAMINATION  GENERAL: no acute distress, normal body habitus SKIN:  Left knee surgical incision is dry, no redness; rashes on on trunk NECK: supple, trachea midline, no neck masses, no thyroid tenderness, no thyromegaly LYMPHATICS: no LAN in the neck, no supraclavicular LAN RESPIRATORY: breathing is even & unlabored, BS CTAB CARDIAC: RRR, no murmur,no extra heart sounds, LLE edema 2+ GI: abdomen soft, normal BS, no masses, no tenderness, no hepatomegaly, no  splenomegaly EXTREMITIES: Able to move all 4 extremities PSYCHIATRIC: the patient is alert & oriented to person, affect & behavior appropriate  LABS/RADIOLOGY: 11/06/14  WBC 16.8 hemoglobin 8.0 hematocrit 24.0 MCV 99.6 sodium 130 potassium 5.1 glucose 118 BUN 53 creatinine 2.6 calcium 8.6 GFR 25.35 cholesterol 157 HDL 22 LDL 107 triglycerides 140 11/04/14  WBC 14.8 hemoglobin 8.7 hematocrit 26.1 MCV 100 glucose 136 BUN 30 creatinine 1.50 sodium 135 potassium 5.6 GFR 48 Labs reviewed: Basic Metabolic Panel:  Recent Labs  10/20/14 1200 10/28/14 0459  NA 137 137  K 4.7 4.2  CL 106 104  CO2 29 27  GLUCOSE 109* 132*  BUN 24* 26*  CREATININE 1.25 1.35  CALCIUM 10.0 8.8   CBC:  Recent Labs  10/28/14 0459 10/29/14 0500 10/30/14 0521  WBC 8.0 7.5 8.5  HGB 10.4* 8.7* 8.2*  HCT 31.1* 24.9* 23.3*  MCV 100.3* 99.2 98.7  PLT 164 127* 140*    Dg Chest 2 View  10/20/2014   CLINICAL DATA:  Preop, left total knee arthroplasty  EXAM: CHEST  2 VIEW  COMPARISON:  06/17/2013  FINDINGS: Cardiomediastinal silhouette is stable. Mild thoracic dextroscoliosis. No acute infiltrate or pleural effusion. No pulmonary edema.  IMPRESSION: No active cardiopulmonary disease.   Electronically Signed   By: Lahoma Crocker M.D.   On: 10/20/2014 14:54   Dg Knee Left Port  10/27/2014   CLINICAL DATA:  Initial encounter for left total knee replacement.  EXAM: PORTABLE LEFT KNEE -  1-2 VIEW  COMPARISON:  None.  FINDINGS: Two views study shows the patient to be immediately status post left total knee replacement. No evidence for immediate hardware complications. Gas in the soft tissues compatible with the immediate postoperative state.  IMPRESSION: Expected postoperative appearance in this patient status post tricompartmental knee replacement.   Electronically Signed   By: Misty Stanley M.D.   On: 10/27/2014 16:00    ASSESSMENT/PLAN:  Hyponatremia - start 0.9 NS @ 70 ml X 1L; BMP after Hypertension - BP 104/83 - low;  decrease Lisinopril to 5 mg by mouth daily; BP every shift 1 week Edema, LLE - venous Doppler ultrasound on left lower extremity Anemia, acute blood loss -= hgb 8.0; Xarelto was recntly discontinued; cbc in 1 week Leukocytosis - chest x-ray and repeat CBC in 1 week     [facility], NP [place]

## 2014-11-13 DIAGNOSIS — I2510 Atherosclerotic heart disease of native coronary artery without angina pectoris: Secondary | ICD-10-CM | POA: Diagnosis not present

## 2014-11-13 DIAGNOSIS — E785 Hyperlipidemia, unspecified: Secondary | ICD-10-CM | POA: Diagnosis not present

## 2014-11-13 DIAGNOSIS — I10 Essential (primary) hypertension: Secondary | ICD-10-CM | POA: Diagnosis not present

## 2014-11-13 DIAGNOSIS — D72829 Elevated white blood cell count, unspecified: Secondary | ICD-10-CM | POA: Diagnosis not present

## 2014-11-13 DIAGNOSIS — R2681 Unsteadiness on feet: Secondary | ICD-10-CM | POA: Diagnosis not present

## 2014-11-13 DIAGNOSIS — M6281 Muscle weakness (generalized): Secondary | ICD-10-CM | POA: Diagnosis not present

## 2014-11-13 DIAGNOSIS — K219 Gastro-esophageal reflux disease without esophagitis: Secondary | ICD-10-CM | POA: Diagnosis not present

## 2014-11-13 DIAGNOSIS — Z96652 Presence of left artificial knee joint: Secondary | ICD-10-CM | POA: Diagnosis not present

## 2014-11-13 DIAGNOSIS — Z471 Aftercare following joint replacement surgery: Secondary | ICD-10-CM | POA: Diagnosis not present

## 2014-11-13 DIAGNOSIS — M129 Arthropathy, unspecified: Secondary | ICD-10-CM | POA: Diagnosis not present

## 2014-11-13 DIAGNOSIS — R531 Weakness: Secondary | ICD-10-CM | POA: Diagnosis not present

## 2014-11-13 DIAGNOSIS — M25662 Stiffness of left knee, not elsewhere classified: Secondary | ICD-10-CM | POA: Diagnosis not present

## 2014-11-13 DIAGNOSIS — D62 Acute posthemorrhagic anemia: Secondary | ICD-10-CM | POA: Diagnosis not present

## 2014-11-17 ENCOUNTER — Non-Acute Institutional Stay (SKILLED_NURSING_FACILITY): Payer: Medicare Other | Admitting: Adult Health

## 2014-11-17 ENCOUNTER — Encounter: Payer: Self-pay | Admitting: Adult Health

## 2014-11-17 DIAGNOSIS — K219 Gastro-esophageal reflux disease without esophagitis: Secondary | ICD-10-CM | POA: Diagnosis not present

## 2014-11-17 DIAGNOSIS — D72829 Elevated white blood cell count, unspecified: Secondary | ICD-10-CM

## 2014-11-17 DIAGNOSIS — I10 Essential (primary) hypertension: Secondary | ICD-10-CM

## 2014-11-17 DIAGNOSIS — I2510 Atherosclerotic heart disease of native coronary artery without angina pectoris: Secondary | ICD-10-CM | POA: Diagnosis not present

## 2014-11-17 DIAGNOSIS — M129 Arthropathy, unspecified: Secondary | ICD-10-CM | POA: Diagnosis not present

## 2014-11-17 DIAGNOSIS — Z96652 Presence of left artificial knee joint: Secondary | ICD-10-CM

## 2014-11-17 DIAGNOSIS — E785 Hyperlipidemia, unspecified: Secondary | ICD-10-CM | POA: Diagnosis not present

## 2014-11-17 DIAGNOSIS — D62 Acute posthemorrhagic anemia: Secondary | ICD-10-CM | POA: Diagnosis not present

## 2014-11-17 DIAGNOSIS — M1712 Unilateral primary osteoarthritis, left knee: Secondary | ICD-10-CM

## 2014-11-18 ENCOUNTER — Other Ambulatory Visit: Payer: Self-pay | Admitting: Internal Medicine

## 2014-11-18 DIAGNOSIS — I2510 Atherosclerotic heart disease of native coronary artery without angina pectoris: Secondary | ICD-10-CM | POA: Diagnosis not present

## 2014-11-18 DIAGNOSIS — Z471 Aftercare following joint replacement surgery: Secondary | ICD-10-CM | POA: Diagnosis not present

## 2014-11-18 DIAGNOSIS — M6281 Muscle weakness (generalized): Secondary | ICD-10-CM | POA: Diagnosis not present

## 2014-11-18 DIAGNOSIS — I10 Essential (primary) hypertension: Secondary | ICD-10-CM | POA: Diagnosis not present

## 2014-11-18 DIAGNOSIS — Z96652 Presence of left artificial knee joint: Secondary | ICD-10-CM | POA: Diagnosis not present

## 2014-11-18 DIAGNOSIS — I350 Nonrheumatic aortic (valve) stenosis: Secondary | ICD-10-CM | POA: Diagnosis not present

## 2014-11-18 MED ORDER — CEPHALEXIN 500 MG PO CAPS: ORAL_CAPSULE | ORAL | Status: DC

## 2014-11-18 NOTE — Progress Notes (Signed)
Patient ID: Jeff Rivera, male   DOB: 1940-10-15, 74 y.o.   MRN: [id number]   11/17/14   Facility:  Nursing Home Location:  [place] Room Number: 1204-P LEVEL OF CARE:  SNF (31)   Chief Complaint  Patient presents with  . Discharge Note    Osteoarthritis S/P left total knee arthroplasty, hypertension, GERD, hyperlipidemia, CAD, Leukocytosis and anemia    HISTORY OF PRESENT ILLNESS:  This is a 74 year old male who is for discharge home with home health PT and OT.  He has been admitted to [facility] on 10/31/14 from [facility] with osteoarthritis S/P left total knee arthroplasty. He has past medical history open hypertension, hyperlipidemia, CAD and GERD. Patient was admitted to this facility for short-term rehabilitation after the patient's recent hospitalization.  Patient has completed SNF rehabilitation and therapy has cleared the patient for discharge.  PAST MEDICAL HISTORY:  Past Medical History  Diagnosis Date  . Hypertension   . Dyslipidemia   . CAD (coronary artery disease)   . Aortic stenosis     mild   . ED (erectile dysfunction)   . GERD (gastroesophageal reflux disease)   . Arthritis     OA    CURRENT MEDICATIONS: Reviewed per MAR/see medication list  No Known Allergies   REVIEW OF SYSTEMS:  GENERAL: no change in appetite, no fatigue, no weight changes, no fever, chills or weakness RESPIRATORY: no cough, SOB, DOE, wheezing, hemoptysis CARDIAC: no chest pain, edema or palpitations GI: no abdominal pain, diarrhea,  heart burn, nausea or vomiting, +constipation  PHYSICAL EXAMINATION  GENERAL: no acute distress, normal body habitus NECK: supple, trachea midline, no neck masses, no thyroid tenderness, no thyromegaly LYMPHATICS: no LAN in the neck, no supraclavicular LAN RESPIRATORY: breathing is even & unlabored, BS CTAB CARDIAC: RRR, no murmur,no extra heart sounds, no edema GI: abdomen soft, normal BS, no masses, no  tenderness, no hepatomegaly, no splenomegaly EXTREMITIES: Able to move all 4 extremities PSYCHIATRIC: the patient is alert & oriented to person, affect & behavior appropriate  LABS/RADIOLOGY: 11/13/14  WBC 11.2 hemoglobin 8.7 hematocrit 26.8 MCV 99.5 sodium 134 potassium 4.3 glucose 102 BUN 21 creatinine 1.29 calcium 9.4 11/06/14  WBC 16.8 hemoglobin 8.0 hematocrit 24.0 MCV 99.6 sodium 130 potassium 5.1 glucose 118 BUN 53 creatinine 2.6 calcium 8.6 GFR 25.35 cholesterol 157 HDL 22 LDL 107 triglycerides 140 11/04/14  WBC 14.8 hemoglobin 8.7 hematocrit 26.1 MCV 100 glucose 136 BUN 30 creatinine 1.50 sodium 135 potassium 5.6 GFR 48  reviewed: Basic Metabolic Panel:  Recent Labs  10/20/14 1200 10/28/14 0459  NA 137 137  K 4.7 4.2  CL 106 104  CO2 29 27  GLUCOSE 109* 132*  BUN 24* 26*  CREATININE 1.25 1.35  CALCIUM 10.0 8.8   CBC:  Recent Labs  10/28/14 0459 10/29/14 0500 10/30/14 0521  WBC 8.0 7.5 8.5  HGB 10.4* 8.7* 8.2*  HCT 31.1* 24.9* 23.3*  MCV 100.3* 99.2 98.7  PLT 164 127* 140*    Dg Chest 2 View  10/20/2014   CLINICAL DATA:  Preop, left total knee arthroplasty  EXAM: CHEST  2 VIEW  COMPARISON:  06/17/2013  FINDINGS: Cardiomediastinal silhouette is stable. Mild thoracic dextroscoliosis. No acute infiltrate or pleural effusion. No pulmonary edema.  IMPRESSION: No active cardiopulmonary disease.   Electronically Signed   By: Lahoma Crocker M.D.   On: 10/20/2014 14:54   Dg Knee Left Port  10/27/2014   CLINICAL DATA:  Initial  encounter for left total knee replacement.  EXAM: PORTABLE LEFT KNEE - 1-2 VIEW  COMPARISON:  None.  FINDINGS: Two views study shows the patient to be immediately status post left total knee replacement. No evidence for immediate hardware complications. Gas in the soft tissues compatible with the immediate postoperative state.  IMPRESSION: Expected postoperative appearance in this patient status post tricompartmental knee replacement.   Electronically Signed    By: Misty Stanley M.D.   On: 10/27/2014 16:00    ASSESSMENT/PLAN:  Osteoarthritis S/P left total knee arthroplasty - for home health PT and OT; continue Percocet 5/325 mg 1-2 tabs by mouth every 4 hours when necessary for pain and Robaxin 500 mg by mouth every 6 hours when necessary for muscle spasm Hypertension - well-controlled; continue lisinopril 5 mg by mouth daily GERD - stable; continue Prilosec 20 mg by mouth daily Hyperlipidemia - continue Zocor 20 mg by mouth every evening CAD - stable; continue aspirin 81 mg by mouth daily Anemia, acute blood loss - hemoglobin 8.7; stable Leukocytosis - wbc 11.2- trending down   I have filled out patient's discharge paperwork and written prescriptions.  Patient will receive home health PT and OT.  Total discharge time: Greater than 30 minutes  Discharge time involved coordination of the discharge process with social worker, nursing staff and therapy department. Medical justification for home health services verified.     [facility], NP [employer] [phone]

## 2014-11-20 DIAGNOSIS — Z471 Aftercare following joint replacement surgery: Secondary | ICD-10-CM | POA: Diagnosis not present

## 2014-11-20 DIAGNOSIS — I10 Essential (primary) hypertension: Secondary | ICD-10-CM | POA: Diagnosis not present

## 2014-11-20 DIAGNOSIS — I2510 Atherosclerotic heart disease of native coronary artery without angina pectoris: Secondary | ICD-10-CM | POA: Diagnosis not present

## 2014-11-20 DIAGNOSIS — M6281 Muscle weakness (generalized): Secondary | ICD-10-CM | POA: Diagnosis not present

## 2014-11-20 DIAGNOSIS — I350 Nonrheumatic aortic (valve) stenosis: Secondary | ICD-10-CM | POA: Diagnosis not present

## 2014-11-20 DIAGNOSIS — Z96652 Presence of left artificial knee joint: Secondary | ICD-10-CM | POA: Diagnosis not present

## 2014-11-22 DIAGNOSIS — Z96652 Presence of left artificial knee joint: Secondary | ICD-10-CM | POA: Diagnosis not present

## 2014-11-22 DIAGNOSIS — Z471 Aftercare following joint replacement surgery: Secondary | ICD-10-CM | POA: Diagnosis not present

## 2014-11-22 DIAGNOSIS — I10 Essential (primary) hypertension: Secondary | ICD-10-CM | POA: Diagnosis not present

## 2014-11-22 DIAGNOSIS — I350 Nonrheumatic aortic (valve) stenosis: Secondary | ICD-10-CM | POA: Diagnosis not present

## 2014-11-22 DIAGNOSIS — M6281 Muscle weakness (generalized): Secondary | ICD-10-CM | POA: Diagnosis not present

## 2014-11-22 DIAGNOSIS — I2510 Atherosclerotic heart disease of native coronary artery without angina pectoris: Secondary | ICD-10-CM | POA: Diagnosis not present

## 2014-11-24 DIAGNOSIS — I10 Essential (primary) hypertension: Secondary | ICD-10-CM | POA: Diagnosis not present

## 2014-11-24 DIAGNOSIS — Z96652 Presence of left artificial knee joint: Secondary | ICD-10-CM | POA: Diagnosis not present

## 2014-11-24 DIAGNOSIS — M6281 Muscle weakness (generalized): Secondary | ICD-10-CM | POA: Diagnosis not present

## 2014-11-24 DIAGNOSIS — I2510 Atherosclerotic heart disease of native coronary artery without angina pectoris: Secondary | ICD-10-CM | POA: Diagnosis not present

## 2014-11-24 DIAGNOSIS — Z471 Aftercare following joint replacement surgery: Secondary | ICD-10-CM | POA: Diagnosis not present

## 2014-11-24 DIAGNOSIS — I350 Nonrheumatic aortic (valve) stenosis: Secondary | ICD-10-CM | POA: Diagnosis not present

## 2014-11-28 DIAGNOSIS — Z471 Aftercare following joint replacement surgery: Secondary | ICD-10-CM | POA: Diagnosis not present

## 2014-11-28 DIAGNOSIS — I2510 Atherosclerotic heart disease of native coronary artery without angina pectoris: Secondary | ICD-10-CM | POA: Diagnosis not present

## 2014-11-28 DIAGNOSIS — M6281 Muscle weakness (generalized): Secondary | ICD-10-CM | POA: Diagnosis not present

## 2014-11-28 DIAGNOSIS — I350 Nonrheumatic aortic (valve) stenosis: Secondary | ICD-10-CM | POA: Diagnosis not present

## 2014-11-28 DIAGNOSIS — I10 Essential (primary) hypertension: Secondary | ICD-10-CM | POA: Diagnosis not present

## 2014-11-28 DIAGNOSIS — Z96652 Presence of left artificial knee joint: Secondary | ICD-10-CM | POA: Diagnosis not present

## 2014-11-29 DIAGNOSIS — M6281 Muscle weakness (generalized): Secondary | ICD-10-CM | POA: Diagnosis not present

## 2014-11-29 DIAGNOSIS — I2510 Atherosclerotic heart disease of native coronary artery without angina pectoris: Secondary | ICD-10-CM | POA: Diagnosis not present

## 2014-11-29 DIAGNOSIS — I350 Nonrheumatic aortic (valve) stenosis: Secondary | ICD-10-CM | POA: Diagnosis not present

## 2014-11-29 DIAGNOSIS — Z96652 Presence of left artificial knee joint: Secondary | ICD-10-CM | POA: Diagnosis not present

## 2014-11-29 DIAGNOSIS — Z471 Aftercare following joint replacement surgery: Secondary | ICD-10-CM | POA: Diagnosis not present

## 2014-11-29 DIAGNOSIS — I10 Essential (primary) hypertension: Secondary | ICD-10-CM | POA: Diagnosis not present

## 2014-12-01 DIAGNOSIS — I10 Essential (primary) hypertension: Secondary | ICD-10-CM | POA: Diagnosis not present

## 2014-12-01 DIAGNOSIS — I350 Nonrheumatic aortic (valve) stenosis: Secondary | ICD-10-CM | POA: Diagnosis not present

## 2014-12-01 DIAGNOSIS — I2510 Atherosclerotic heart disease of native coronary artery without angina pectoris: Secondary | ICD-10-CM | POA: Diagnosis not present

## 2014-12-01 DIAGNOSIS — Z96652 Presence of left artificial knee joint: Secondary | ICD-10-CM | POA: Diagnosis not present

## 2014-12-01 DIAGNOSIS — M6281 Muscle weakness (generalized): Secondary | ICD-10-CM | POA: Diagnosis not present

## 2014-12-01 DIAGNOSIS — Z471 Aftercare following joint replacement surgery: Secondary | ICD-10-CM | POA: Diagnosis not present

## 2014-12-04 DIAGNOSIS — Z471 Aftercare following joint replacement surgery: Secondary | ICD-10-CM | POA: Diagnosis not present

## 2014-12-04 DIAGNOSIS — I2510 Atherosclerotic heart disease of native coronary artery without angina pectoris: Secondary | ICD-10-CM | POA: Diagnosis not present

## 2014-12-04 DIAGNOSIS — Z96652 Presence of left artificial knee joint: Secondary | ICD-10-CM | POA: Diagnosis not present

## 2014-12-04 DIAGNOSIS — I10 Essential (primary) hypertension: Secondary | ICD-10-CM | POA: Diagnosis not present

## 2014-12-04 DIAGNOSIS — M6281 Muscle weakness (generalized): Secondary | ICD-10-CM | POA: Diagnosis not present

## 2014-12-04 DIAGNOSIS — I350 Nonrheumatic aortic (valve) stenosis: Secondary | ICD-10-CM | POA: Diagnosis not present

## 2014-12-06 DIAGNOSIS — M6281 Muscle weakness (generalized): Secondary | ICD-10-CM | POA: Diagnosis not present

## 2014-12-06 DIAGNOSIS — I10 Essential (primary) hypertension: Secondary | ICD-10-CM | POA: Diagnosis not present

## 2014-12-06 DIAGNOSIS — Z471 Aftercare following joint replacement surgery: Secondary | ICD-10-CM | POA: Diagnosis not present

## 2014-12-06 DIAGNOSIS — I2510 Atherosclerotic heart disease of native coronary artery without angina pectoris: Secondary | ICD-10-CM | POA: Diagnosis not present

## 2014-12-06 DIAGNOSIS — I350 Nonrheumatic aortic (valve) stenosis: Secondary | ICD-10-CM | POA: Diagnosis not present

## 2014-12-06 DIAGNOSIS — Z96652 Presence of left artificial knee joint: Secondary | ICD-10-CM | POA: Diagnosis not present

## 2014-12-11 DIAGNOSIS — Z471 Aftercare following joint replacement surgery: Secondary | ICD-10-CM | POA: Diagnosis not present

## 2014-12-11 DIAGNOSIS — Z96652 Presence of left artificial knee joint: Secondary | ICD-10-CM | POA: Diagnosis not present

## 2014-12-11 DIAGNOSIS — I350 Nonrheumatic aortic (valve) stenosis: Secondary | ICD-10-CM | POA: Diagnosis not present

## 2014-12-11 DIAGNOSIS — I10 Essential (primary) hypertension: Secondary | ICD-10-CM | POA: Diagnosis not present

## 2014-12-11 DIAGNOSIS — M6281 Muscle weakness (generalized): Secondary | ICD-10-CM | POA: Diagnosis not present

## 2014-12-11 DIAGNOSIS — I2510 Atherosclerotic heart disease of native coronary artery without angina pectoris: Secondary | ICD-10-CM | POA: Diagnosis not present

## 2014-12-13 DIAGNOSIS — I10 Essential (primary) hypertension: Secondary | ICD-10-CM | POA: Diagnosis not present

## 2014-12-13 DIAGNOSIS — I2510 Atherosclerotic heart disease of native coronary artery without angina pectoris: Secondary | ICD-10-CM | POA: Diagnosis not present

## 2014-12-13 DIAGNOSIS — Z471 Aftercare following joint replacement surgery: Secondary | ICD-10-CM | POA: Diagnosis not present

## 2014-12-13 DIAGNOSIS — M6281 Muscle weakness (generalized): Secondary | ICD-10-CM | POA: Diagnosis not present

## 2014-12-13 DIAGNOSIS — Z96652 Presence of left artificial knee joint: Secondary | ICD-10-CM | POA: Diagnosis not present

## 2014-12-13 DIAGNOSIS — I350 Nonrheumatic aortic (valve) stenosis: Secondary | ICD-10-CM | POA: Diagnosis not present

## 2014-12-21 DIAGNOSIS — R61 Generalized hyperhidrosis: Secondary | ICD-10-CM | POA: Diagnosis not present

## 2014-12-21 DIAGNOSIS — E785 Hyperlipidemia, unspecified: Secondary | ICD-10-CM | POA: Diagnosis not present

## 2014-12-24 NOTE — Progress Notes (Signed)
Patient ID: DEMPSY Rivera, male   DOB: 01-04-1941, 74 y.o.   MRN: [id number]    [facility]  Chief Complaint  Patient presents with  . New Admit To SNF   No Known Allergies  HPI 74 y/o male patient is here for STR post hospital admission from 10/27/14-10/31/14 with left knee OA. He underwent left total knee arthroplasty. He feels the pain medication is too strong for him and would like to cut down on it. He has not had a bowel movement and was started on bowel regimen here yesterday. At home he has bowel movement on daily basis. Denies any other concerns.  Review of Systems  Constitutional: Negative for fever, chills,diaphoresis.  HENT: Negative for congestion, hearing loss and sore throat.   Respiratory: Negative for cough, sputum production, shortness of breath and wheezing.   Cardiovascular: Negative for chest pain, palpitations, orthopnea and leg swelling.  Gastrointestinal: Negative for heartburn, nausea, vomiting, abdominal pain.  Genitourinary: Negative for dysuria Musculoskeletal: Negative for back pain, falls Skin: Negative for itching and rash.  Neurological: Negative for dizziness and headaches.  Psychiatric/Behavioral: Negative for depression  Past Medical History  Diagnosis Date  . Hypertension   . Dyslipidemia   . CAD (coronary artery disease)   . Aortic stenosis     mild   . ED (erectile dysfunction)   . GERD (gastroesophageal reflux disease)   . Arthritis     OA   Past Surgical History  Procedure Laterality Date  . Cardiac catheterization  01/2010    40% LAD lesion (Dr. Loni Muse. [place])   . Transthoracic echocardiogram  09/2011    EF=>55%, mild conc LVH; normal RV systolic function; LA mildly dilated, IVC borderline dilated with collapse >50%; mild mitral annular calcif & mild MR; trace TR, elevated RVSP; AV mildly sclerotic, mild calcif of AV leaflets, mild valvular AS, mild regurg; trace pulm valve regurg  . Eye surgery Bilateral  2013    LENS REPLACMENT FOR CATRACTS  . 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 Total knee arthroplasty Left 10/27/2014    Procedure: LEFT TOTAL KNEE ARTHROPLASTY;  Surgeon: Mcarthur Rossetti, MD;  Location: [facility];  Service: Orthopedics;  Laterality: Left;   Family History  Problem Relation Age of Onset  . Heart attack Father   . Coronary artery disease Father    History   Social History  . Marital Status: Married    Spouse Name: N/A  . Number of Children: 1  . Years of Education: 14   Occupational History  . Not on file.   Social History Main Topics  . Smoking status: Former Smoker    Types: Cigarettes    Quit date: 09/05/1961  . Smokeless tobacco: Never Used  . Alcohol Use: 0.5 oz/week    1 Standard drinks or equivalent per week     Comment: 3-4 GLASSES WINE PER WEEK  . Drug Use: No  . Sexual Activity: Not on file   Other Topics Concern  . Not on file   Social History Narrative   Medication reviewed. See [facility]  Physical exam BP 125/86 mmHg  Pulse 92  Temp(Src) 98 F (36.7 C)  Resp 18  SpO2 97%  GENERAL: no acute distress, normal body habitus EYES: conjunctivae normal, sclerae normal, normal eye lids NECK: supple, no thyromegaly LYMPHATICS: no cervical lymphadenopathy  RESPIRATORY: CTAB CARDIAC: RRR, no murmur, no edema GI: abdomen soft, normal BS, no tenderness EXTREMITIES: Able to  move all 4 extremities, left knee rom limited with pain PSYCHIATRIC: alert and oriented to person, place and time, normal mood  Labs reviewed  Assessment/plan  Left knee Osteoarthritis  S/P left total knee arthroplasty. Will have him work with physical therapy and occupational therapy team to help with gait training and muscle strengthening exercises.fall precautions. Skin care. Encourage to be out of bed. Continue Xarelto 10 mg daily for DVT prophylaxis. Cut down Percocet 5/325 mg 1-2 tabs by mouth from q4h prn to q6h prn. Continue prn robaxin for muscle  spasm  Constipation Bowel regimen of miralax and senna s started yesterday, no signs of bowel obstruction. monitor clinically  Blood loss anemia Monitor h&h, likely post op  Hypertension continue lisinopril 10 mg daily, monitor bmp  GERD continue Prilosec 20 mg daily  Hyperlipidemia continue Zocor 20 mg daily  Goals of care- short term rehabilitation  Labs- cbc, bmp

## 2014-12-25 DIAGNOSIS — Z111 Encounter for screening for respiratory tuberculosis: Secondary | ICD-10-CM | POA: Diagnosis not present

## 2014-12-26 DIAGNOSIS — Z96652 Presence of left artificial knee joint: Secondary | ICD-10-CM | POA: Diagnosis not present

## 2014-12-27 ENCOUNTER — Ambulatory Visit: Payer: Medicare Other | Attending: Orthopaedic Surgery | Admitting: Physical Therapy

## 2014-12-27 DIAGNOSIS — M25562 Pain in left knee: Secondary | ICD-10-CM | POA: Insufficient documentation

## 2014-12-27 DIAGNOSIS — M25462 Effusion, left knee: Secondary | ICD-10-CM

## 2014-12-27 DIAGNOSIS — M25662 Stiffness of left knee, not elsewhere classified: Secondary | ICD-10-CM | POA: Diagnosis not present

## 2014-12-27 DIAGNOSIS — R269 Unspecified abnormalities of gait and mobility: Secondary | ICD-10-CM | POA: Insufficient documentation

## 2014-12-27 DIAGNOSIS — R29898 Other symptoms and signs involving the musculoskeletal system: Secondary | ICD-10-CM | POA: Insufficient documentation

## 2014-12-27 NOTE — Patient Instructions (Signed)
   Taralyn Ferraiolo PT, DPT, LAT, ATC  [facility] Phone: [phone]     

## 2014-12-27 NOTE — Therapy (Signed)
[place], [place], [address] Phone: [phone]   Fax:  [phone]  Physical Therapy Evaluation  Patient Details  Name: Jeff Rivera MRN: [id number] Date of Birth: 18-Aug-1941 Referring Provider:  Mcarthur Rossetti*  Encounter Date: 12/27/2014      PT End of Session - 12/27/14 1507    Visit Number 1   Number of Visits 16   Date for PT Re-Evaluation 02/26/15   PT Start Time 8850   PT Stop Time 1500   PT Time Calculation (min) 45 min   Activity Tolerance Patient tolerated treatment well   Behavior During Therapy [facility] for tasks assessed/performed      Past Medical History  Diagnosis Date  . Hypertension   . Dyslipidemia   . CAD (coronary artery disease)   . Aortic stenosis     mild   . ED (erectile dysfunction)   . GERD (gastroesophageal reflux disease)   . Arthritis     OA    Past Surgical History  Procedure Laterality Date  . Cardiac catheterization  01/2010    40% LAD lesion (Dr. Loni Muse. [place])   . Transthoracic echocardiogram  09/2011    EF=>55%, mild conc LVH; normal RV systolic function; LA mildly dilated, IVC borderline dilated with collapse >50%; mild mitral annular calcif & mild MR; trace TR, elevated RVSP; AV mildly sclerotic, mild calcif of AV leaflets, mild valvular AS, mild regurg; trace pulm valve regurg  . Eye surgery Bilateral 2013    LENS REPLACMENT FOR CATRACTS  . 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 Total knee arthroplasty Left 10/27/2014    Procedure: LEFT TOTAL KNEE ARTHROPLASTY;  Surgeon: Mcarthur Rossetti, MD;  Location: [facility];  Service: Orthopedics;  Laterality: Left;    There were no vitals filed for this visit.  Visit Diagnosis:  Left knee pain - Plan: PT plan of care cert/re-cert  Knee stiffness, left - Plan: PT plan of care cert/re-cert  Decreased ROM of left knee - Plan: PT plan of care cert/re-cert  Abnormality of gait - Plan: PT plan of care  cert/re-cert  Knee swelling, left - Plan: PT plan of care cert/re-cert      Subjective Assessment - 12/27/14 1422    Symptoms pt is a 74 y.o M s/p L TKR 10/28/2014. He reports that he has been doing better and has been able to do more including walking up the steps.    Limitations Standing;Walking;Lifting   How long can you sit comfortably? 30 minutes   How long can you stand comfortably? 15-30 minutes   How long can you walk comfortably? 15- 30 minutes   Diagnostic tests x-ray  12/25/14 hardware looked good in the knee per pt report   Patient Stated Goals to be pain free, "to do all activities I used to do"   Currently in Pain? Yes   Pain Score 0-No pain  took some pain pills before coming, 2/10 after prolonged standing/ walking   Pain Location Knee   Pain Orientation Left   Pain Descriptors / Indicators Tingling   Pain Type Surgical pain   Pain Onset More than a month ago   Pain Frequency Constant   Aggravating Factors  going to bed at night, getting up in the morning it feels stiff as a board   Pain Relieving Factors bicycling, walking and moving around    Effect of Pain on Daily Activities tightness in the knee  [facility] PT Assessment - 12/27/14 1428    Assessment   Medical Diagnosis L TKR   Onset Date 11/07/14   Next MD Visit 01/19/2015   Prior Therapy home health following surgery   Precautions   Precautions None   Restrictions   Weight Bearing Restrictions No   Balance Screen   Has the patient fallen in the past 6 months No   Has the patient had a decrease in activity level because of a fear of falling?  No   Is the patient reluctant to leave their home because of a fear of falling?  No   Home Environment   Living Enviornment Private residence   Living Arrangements Spouse/significant other   Available Help at Discharge Available 24 hours/day   Type of [place] to enter   Entrance Stairs-Number of Steps 4   Entrance Stairs-Rails Can  reach both   Home Layout Two level   Alternate Level Stairs-Number of Steps 14   Alternate [place] - 2 wheels;Cane - quad   Prior Function   Level of Independence Independent with basic ADLs;Independent with homemaking with ambulation;Independent with gait;Independent with transfers   Vocation Retired   [place]   Observation/Other Assessments   Focus on Therapeutic Outcomes (FOTO)  40 % limited  projected to increased 37%    ROM / Strength   AROM / PROM / Strength AROM;PROM;Strength   AROM   AROM Assessment Site Knee   Right/Left Knee Left   Left Knee Extension -10   Left Knee Flexion 105   PROM   PROM Assessment Site Knee   Right/Left Knee Left   Left Knee Extension -8   Left Knee Flexion 110   Strength   Strength Assessment Site Knee   Right/Left Knee Left   Palpation   Palpation no tenderness noted, hypomobility with patella in all directions, and tibiofemoral joint.  Swelling @ knee 42.1 cm, 10cm above 43.2cm, 10cm below 34 cm.                    [facility] Adult PT Treatment/Exercise - 12/27/14 1502    Ambulation/Gait   Ambulation/Gait Yes   Gait Pattern Antalgic;Decreased step length - left   Posture/Postural Control   Posture/Postural Control Postural limitations   Postural Limitations Forward head;Rounded Shoulders;Weight shift right   Knee/Hip Exercises: Stretches   Passive Hamstring Stretch 2 reps;30 seconds  with towel   Hip Flexor Stretch 2 reps;30 seconds;Other (comment)  performed in supine    Knee/Hip Exercises: Supine   Heel Slides AROM;Strengthening;Left;1 set;10 reps   Bridges AROM;Strengthening;Both;1 set;5 reps   Straight Leg Raises AROM;Strengthening;Left;1 set;10 reps  with quad set                PT Education - 12/27/14 1507    Education provided Yes   Education Details evaluation findings, POC, goals, HEP   Person(s) Educated Patient   Methods Explanation    Comprehension Verbalized understanding          PT Short Term Goals - 12/27/14 1516    PT SHORT TERM GOAL #1   Title pt will be I with basic HEP (01/27/2015)   Time 4   Period Weeks   Status New   PT SHORT TERM GOAL #2   Title pt will increase Knee AROM by >10 degrees with ext and flex to assist wtih gait efficiency (01/27/2015)  Time 4   Period Weeks   Status New   PT SHORT TERM GOAL #3   Title pt will demonstrate <2/10 during and following squatting activites from a chair height to assist with getting up and down from the chair/floor (01/27/2015)   Time 4   Period Weeks   Status New   PT SHORT TERM GOAL #4   Title pt will be able to verbalize and demonstrate tehcniques to reduce L knee inflammation via RICE, and HEP (01/27/2015)   Time 4   Period Weeks   Status New           PT Long Term Goals - 2015/01/22 1522    PT LONG TERM GOAL #1   Title pt will be I with advanced HEP (02/26/2015)   Time 8   Period Weeks   PT LONG TERM GOAL #2   Title pt will increased L knee AROM to WNL compared bil to assist with gait efficency and safety (02/26/2015)   Time 8   Period Weeks   Status New   PT LONG TERM GOAL #3   Title pt will increase L knee strength to > 4/5 to assist with prolonged standing/ walking and navigating steps (02/26/2015)   Time 8   Period Weeks   Status New   PT LONG TERM GOAL #4   Title pt will be able to verbalize and demonstrate techniques to reduce risk of  L knee reinjury via postural awareness, lifting and carrying mechanics and HEP (02/26/2015)   Time 8   Period Weeks   Status New               Plan - 01/22/2015 1508    Clinical Impression Statement Pride presents to OPPT s/p L TKR with CC of left knee pain/ tightness. He demonstrates limited left knee mobility at -10 to 105 with tightness at end range and hypomobility with patellar mobilization in all planes and tibiofemoral A<>P.  he denies any tenderness upon palpation but demonstrates mild swelling  measured 10 cm above 43.2cm, @ mid joint 42.1cm, and 10cm below 34.1 cm.  He exhibits mild/mod antalgic gait due to knee stiffness. He would benefit from skilled physical therapy to maximize his function and reduce pain by addressing the deceficts listed.    Pt will benefit from skilled therapeutic intervention in order to improve on the following deficits Abnormal gait;Pain;Impaired flexibility;Decreased balance;Decreased strength;Decreased range of motion;Decreased endurance;Decreased mobility;Increased edema;Difficulty walking   Rehab Potential Good   PT Frequency 2x / week   PT Duration 8 weeks   PT Treatment/Interventions ADLs/Self Care Home Management;Electrical Stimulation;Gait training;Therapeutic exercise;Patient/family education;Balance training;Moist Heat;Neuromuscular re-education;Manual techniques;Therapeutic activities;Cryotherapy;Passive range of motion   PT Next Visit Plan modalities for pain and swelling, Knee / patellar mobilization, obtain 10 MWT   PT Home Exercise Plan bridges, quad set with SLR, heel slides, hamstring and hip flexor stretch          G-Codes - January 22, 2015 1525    Functional Assessment Tool Used FOTO   Functional Limitation Mobility: Walking and moving around   Mobility: Walking and Moving Around Current Status [phone]) At least 40 percent but less than 60 percent impaired, limited or restricted   Mobility: Walking and Moving Around Goal Status [phone]) At least 20 percent but less than 40 percent impaired, limited or restricted       Problem List Patient Active Problem List   Diagnosis Date Noted  . Arthritis of left knee 10/27/2014  . Status post total left  knee replacement 10/27/2014  . CAD (coronary artery disease) 09/12/2013  . Aortic stenosis 09/12/2013  . Dyslipidemia 09/12/2013  . HTN (hypertension) 09/12/2013  . DOE (dyspnea on exertion) 09/12/2013   Starr Lake PT, DPT, LAT, ATC  12/27/2014  3:28 PM  [facility] [address] [place], [place], [address] Phone: [phone]   Fax:  [phone]

## 2015-01-08 ENCOUNTER — Ambulatory Visit: Payer: Medicare Other | Admitting: Physical Therapy

## 2015-01-08 DIAGNOSIS — R269 Unspecified abnormalities of gait and mobility: Secondary | ICD-10-CM

## 2015-01-08 DIAGNOSIS — M25662 Stiffness of left knee, not elsewhere classified: Secondary | ICD-10-CM

## 2015-01-08 DIAGNOSIS — M25562 Pain in left knee: Secondary | ICD-10-CM

## 2015-01-08 DIAGNOSIS — M25462 Effusion, left knee: Secondary | ICD-10-CM | POA: Diagnosis not present

## 2015-01-08 DIAGNOSIS — R29898 Other symptoms and signs involving the musculoskeletal system: Secondary | ICD-10-CM | POA: Diagnosis not present

## 2015-01-08 NOTE — Therapy (Signed)
[place], [place], [address] Phone: [phone]   Fax:  [phone]  Physical Therapy Treatment  Patient Details  Name: Jeff Rivera MRN: [id number] Date of Birth: [birth year] Referring Provider:  Thressa Sheller, MD  Encounter Date: 01/08/2015      PT End of Session - 01/08/15 1741    Visit Number 2   Number of Visits 16   Date for PT Re-Evaluation 02/26/15   PT Start Time 1330   PT Stop Time 1423   PT Time Calculation (min) 53 min   Activity Tolerance Patient tolerated treatment well   Behavior During Therapy [facility] for tasks assessed/performed      Past Medical History  Diagnosis Date  . Hypertension   . Dyslipidemia   . CAD (coronary artery disease)   . Aortic stenosis     mild   . ED (erectile dysfunction)   . GERD (gastroesophageal reflux disease)   . Arthritis     OA    Past Surgical History  Procedure Laterality Date  . Cardiac catheterization  01/2010    40% LAD lesion (Dr. Loni Muse. Little)   . Transthoracic echocardiogram  09/2011    EF=>55%, mild conc LVH; normal RV systolic function; LA mildly dilated, IVC borderline dilated with collapse >50%; mild mitral annular calcif & mild MR; trace TR, elevated RVSP; AV mildly sclerotic, mild calcif of AV leaflets, mild valvular AS, mild regurg; trace pulm valve regurg  . Eye surgery Bilateral 2013    LENS REPLACMENT FOR CATRACTS  . 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 Total knee arthroplasty Left 10/27/2014    Procedure: LEFT TOTAL KNEE ARTHROPLASTY;  Surgeon: Mcarthur Rossetti, MD;  Location: [facility];  Service: Orthopedics;  Laterality: Left;    There were no vitals filed for this visit.  Visit Diagnosis:  Left knee pain  Knee stiffness, left  Decreased ROM of left knee  Abnormality of gait  Knee swelling, left      Subjective Assessment - 01/08/15 1150    Symptoms He reports a little soreness in his L knee but has  increased pain in the R low back. He reports that he has sciatica.    Currently in Pain? Yes   Pain Score 2    Pain Location Knee   Pain Orientation Left   Pain Descriptors / Indicators Tightness;Sore   Pain Type Surgical pain   Pain Onset More than a month ago   Pain Frequency Intermittent   Multiple Pain Sites Yes   Pain Score 0   Pain Location Back   Pain Orientation Right   Pain Descriptors / Indicators Aching;Sore;Tightness   Pain Type Acute pain   Pain Onset In the past 7 days   Pain Frequency Intermittent                       [facility] Adult PT Treatment/Exercise - 01/08/15 1343    Knee/Hip Exercises: Stretches   Passive Hamstring Stretch 2 reps;30 seconds   Hip Flexor Stretch 2 reps;30 seconds   Knee/Hip Exercises: Aerobic   Stationary Bike L2 x 8 min   Knee/Hip Exercises: Seated   Heel Slides AROM;Strengthening;Left;2 sets;10 reps   Knee/Hip Exercises: Supine   Quad Sets AROM;Strengthening;Left;2 sets;10 reps   Short Arc Quad Sets AROM;Strengthening;Left;2 sets;15 reps   Heel Slides AROM;Strengthening;Left;1 set;10 reps   Bridges AROM;Strengthening;Both;1 set;5 reps   Straight Leg Raises AROM;Strengthening;Left;1 set;10 reps  Modalities   Modalities Moist Heat   Moist Heat Therapy   Number Minutes Moist Heat 10 Minutes   Moist Heat Location Other (comment)  left knee   Manual Therapy   Manual Therapy Joint mobilization   Joint Mobilization knee A/P grade 2-3 oscillations                PT Education - 01/08/15 1741    Education provided Yes   Education Details HEP   Person(s) Educated Patient   Methods Explanation   Comprehension Verbalized understanding          PT Short Term Goals - 12/27/14 1516    PT SHORT TERM GOAL #1   Title pt will be I with basic HEP (01/27/2015)   Time 4   Period Weeks   Status New   PT SHORT TERM GOAL #2   Title pt will increase Knee AROM by >10 degrees with ext and flex to assist wtih gait  efficiency (01/27/2015)   Time 4   Period Weeks   Status New   PT SHORT TERM GOAL #3   Title pt will demonstrate <2/10 during and following squatting activites from a chair height to assist with getting up and down from the chair/floor (01/27/2015)   Time 4   Period Weeks   Status New   PT SHORT TERM GOAL #4   Title pt will be able to verbalize and demonstrate tehcniques to reduce L knee inflammation via RICE, and HEP (01/27/2015)   Time 4   Period Weeks   Status New           PT Long Term Goals - 12/27/14 1522    PT LONG TERM GOAL #1   Title pt will be I with advanced HEP (02/26/2015)   Time 8   Period Weeks   PT LONG TERM GOAL #2   Title pt will increased L knee AROM to WNL compared bil to assist with gait efficency and safety (02/26/2015)   Time 8   Period Weeks   Status New   PT LONG TERM GOAL #3   Title pt will increase L knee strength to > 4/5 to assist with prolonged standing/ walking and navigating steps (02/26/2015)   Time 8   Period Weeks   Status New   PT LONG TERM GOAL #4   Title pt will be able to verbalize and demonstrate techniques to reduce risk of  L knee reinjury via postural awareness, lifting and carrying mechanics and HEP (02/26/2015)   Time 8   Period Weeks   Status New               Plan - 01/08/15 1741    Clinical Impression Statement Johne continues to demonstrate tightness of the left knee with pain at end ranges which was better following stretches and the bike.  He was able to perform all exercises well without complaint.    PT Next Visit Plan modalities for pain and swelling, Knee / patellar mobilization, obtain 10 MWT   PT Home Exercise Plan bridges, quad set with SLR, heel slides, hamstring and hip flexor stretch   Consulted and Agree with Plan of Care Patient        Problem List Patient Active Problem List   Diagnosis Date Noted  . Arthritis of left knee 10/27/2014  . 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 CAD  (coronary artery disease) 09/12/2013  . Aortic stenosis 09/12/2013  . Dyslipidemia 09/12/2013  . HTN (  hypertension) 09/12/2013  . DOE (dyspnea on exertion) 09/12/2013   Starr Lake PT, DPT, LAT, ATC  01/08/2015  5:46 PM   [facility] [address] [place], [place], [address] Phone: [phone]   Fax:  [phone]

## 2015-01-10 ENCOUNTER — Ambulatory Visit: Payer: Medicare Other | Admitting: Physical Therapy

## 2015-01-10 DIAGNOSIS — R29898 Other symptoms and signs involving the musculoskeletal system: Secondary | ICD-10-CM | POA: Diagnosis not present

## 2015-01-10 DIAGNOSIS — R269 Unspecified abnormalities of gait and mobility: Secondary | ICD-10-CM | POA: Diagnosis not present

## 2015-01-10 DIAGNOSIS — M25462 Effusion, left knee: Secondary | ICD-10-CM

## 2015-01-10 DIAGNOSIS — M25562 Pain in left knee: Secondary | ICD-10-CM | POA: Diagnosis not present

## 2015-01-10 DIAGNOSIS — M25662 Stiffness of left knee, not elsewhere classified: Secondary | ICD-10-CM | POA: Diagnosis not present

## 2015-01-10 NOTE — Therapy (Signed)
[place], [place], [address] Phone: [phone]   Fax:  [phone]  Physical Therapy Treatment  Patient Details  Name: Jeff Rivera MRN: [id number] Date of Birth: 31-Aug-1941 Referring Provider:  Thressa Sheller, MD  Encounter Date: 01/10/2015      PT End of Session - 01/10/15 1336    Visit Number 3   Number of Visits 16   Date for PT Re-Evaluation 02/26/15   PT Start Time 0130   PT Stop Time 0215   PT Time Calculation (min) 45 min      Past Medical History  Diagnosis Date  . Hypertension   . Dyslipidemia   . CAD (coronary artery disease)   . Aortic stenosis     mild   . ED (erectile dysfunction)   . GERD (gastroesophageal reflux disease)   . Arthritis     OA    Past Surgical History  Procedure Laterality Date  . Cardiac catheterization  01/2010    40% LAD lesion (Dr. Loni Muse. Little)   . Transthoracic echocardiogram  09/2011    EF=>55%, mild conc LVH; normal RV systolic function; LA mildly dilated, IVC borderline dilated with collapse >50%; mild mitral annular calcif & mild MR; trace TR, elevated RVSP; AV mildly sclerotic, mild calcif of AV leaflets, mild valvular AS, mild regurg; trace pulm valve regurg  . Eye surgery Bilateral 2013    LENS REPLACMENT FOR CATRACTS  . 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 Total knee arthroplasty Left 10/27/2014    Procedure: LEFT TOTAL KNEE ARTHROPLASTY;  Surgeon: Mcarthur Rossetti, MD;  Location: [facility];  Service: Orthopedics;  Laterality: Left;    There were no vitals filed for this visit.  Visit Diagnosis:  Left knee pain  Knee stiffness, left  Decreased ROM of left knee  Abnormality of gait  Knee swelling, left      Subjective Assessment - 01/10/15 1333    Symptoms I had trouble with inclines on grass while picking up sticks. Discomfort in left knee not really pain. It is stiff every morning.   Aggravating Factors  inclines   Pain  Relieving Factors keep moving            [facility] PT Assessment - 01/10/15 1401    AROM   Left Knee Extension -6   Left Knee Flexion 115                   [facility] Adult PT Treatment/Exercise - 01/10/15 1339    Knee/Hip Exercises: Aerobic   Stationary Bike L3 x 5 min   Knee/Hip Exercises: Machines for Strengthening   Total Gym Leg Press 1 plate x20   Knee/Hip Exercises: Standing   Heel Raises 2 sets;10 reps   Knee Flexion Strengthening;Left;1 set;10 reps   Lateral Step Up Both;1 set;10 reps   Forward Step Up Left;2 sets;10 reps   Step Down Left;1 set;10 reps   SLS 4   Knee/Hip Exercises: Seated   Long Arc Quad Strengthening;Left;2 sets;10 reps   Long Arc Quad Weight 2 lbs.   Knee/Hip Exercises: Supine   Heel Slides AROM;Strengthening;Left;1 set;10 reps   Straight Leg Raises AROM;Strengthening;Left;1 set;10 reps   Modalities   Modalities Cryotherapy   Moist Heat Therapy   Number Minutes Moist Heat 10 Minutes   Moist Heat Location Other (comment)  left knee  PT Short Term Goals - 12/27/14 1516    PT SHORT TERM GOAL #1   Title pt will be I with basic HEP (01/27/2015)   Time 4   Period Weeks   Status New   PT SHORT TERM GOAL #2   Title pt will increase Knee AROM by >10 degrees with ext and flex to assist wtih gait efficiency (01/27/2015)   Time 4   Period Weeks   Status New   PT SHORT TERM GOAL #3   Title pt will demonstrate <2/10 during and following squatting activites from a chair height to assist with getting up and down from the chair/floor (01/27/2015)   Time 4   Period Weeks   Status New   PT SHORT TERM GOAL #4   Title pt will be able to verbalize and demonstrate tehcniques to reduce L knee inflammation via RICE, and HEP (01/27/2015)   Time 4   Period Weeks   Status New           PT Long Term Goals - 12/27/14 1522    PT LONG TERM GOAL #1   Title pt will be I with advanced HEP (02/26/2015)   Time 8   Period Weeks   PT  LONG TERM GOAL #2   Title pt will increased L knee AROM to WNL compared bil to assist with gait efficency and safety (02/26/2015)   Time 8   Period Weeks   Status New   PT LONG TERM GOAL #3   Title pt will increase L knee strength to > 4/5 to assist with prolonged standing/ walking and navigating steps (02/26/2015)   Time 8   Period Weeks   Status New   PT LONG TERM GOAL #4   Title pt will be able to verbalize and demonstrate techniques to reduce risk of  L knee reinjury via postural awareness, lifting and carrying mechanics and HEP (02/26/2015)   Time 8   Period Weeks   Status New               Plan - 01/10/15 1407    Clinical Impression Statement Pt tolerated standing exercies without increased pain. He does complain of increased tightness after therex today. His AROM has improve to 6-115 degres supine.    PT Next Visit Plan modalities for pain and swelling, Knee / patellar mobilization, obtain 10 MWT        Problem List Patient Active Problem List   Diagnosis Date Noted  . Arthritis of left knee 10/27/2014  . 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 CAD (coronary artery disease) 09/12/2013  . Aortic stenosis 09/12/2013  . Dyslipidemia 09/12/2013  . HTN (hypertension) 09/12/2013  . DOE (dyspnea on exertion) 09/12/2013    Dorene Ar, PTA 01/10/2015, 2:08 PM  [facility] [address] [place], [place], [address] Phone: [phone]   Fax:  [phone]

## 2015-01-15 ENCOUNTER — Ambulatory Visit: Payer: Medicare Other | Attending: Orthopaedic Surgery | Admitting: Physical Therapy

## 2015-01-15 DIAGNOSIS — R7301 Impaired fasting glucose: Secondary | ICD-10-CM | POA: Diagnosis not present

## 2015-01-15 DIAGNOSIS — M25562 Pain in left knee: Secondary | ICD-10-CM | POA: Diagnosis not present

## 2015-01-15 DIAGNOSIS — I10 Essential (primary) hypertension: Secondary | ICD-10-CM | POA: Diagnosis not present

## 2015-01-15 DIAGNOSIS — R269 Unspecified abnormalities of gait and mobility: Secondary | ICD-10-CM | POA: Diagnosis not present

## 2015-01-15 DIAGNOSIS — M25462 Effusion, left knee: Secondary | ICD-10-CM | POA: Diagnosis not present

## 2015-01-15 DIAGNOSIS — R29898 Other symptoms and signs involving the musculoskeletal system: Secondary | ICD-10-CM | POA: Insufficient documentation

## 2015-01-15 DIAGNOSIS — M25662 Stiffness of left knee, not elsewhere classified: Secondary | ICD-10-CM

## 2015-01-15 DIAGNOSIS — E785 Hyperlipidemia, unspecified: Secondary | ICD-10-CM | POA: Diagnosis not present

## 2015-01-15 DIAGNOSIS — M810 Age-related osteoporosis without current pathological fracture: Secondary | ICD-10-CM | POA: Diagnosis not present

## 2015-01-15 DIAGNOSIS — R972 Elevated prostate specific antigen [PSA]: Secondary | ICD-10-CM | POA: Diagnosis not present

## 2015-01-15 NOTE — Therapy (Signed)
Jeff [place], [place], [address] Phone: [phone]   Fax:  [phone]  Physical Therapy Treatment  Patient Details  Name: Jeff Rivera MRN: [id number] Date of Birth: April 10, 1941 Referring Provider:  Thressa Sheller, MD  Encounter Date: 01/15/2015      PT End of Session - 01/15/15 1423    Visit Number 4   Number of Visits 16   Date for PT Re-Evaluation 02/26/15   PT Start Time 1660   PT Stop Time 1510   PT Time Calculation (min) 55 min   Activity Tolerance Patient tolerated treatment well   Behavior During Therapy [facility] for tasks assessed/performed      Past Medical History  Diagnosis Date  . Hypertension   . Dyslipidemia   . CAD (coronary artery disease)   . Aortic stenosis     mild   . ED (erectile dysfunction)   . GERD (gastroesophageal reflux disease)   . Arthritis     OA    Past Surgical History  Procedure Laterality Date  . Cardiac catheterization  01/2010    40% LAD lesion (Dr. Loni Muse. [place])   . Transthoracic echocardiogram  09/2011    EF=>55%, mild conc LVH; normal RV systolic function; LA mildly dilated, IVC borderline dilated with collapse >50%; mild mitral annular calcif & mild MR; trace TR, elevated RVSP; AV mildly sclerotic, mild calcif of AV leaflets, mild valvular AS, mild regurg; trace pulm valve regurg  . Eye surgery Bilateral 2013    LENS REPLACMENT FOR CATRACTS  . 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 Total knee arthroplasty Left 10/27/2014    Procedure: LEFT TOTAL KNEE ARTHROPLASTY;  Surgeon: Mcarthur Rossetti, MD;  Location: [facility];  Service: Orthopedics;  Laterality: Left;    There were no vitals filed for this visit.  Visit Diagnosis:  Left knee pain  Knee stiffness, left  Decreased ROM of left knee  Abnormality of gait  Knee swelling, left      Subjective Assessment - 01/15/15 1419    Subjective "I am having more pain and tightness in the left knee  today." He states that he fell while walking his dog but didn't land on his L knee, but bent his L knee farther than he is used.   Currently in Pain? Yes   Pain Score 1   took pain pills before therapy   Pain Location Knee   Pain Orientation Left   Pain Descriptors / Indicators Sore;Tightness;Aching   Pain Type Surgical pain   Pain Onset More than a month ago   Pain Frequency Intermittent            [facility] PT Assessment - 01/15/15 0001    AROM   Left Knee Extension -5   Left Knee Flexion 115                   [facility] Adult PT Treatment/Exercise - 01/15/15 1421    Ambulation/Gait   Ambulation/Gait Yes   Gait Comments gait with focus on exaggerated heel strike  4 x 1ft   Knee/Hip Exercises: Stretches   Passive Hamstring Stretch 2 reps;30 seconds   Hip Flexor Stretch 2 reps;30 seconds   Knee/Hip Exercises: Aerobic   Stationary Bike L2 x 8 min  lowering seat at 4 min   Knee/Hip Exercises: Standing   Heel Raises 2 sets;10 reps   Forward Step Up Left;2 sets;10 reps;Step Height: 4"   Wall Squat  2 sets;10 reps  going until cant see feet.    Knee/Hip Exercises: Seated   Long Arc Quad AROM;Strengthening;Left;15 reps;Weights   Long Arc Quad Weight 2 lbs.   Heel Slides AROM;Strengthening;Left;2 sets;10 reps;Other (comment)  using towel for flexion   Knee/Hip Exercises: Supine   Quad Sets AROM;Strengthening;Left;2 sets;10 reps;Other (comment)  with manual overpressure   Short Arc Quad Sets --   Straight Leg Raises AROM;Strengthening;Left;10 reps;2 sets  wiht quad set   Moist Heat Therapy   Number Minutes Moist Heat 10 Minutes   Moist Heat Location Other (comment)  L knee   Manual Therapy   Manual Therapy Joint mobilization   Joint Mobilization knee A<>P grade 2-3 oscillations, patellar mobilizations grade 2-3 oscillaitons                PT Education - 01/15/15 1509    Education provided Yes   Education Details amb with exaggerated heel strike, HEP    Person(s) Educated Patient   Methods Explanation   Comprehension Verbalized understanding;Returned demonstration          PT Short Term Goals - 12/27/14 1516    PT SHORT TERM GOAL #1   Title pt will be I with basic HEP (01/27/2015)   Time 4   Period Weeks   Status New   PT SHORT TERM GOAL #2   Title pt will increase Knee AROM by >10 degrees with ext and flex to assist wtih gait efficiency (01/27/2015)   Time 4   Period Weeks   Status New   PT SHORT TERM GOAL #3   Title pt will demonstrate <2/10 during and following squatting activites from a chair height to assist with getting up and down from the chair/floor (01/27/2015)   Time 4   Period Weeks   Status New   PT SHORT TERM GOAL #4   Title pt will be able to verbalize and demonstrate tehcniques to reduce L knee inflammation via RICE, and HEP (01/27/2015)   Time 4   Period Weeks   Status New           PT Long Term Goals - 12/27/14 1522    PT LONG TERM GOAL #1   Title pt will be I with advanced HEP (02/26/2015)   Time 8   Period Weeks   PT LONG TERM GOAL #2   Title pt will increased L knee AROM to WNL compared bil to assist with gait efficency and safety (02/26/2015)   Time 8   Period Weeks   Status New   PT LONG TERM GOAL #3   Title pt will increase L knee strength to > 4/5 to assist with prolonged standing/ walking and navigating steps (02/26/2015)   Time 8   Period Weeks   Status New   PT LONG TERM GOAL #4   Title pt will be able to verbalize and demonstrate techniques to reduce risk of  L knee reinjury via postural awareness, lifting and carrying mechanics and HEP (02/26/2015)   Time 8   Period Weeks   Status New               Plan - 01/15/15 1510    Clinical Impression Statement Jeff Rivera present today with increased tightness and pain in the L knee. Following treatment he improveed L knee AROM to -4 / 121. Added prone knee bends with strap, wall squats and  calf stretch to HEP.    PT Next Visit Plan  modalities for pain and swelling,  Knee / patellar mobilization, obtain 10 MWT, progress with strengthening as tolerated   PT Home Exercise Plan prone knee bends with strap, wall squats and calf stretch   Consulted and Agree with Plan of Care Patient        Problem List Patient Active Problem List   Diagnosis Date Noted  . Arthritis of left knee 10/27/2014  . 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 CAD (coronary artery disease) 09/12/2013  . Aortic stenosis 09/12/2013  . Dyslipidemia 09/12/2013  . HTN (hypertension) 09/12/2013  . DOE (dyspnea on exertion) 09/12/2013   Starr Lake PT, DPT, LAT, ATC  01/15/2015  3:26 PM    [facility] [address] [place], [place], [address] Phone: [phone]   Fax:  [phone]

## 2015-01-17 ENCOUNTER — Ambulatory Visit: Payer: Medicare Other | Admitting: Physical Therapy

## 2015-01-17 DIAGNOSIS — M25562 Pain in left knee: Secondary | ICD-10-CM | POA: Diagnosis not present

## 2015-01-17 DIAGNOSIS — M25462 Effusion, left knee: Secondary | ICD-10-CM

## 2015-01-17 DIAGNOSIS — M25662 Stiffness of left knee, not elsewhere classified: Secondary | ICD-10-CM

## 2015-01-17 DIAGNOSIS — R269 Unspecified abnormalities of gait and mobility: Secondary | ICD-10-CM

## 2015-01-17 NOTE — Therapy (Signed)
[place], [place], [address] Phone: [phone]   Fax:  [phone]  Physical Therapy Treatment  Patient Details  Name: Jeff Rivera MRN: [id number] Date of Birth: [birth year] Referring Provider:  Thressa Sheller, MD  Encounter Date: 01/17/2015      PT End of Session - 01/17/15 1439    Visit Number 5   Number of Visits 16   Date for PT Re-Evaluation 02/26/15   PT Start Time 0130   PT Stop Time 0230   PT Time Calculation (min) 60 min      Past Medical History  Diagnosis Date  . Hypertension   . Dyslipidemia   . CAD (coronary artery disease)   . Aortic stenosis     mild   . ED (erectile dysfunction)   . GERD (gastroesophageal reflux disease)   . Arthritis     OA    Past Surgical History  Procedure Laterality Date  . Cardiac catheterization  01/2010    40% LAD lesion (Dr. Loni Muse. Little)   . Transthoracic echocardiogram  09/2011    EF=>55%, mild conc LVH; normal RV systolic function; LA mildly dilated, IVC borderline dilated with collapse >50%; mild mitral annular calcif & mild MR; trace TR, elevated RVSP; AV mildly sclerotic, mild calcif of AV leaflets, mild valvular AS, mild regurg; trace pulm valve regurg  . Eye surgery Bilateral 2013    LENS REPLACMENT FOR CATRACTS  . 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 Total knee arthroplasty Left 10/27/2014    Procedure: LEFT TOTAL KNEE ARTHROPLASTY;  Surgeon: Mcarthur Rossetti, MD;  Location: [facility];  Service: Orthopedics;  Laterality: Left;    There were no vitals filed for this visit.  Visit Diagnosis:  Knee stiffness, left  Left knee pain  Abnormality of gait  Decreased ROM of left knee  Knee swelling, left                     [facility] Adult PT Treatment/Exercise - 01/17/15 1334    Knee/Hip Exercises: Aerobic   Stationary Bike L2 x 8 min  lowering seat at 4 min   Knee/Hip Exercises: Machines for Strengthening   Cybex  Knee Extension 1 plate x 12 bilateral    Cybex Knee Flexion 2 plates bil x 30   Total Gym Leg Press 2 plates bilateral x 20 1 plate x 20   Knee/Hip Exercises: Standing   Lateral Step Up Left;10 reps;Step Height: 4"   Forward Step Up Left;10 reps   Step Down 20 reps;Hand Hold: 1;Step Height: 4"   Wall Squat 2 sets;10 reps  1 set with ball squeeze   SLS 4   Other Standing Knee Exercises SLS and tandem trials with  second best. 1 finger SLS 30 seconds bilateral   Other Standing Knee Exercises Resisted gait 2 plates cues for increased step length. 5 times each way,   Knee/Hip Exercises: Supine   Straight Leg Raises Strengthening;Left;2 sets;10 reps   Modalities   Modalities --   Moist Heat Therapy   Number Minutes Moist Heat --   Cryotherapy   Number Minutes Cryotherapy 15 Minutes   Cryotherapy Location Knee   Type of Cryotherapy Ice pack                  PT Short Term Goals - 01/17/15 1420    PT SHORT TERM GOAL #1   Title pt will be I with  basic HEP (01/27/2015)   Time 4   Period Weeks   Status Achieved   PT SHORT TERM GOAL #2   Title pt will increase Knee AROM by >10 degrees with ext and flex to assist wtih gait efficiency (01/27/2015)   Time 4   Period Weeks   Status Partially Met  improved knee flexion to 115   PT SHORT TERM GOAL #3   Title pt will demonstrate <2/10 during and following squatting activites from a chair height to assist with getting up and down from the chair/floor (01/27/2015)   Time 4   Period Weeks   Status On-going   PT SHORT TERM GOAL #4   Title pt will be able to verbalize and demonstrate tehcniques to reduce L knee inflammation via RICE, and HEP (01/27/2015)   Time 4   Period Weeks   Status Achieved           PT Long Term Goals - 01/17/15 1421    PT LONG TERM GOAL #1   Title pt will be I with advanced HEP (02/26/2015)   Time 8   Period Weeks   Status On-going   PT LONG TERM GOAL #2   Title pt will increased L knee AROM to WNL  compared bil to assist with gait efficency and safety (02/26/2015)   Time 8   Period Weeks   Status On-going   PT LONG TERM GOAL #3   Title pt will increase L knee strength to > 4/5 to assist with prolonged standing/ walking and navigating steps (02/26/2015)   Time 8   Period Weeks   Status On-going   PT LONG TERM GOAL #4   Title pt will be able to verbalize and demonstrate techniques to reduce risk of  L knee reinjury via postural awareness, lifting and carrying mechanics and HEP (02/26/2015)   Time 8   Period Weeks   Status On-going               Plan - 01/17/15 1431    Clinical Impression Statement STG#1,4 MET STG# 2 partially met. Pt demonstrates safety with reciprocal stairs with 1 HR. He reports continued difficulty with inclines and declines, especially with unlevel surfaces such as grass.    PT Next Visit Plan modalities for pain and swelling, Knee / patellar mobilization, progress with strengthening as tolerated, CHECK STRENGTH/ROM           Problem List Patient Active Problem List   Diagnosis Date Noted  . Arthritis of left knee 10/27/2014  . 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 CAD (coronary artery disease) 09/12/2013  . Aortic stenosis 09/12/2013  . Dyslipidemia 09/12/2013  . HTN (hypertension) 09/12/2013  . DOE (dyspnea on exertion) 09/12/2013    Dorene Ar, PTA 01/17/2015, 2:43 PM  [facility] [address] [place], [place], [address] Phone: [phone]   Fax:  [phone]

## 2015-01-22 ENCOUNTER — Ambulatory Visit: Payer: Medicare Other | Admitting: Physical Therapy

## 2015-01-22 DIAGNOSIS — M25562 Pain in left knee: Secondary | ICD-10-CM | POA: Diagnosis not present

## 2015-01-22 DIAGNOSIS — M25662 Stiffness of left knee, not elsewhere classified: Secondary | ICD-10-CM

## 2015-01-22 DIAGNOSIS — I10 Essential (primary) hypertension: Secondary | ICD-10-CM | POA: Diagnosis not present

## 2015-01-22 DIAGNOSIS — N183 Chronic kidney disease, stage 3 (moderate): Secondary | ICD-10-CM | POA: Diagnosis not present

## 2015-01-22 DIAGNOSIS — R269 Unspecified abnormalities of gait and mobility: Secondary | ICD-10-CM

## 2015-01-22 DIAGNOSIS — M25462 Effusion, left knee: Secondary | ICD-10-CM

## 2015-01-22 DIAGNOSIS — R7301 Impaired fasting glucose: Secondary | ICD-10-CM | POA: Diagnosis not present

## 2015-01-22 DIAGNOSIS — E785 Hyperlipidemia, unspecified: Secondary | ICD-10-CM | POA: Diagnosis not present

## 2015-01-22 NOTE — Patient Instructions (Signed)
  Airyonna Franklyn PT, DPT, LAT, ATC  01/22/2015  2:25 PM

## 2015-01-22 NOTE — Therapy (Signed)
[place], [place], [address] Phone: [phone]   Fax:  [phone]  Physical Therapy Treatment  Patient Details  Name: Jeff Rivera MRN: [id number] Date of Birth: 1941-05-17 Referring Provider:  Thressa Sheller, MD  Encounter Date: 01/22/2015      PT End of Session - 01/22/15 1430    Visit Number 6   Number of Visits 16   Date for PT Re-Evaluation 02/26/15   PT Start Time 0130   PT Stop Time 0232   PT Time Calculation (min) 62 min   Activity Tolerance Patient tolerated treatment well   Behavior During Therapy [facility] for tasks assessed/performed      Past Medical History  Diagnosis Date  . Hypertension   . Dyslipidemia   . CAD (coronary artery disease)   . Aortic stenosis     mild   . ED (erectile dysfunction)   . GERD (gastroesophageal reflux disease)   . Arthritis     OA    Past Surgical History  Procedure Laterality Date  . Cardiac catheterization  01/2010    40% LAD lesion (Dr. Loni Muse. Little)   . Transthoracic echocardiogram  09/2011    EF=>55%, mild conc LVH; normal RV systolic function; LA mildly dilated, IVC borderline dilated with collapse >50%; mild mitral annular calcif & mild MR; trace TR, elevated RVSP; AV mildly sclerotic, mild calcif of AV leaflets, mild valvular AS, mild regurg; trace pulm valve regurg  . Eye surgery Bilateral 2013    LENS REPLACMENT FOR CATRACTS  . 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 Total knee arthroplasty Left 10/27/2014    Procedure: LEFT TOTAL KNEE ARTHROPLASTY;  Surgeon: Mcarthur Rossetti, MD;  Location: [facility];  Service: Orthopedics;  Laterality: Left;    There were no vitals filed for this visit.  Visit Diagnosis:  Left knee pain  Knee stiffness, left  Abnormality of gait  Decreased ROM of left knee  Knee swelling, left      Subjective Assessment - 01/22/15 1337    Subjective " I felt like my knee fell out over the weekend, I was  really sore"  I feel sore following the last visit, but felt like it loosened up after.    Currently in Pain? Yes   Pain Score 2   took pain medicatoin 1 hour prior to tx.   Pain Orientation Left   Pain Descriptors / Indicators Aching;Sore;Tightness            [facility] PT Assessment - 01/22/15 0001    AROM   Left Knee Extension -3  after tx   Left Knee Flexion 122  After tx   Strength   Right Knee Flexion 4/5   Right Knee Extension 4/5   Left Knee Flexion 4-/5   Left Knee Extension 4-/5                   [facility] Adult PT Treatment/Exercise - 01/22/15 0001    Knee/Hip Exercises: Stretches   Passive Hamstring Stretch 3 reps;30 seconds  pnf contract relax technique with 10 sec contraction   Hip Flexor Stretch 2 reps;30 seconds   Knee/Hip Exercises: Aerobic   Stationary Bike L2 x 8 min  moving seated closer at 4 min   Knee/Hip Exercises: Machines for Strengthening   Cybex Knee Extension 1 plate x 15 bilateral   contract with both, eccentric with LLE only   Cybex Knee Flexion 2 plates bil  x 15  contract with both, eccentric with LLE only   Knee/Hip Exercises: Standing   SLS LLE x 4  best time was 8 sec with mod-significant postural sway   Other Standing Knee Exercises alt toe taps on 6 inch step x 15  difficulty with balance demostrating mod postural sway   Knee/Hip Exercises: Supine   Quad Sets AROM;Strengthening;Left;2 sets;10 reps;Other (comment)  with manual overpressure to assist with extension   Modalities   Modalities Cryotherapy   Cryotherapy   Number Minutes Cryotherapy 15 Minutes   Cryotherapy Location Knee   Type of Cryotherapy Other (comment)  vasopnuematic compression   Manual Therapy   Manual Therapy Joint mobilization   Joint Mobilization knee A<>P grade 3-4 oscillations, patellar mobilizations grade 3 oscillaitons                PT Education - 01/22/15 1429    Education provided Yes   Education Details tandem balance with chair in  corner added to hep   Person(s) Educated Patient   Methods Explanation   Comprehension Verbalized understanding          PT Short Term Goals - 01/22/15 1447    PT SHORT TERM GOAL #1   Title pt will be I with basic HEP (01/27/2015)   Time 4   Period Weeks   Status Achieved   PT SHORT TERM GOAL #2   Title pt will increase Knee AROM by >10 degrees with ext and flex to assist wtih gait efficiency (01/27/2015)   Time 4   Period Weeks   Status Achieved   PT SHORT TERM GOAL #3   Title pt will demonstrate <2/10 during and following squatting activites from a chair height to assist with getting up and down from the chair/floor (01/27/2015)   Time 4   Period Weeks   Status On-going   PT SHORT TERM GOAL #4   Title pt will be able to verbalize and demonstrate tehcniques to reduce L knee inflammation via RICE, and HEP (01/27/2015)   Time 4   Period Weeks   Status Achieved           PT Long Term Goals - 01/17/15 1421    PT LONG TERM GOAL #1   Title pt will be I with advanced HEP (02/26/2015)   Time 8   Period Weeks   Status On-going   PT LONG TERM GOAL #2   Title pt will increased L knee AROM to WNL compared bil to assist with gait efficency and safety (02/26/2015)   Time 8   Period Weeks   Status On-going   PT LONG TERM GOAL #3   Title pt will increase L knee strength to > 4/5 to assist with prolonged standing/ walking and navigating steps (02/26/2015)   Time 8   Period Weeks   Status On-going   PT LONG TERM GOAL #4   Title pt will be able to verbalize and demonstrate techniques to reduce risk of  L knee reinjury via postural awareness, lifting and carrying mechanics and HEP (02/26/2015)   Time 8   Period Weeks   Status On-going               Plan - 01/22/15 1430    Clinical Impression Statement Roniel Met short term goal #2 today getting knee flexion to 122 (initial was 105) and ext to -3 following treatment and continues to make progress with strength in the L knee. He  continues to demonstrate difficulty during  Single limb support during static and dynamic activities (toe taps on step). Added corner tandem balance with chair to his HEP to help progress balance .    PT Next Visit Plan modalities for pain and swelling, Knee / patellar mobilization, progress with strengthening as tolerated   PT Home Exercise Plan tandem balance with chair in corner    Consulted and Agree with Plan of Care Patient        Problem List Patient Active Problem List   Diagnosis Date Noted  . Arthritis of left knee 10/27/2014  . 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 CAD (coronary artery disease) 09/12/2013  . Aortic stenosis 09/12/2013  . Dyslipidemia 09/12/2013  . HTN (hypertension) 09/12/2013  . DOE (dyspnea on exertion) 09/12/2013   Starr Lake PT, DPT, LAT, ATC  01/22/2015  2:51 PM   [facility] [address] [place], [place], [address] Phone: [phone]   Fax:  [phone]

## 2015-01-24 ENCOUNTER — Ambulatory Visit: Payer: Medicare Other | Admitting: Physical Therapy

## 2015-01-24 DIAGNOSIS — M25662 Stiffness of left knee, not elsewhere classified: Secondary | ICD-10-CM

## 2015-01-24 DIAGNOSIS — M25462 Effusion, left knee: Secondary | ICD-10-CM

## 2015-01-24 DIAGNOSIS — R269 Unspecified abnormalities of gait and mobility: Secondary | ICD-10-CM

## 2015-01-24 DIAGNOSIS — M25562 Pain in left knee: Secondary | ICD-10-CM

## 2015-01-24 NOTE — Therapy (Signed)
[place], [place], [address] Phone: [phone]   Fax:  [phone]  Physical Therapy Treatment  Patient Details  Name: Jeff Rivera MRN: [id number] Date of Birth: [birth year] Referring Provider:  Thressa Sheller, MD  Encounter Date: 01/24/2015      PT End of Session - 01/24/15 1334    Visit Number 7   Number of Visits 16   Date for PT Re-Evaluation 02/26/15   PT Start Time 0130   PT Stop Time 0235   PT Time Calculation (min) 65 min      Past Medical History  Diagnosis Date  . Hypertension   . Dyslipidemia   . CAD (coronary artery disease)   . Aortic stenosis     mild   . ED (erectile dysfunction)   . GERD (gastroesophageal reflux disease)   . Arthritis     OA    Past Surgical History  Procedure Laterality Date  . Cardiac catheterization  01/2010    40% LAD lesion (Dr. Loni Muse. Little)   . Transthoracic echocardiogram  09/2011    EF=>55%, mild conc LVH; normal RV systolic function; LA mildly dilated, IVC borderline dilated with collapse >50%; mild mitral annular calcif & mild MR; trace TR, elevated RVSP; AV mildly sclerotic, mild calcif of AV leaflets, mild valvular AS, mild regurg; trace pulm valve regurg  . Eye surgery Bilateral 2013    LENS REPLACMENT FOR CATRACTS  . 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 Total knee arthroplasty Left 10/27/2014    Procedure: LEFT TOTAL KNEE ARTHROPLASTY;  Surgeon: Mcarthur Rossetti, MD;  Location: [facility];  Service: Orthopedics;  Laterality: Left;    There were no vitals filed for this visit.  Visit Diagnosis:  Knee stiffness, left  Left knee pain  Abnormality of gait  Decreased ROM of left knee  Knee swelling, left      Subjective Assessment - 01/24/15 1332    Subjective "I feel like there is something wrong with my knee...there is a space and it's not right.  I see the surgeon on Monday.  My knee was sore after the last treatment on  Monday.  I took my pain pill before I came to treatment.  I am still having a little bit of balance trouble/uneasiness when I get up in the morning.  I feel like I'm going to fall"   Currently in Pain? No/denies   Pain Location --                       [facility] Adult PT Treatment/Exercise - 01/24/15 1331    Knee/Hip Exercises: Aerobic   Stationary Bike L2 x 8 min   Knee/Hip Exercises: Machines for Strengthening   Cybex Knee Extension 2 plates x 10 bilateral    Cybex Knee Flexion 2 plates bil x 15; 2 plates left only X 10; 3 plates bilateral X 15,  contract with both, eccentric with LLE only   Total Gym Leg Press 2 plates bilateral x 10 1 plate x 20    Knee/Hip Exercises: Standing   Lateral Step Up Left;15 reps;Hand Hold: 1;Step Height: 6";Other (comment)  cues for control and speed   Forward Step Up Left;20 reps;Hand Hold: 1;Step Height: 6"   Step Down Left;15 reps;Hand Hold: 1;Limitations;Step Height: 6"  cues for control; pt. demonstrated LOB   Other Standing Knee Exercises SLS and tandem trials with 7  second best. 1-2 finger  support; foam pad   Modalities   Modalities Cryotherapy   Cryotherapy   Number Minutes Cryotherapy 15 Minutes   Cryotherapy Location Knee   Type of Cryotherapy Ice pack                  PT Short Term Goals - 01/22/15 1447    PT SHORT TERM GOAL #1   Title pt will be I with basic HEP (01/27/2015)   Time 4   Period Weeks   Status Achieved   PT SHORT TERM GOAL #2   Title pt will increase Knee AROM by >10 degrees with ext and flex to assist wtih gait efficiency (01/27/2015)   Time 4   Period Weeks   Status Achieved   PT SHORT TERM GOAL #3   Title pt will demonstrate <2/10 during and following squatting activites from a chair height to assist with getting up and down from the chair/floor (01/27/2015)   Time 4   Period Weeks   Status On-going   PT SHORT TERM GOAL #4   Title pt will be able to verbalize and demonstrate tehcniques to  reduce L knee inflammation via RICE, and HEP (01/27/2015)   Time 4   Period Weeks   Status Achieved           PT Long Term Goals - 01/17/15 1421    PT LONG TERM GOAL #1   Title pt will be I with advanced HEP (02/26/2015)   Time 8   Period Weeks   Status On-going   PT LONG TERM GOAL #2   Title pt will increased L knee AROM to WNL compared bil to assist with gait efficency and safety (02/26/2015)   Time 8   Period Weeks   Status On-going   PT LONG TERM GOAL #3   Title pt will increase L knee strength to > 4/5 to assist with prolonged standing/ walking and navigating steps (02/26/2015)   Time 8   Period Weeks   Status On-going   PT LONG TERM GOAL #4   Title pt will be able to verbalize and demonstrate techniques to reduce risk of  L knee reinjury via postural awareness, lifting and carrying mechanics and HEP (02/26/2015)   Time 8   Period Weeks   Status On-going               Plan - 01/24/15 1429    Clinical Impression Statement The pt. had no pain upon arrival.  He continues to demonstrate difficulty with balance in tandem stance and SLS.  He also seems unsteady as he begins to walk.  He was able to progress with LLE strengthening on the leg press and knee flexion/extension on the leg machine.  The pt. was able to perform step downs on a 6" step, although he had some loss of balance.     PT Next Visit Plan recheck strength/AROM L knee, progress with strengthening as tolerated, balance, emphasize control with step downs        Problem List Patient Active Problem List   Diagnosis Date Noted  . Arthritis of left knee 10/27/2014  . 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 CAD (coronary artery disease) 09/12/2013  . Aortic stenosis 09/12/2013  . Dyslipidemia 09/12/2013  . HTN (hypertension) 09/12/2013  . DOE (dyspnea on exertion) 09/12/2013    Kasheena Sambrano,[place], SPTA 01/24/2015, 2:37 PM  [facility] [address] [place], [place], [address] Phone: [phone]   Fax:  [phone]      

## 2015-01-25 ENCOUNTER — Other Ambulatory Visit (HOSPITAL_COMMUNITY): Payer: Self-pay | Admitting: Internal Medicine

## 2015-01-25 DIAGNOSIS — I340 Nonrheumatic mitral (valve) insufficiency: Secondary | ICD-10-CM

## 2015-01-29 ENCOUNTER — Ambulatory Visit: Payer: Medicare Other | Admitting: Physical Therapy

## 2015-01-29 DIAGNOSIS — M25462 Effusion, left knee: Secondary | ICD-10-CM

## 2015-01-29 DIAGNOSIS — M25662 Stiffness of left knee, not elsewhere classified: Secondary | ICD-10-CM

## 2015-01-29 DIAGNOSIS — M25562 Pain in left knee: Secondary | ICD-10-CM

## 2015-01-29 DIAGNOSIS — R269 Unspecified abnormalities of gait and mobility: Secondary | ICD-10-CM

## 2015-01-29 NOTE — Therapy (Signed)
[place], [place], [address] Phone: [phone]   Fax:  [phone]  Physical Therapy Treatment  Patient Details  Name: Jeff Rivera MRN: [id number] Date of Birth: 07/22/1941 Referring Provider:  Thressa Sheller, MD  Encounter Date: 01/29/2015      PT End of Session - 01/29/15 1411    Visit Number 8   Number of Visits 16   Date for PT Re-Evaluation 02/26/15   PT Start Time 1330   PT Stop Time 1425   PT Time Calculation (min) 55 min   Activity Tolerance Patient tolerated treatment well   Behavior During Therapy [facility] for tasks assessed/performed      Past Medical History  Diagnosis Date  . Hypertension   . Dyslipidemia   . CAD (coronary artery disease)   . Aortic stenosis     mild   . ED (erectile dysfunction)   . GERD (gastroesophageal reflux disease)   . Arthritis     OA    Past Surgical History  Procedure Laterality Date  . Cardiac catheterization  01/2010    40% LAD lesion (Dr. Loni Muse. [place])   . Transthoracic echocardiogram  09/2011    EF=>55%, mild conc LVH; normal RV systolic function; LA mildly dilated, IVC borderline dilated with collapse >50%; mild mitral annular calcif & mild MR; trace TR, elevated RVSP; AV mildly sclerotic, mild calcif of AV leaflets, mild valvular AS, mild regurg; trace pulm valve regurg  . Eye surgery Bilateral 2013    LENS REPLACMENT FOR CATRACTS  . 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 Total knee arthroplasty Left 10/27/2014    Procedure: LEFT TOTAL KNEE ARTHROPLASTY;  Surgeon: Mcarthur Rossetti, MD;  Location: [facility];  Service: Orthopedics;  Laterality: Left;    There were no vitals filed for this visit.  Visit Diagnosis:  Left knee pain  Knee stiffness, left  Abnormality of gait  Decreased ROM of left knee  Knee swelling, left      Subjective Assessment - 01/29/15 1334    Subjective " I think I aggitated my knee on Friday, and [place]  He saw the physician this morning 10:30 am he stated that he is doing well with good AROM and to continue with therapy.   Currently in Pain? Yes   Pain Score 0-No pain  0.5/10   Pain Location Knee   Pain Orientation Left   Pain Descriptors / Indicators Aching;Sore   Pain Type Surgical pain   Pain Onset More than a month ago            [facility] PT Assessment - 01/29/15 1342    AROM   Left Knee Extension -3   Left Knee Flexion 118                     [facility] Adult PT Treatment/Exercise - 01/29/15 1338    Knee/Hip Exercises: Aerobic   Stationary Bike L2 x 10 min  lowering seat at 5 min   Knee/Hip Exercises: Machines for Strengthening   Cybex Knee Extension --   Cybex Knee Flexion --   Total Gym Leg Press --   Knee/Hip Exercises: Standing   Lateral Step Up Left;15 reps;Hand Hold: 1;Step Height: 6";Other (comment)   Forward Step Up Left;20 reps;Hand Hold: 1;Step Height: 6"   Step Down Left;15 reps;Hand Hold: 1;Limitations;Step Height: 6"   Other  Standing Knee Exercises sit to stand x 10 with TKE with standing   Knee/Hip Exercises: Seated   Long Arc Quad AROM;Strengthening;Left;15 reps;Weights  with heel slide on floor   Knee/Hip Exercises: Supine   Quad Sets AROM;Strengthening;Left;2 sets;10 reps;Other (comment)  1 set  with manual overpressure   Straight Leg Raises Strengthening;Left;2 sets;10 reps   Cryotherapy   Number Minutes Cryotherapy 15 Minutes   Cryotherapy Location Knee   Type of Cryotherapy Ice pack   Manual Therapy   Manual Therapy Joint mobilization   Joint Mobilization knee A<>P grade 3-4 oscillations, patellar mobilizations grade 3 oscillaitons                  PT Short Term Goals - 01/22/15 1447    PT SHORT TERM GOAL #1   Title pt will be I with basic HEP (01/27/2015)   Time 4   Period Weeks   Status Achieved   PT SHORT TERM GOAL #2   Title pt will increase Knee AROM by >10  degrees with ext and flex to assist wtih gait efficiency (01/27/2015)   Time 4   Period Weeks   Status Achieved   PT SHORT TERM GOAL #3   Title pt will demonstrate <2/10 during and following squatting activites from a chair height to assist with getting up and down from the chair/floor (01/27/2015)   Time 4   Period Weeks   Status On-going   PT SHORT TERM GOAL #4   Title pt will be able to verbalize and demonstrate tehcniques to reduce L knee inflammation via RICE, and HEP (01/27/2015)   Time 4   Period Weeks   Status Achieved           PT Long Term Goals - 01/17/15 1421    PT LONG TERM GOAL #1   Title pt will be I with advanced HEP (02/26/2015)   Time 8   Period Weeks   Status On-going   PT LONG TERM GOAL #2   Title pt will increased L knee AROM to WNL compared bil to assist with gait efficency and safety (02/26/2015)   Time 8   Period Weeks   Status On-going   PT LONG TERM GOAL #3   Title pt will increase L knee strength to > 4/5 to assist with prolonged standing/ walking and navigating steps (02/26/2015)   Time 8   Period Weeks   Status On-going   PT LONG TERM GOAL #4   Title pt will be able to verbalize and demonstrate techniques to reduce risk of  L knee reinjury via postural awareness, lifting and carrying mechanics and HEP (02/26/2015)   Time 8   Period Weeks   Status On-going               Plan - 01/29/15 1642    Clinical Impression Statement Jeff Rivera demonstrated increased stiffness today due to doing more at home over the weekend. He tolerated exercises well with minimal complaint of pain. plan to progress with steps, dynamic balance, and strengthening.    PT Next Visit Plan Progress with strengthening and stretching of LLE, balance, step up/down with control, assess AROM   PT Home Exercise Plan same as previous   Consulted and Agree with Plan of Care Patient        Problem List Patient Active Problem List   Diagnosis Date Noted  . Arthritis of left knee  10/27/2014  . 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 CAD (coronary  artery disease) 09/12/2013  . Aortic stenosis 09/12/2013  . Dyslipidemia 09/12/2013  . HTN (hypertension) 09/12/2013  . DOE (dyspnea on exertion) 09/12/2013   Starr Lake PT, DPT, LAT, ATC  01/29/2015  4:50 PM   [facility] [address] [place], [place], [address] Phone: [phone]   Fax:  [phone]

## 2015-01-31 ENCOUNTER — Ambulatory Visit: Payer: Medicare Other | Admitting: Physical Therapy

## 2015-01-31 DIAGNOSIS — M25462 Effusion, left knee: Secondary | ICD-10-CM

## 2015-01-31 DIAGNOSIS — M25562 Pain in left knee: Secondary | ICD-10-CM

## 2015-01-31 DIAGNOSIS — R269 Unspecified abnormalities of gait and mobility: Secondary | ICD-10-CM

## 2015-01-31 DIAGNOSIS — M25662 Stiffness of left knee, not elsewhere classified: Secondary | ICD-10-CM

## 2015-01-31 NOTE — Therapy (Signed)
[place], [place], [address] Phone: [phone]   Fax:  [phone]  Physical Therapy Treatment  Patient Details  Name: Jeff Rivera MRN: [id number] Date of Birth: Sep 18, 1941 Referring Provider:  Thressa Sheller, MD  Encounter Date: 01/31/2015      PT End of Session - 01/31/15 1327    Visit Number 9   Number of Visits 16   Date for PT Re-Evaluation 02/26/15   PT Start Time 0122   PT Stop Time 0215   PT Time Calculation (min) 53 min      Past Medical History  Diagnosis Date  . Hypertension   . Dyslipidemia   . CAD (coronary artery disease)   . Aortic stenosis     mild   . ED (erectile dysfunction)   . GERD (gastroesophageal reflux disease)   . Arthritis     OA    Past Surgical History  Procedure Laterality Date  . Cardiac catheterization  01/2010    40% LAD lesion (Dr. Loni Muse. [place])   . Transthoracic echocardiogram  09/2011    EF=>55%, mild conc LVH; normal RV systolic function; LA mildly dilated, IVC borderline dilated with collapse >50%; mild mitral annular calcif & mild MR; trace TR, elevated RVSP; AV mildly sclerotic, mild calcif of AV leaflets, mild valvular AS, mild regurg; trace pulm valve regurg  . Eye surgery Bilateral 2013    LENS REPLACMENT FOR CATRACTS  . 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 Total knee arthroplasty Left 10/27/2014    Procedure: LEFT TOTAL KNEE ARTHROPLASTY;  Surgeon: Mcarthur Rossetti, MD;  Location: [facility];  Service: Orthopedics;  Laterality: Left;    There were no vitals filed for this visit.  Visit Diagnosis:  Left knee pain  Knee stiffness, left  Abnormality of gait  Decreased ROM of left knee  Knee swelling, left      Subjective Assessment - 01/31/15 1326    Subjective I was sore/tired/had pain after treatment on Monday.  I woke up with pain that night and took a pain pill.  No pain today, but slight discomfort.     Currently in Pain?  No/denies            [facility] PT Assessment - 01/31/15 0001    AROM   Left Knee Extension -3   Left Knee Flexion 119                     [facility] Adult PT Treatment/Exercise - 01/31/15 1449    Knee/Hip Exercises: Aerobic   Stationary Bike L2 x 10 min  lowering seat at 5 min   Knee/Hip Exercises: Machines for Strengthening   Cybex Knee Extension 1 plate x 15 bilateral   Cybex Knee Flexion 2 plates bil x 20; 2 plates left only x 15; 3 plates bil x 15  cues to control speed   Total Gym Leg Press 1 plate left only x 15; 2 plates bilateral x 20   Knee/Hip Exercises: Standing   Lateral Step Up Left;Hand Hold: 1;Step Height: 6";20 reps   Forward Step Up Left;20 reps;Hand Hold: 1;Step Height: 6"   Step Down Left;15 reps;Hand Hold: 1;Limitations;Step Height: 6"  fatigue at end   Wall Squat 15 reps   SLS LLE x 4  best time was 4 sec with mod-significant postural sway   Rebounder multiple trials red ball 3 toss best SLS left; 2 toss best SLS with foam  pad under R for stability   Other Standing Knee Exercises sit to stand x 10 with TKE with standing, right in front, left tucked under  c/o pain in R knee   Modalities   Modalities Cryotherapy   Cryotherapy   Number Minutes Cryotherapy 15 Minutes   Cryotherapy Location Knee   Type of Cryotherapy Ice pack                  PT Short Term Goals - 01/22/15 1447    PT SHORT TERM GOAL #1   Title pt will be I with basic HEP (01/27/2015)   Time 4   Period Weeks   Status Achieved   PT SHORT TERM GOAL #2   Title pt will increase Knee AROM by >10 degrees with ext and flex to assist wtih gait efficiency (01/27/2015)   Time 4   Period Weeks   Status Achieved   PT SHORT TERM GOAL #3   Title pt will demonstrate <2/10 during and following squatting activites from a chair height to assist with getting up and down from the chair/floor (01/27/2015)   Time 4   Period Weeks   Status On-going   PT SHORT TERM GOAL #4   Title pt will  be able to verbalize and demonstrate tehcniques to reduce L knee inflammation via RICE, and HEP (01/27/2015)   Time 4   Period Weeks   Status Achieved           PT Long Term Goals - 01/17/15 1421    PT LONG TERM GOAL #1   Title pt will be I with advanced HEP (02/26/2015)   Time 8   Period Weeks   Status On-going   PT LONG TERM GOAL #2   Title pt will increased L knee AROM to WNL compared bil to assist with gait efficency and safety (02/26/2015)   Time 8   Period Weeks   Status On-going   PT LONG TERM GOAL #3   Title pt will increase L knee strength to > 4/5 to assist with prolonged standing/ walking and navigating steps (02/26/2015)   Time 8   Period Weeks   Status On-going   PT LONG TERM GOAL #4   Title pt will be able to verbalize and demonstrate techniques to reduce risk of  L knee reinjury via postural awareness, lifting and carrying mechanics and HEP (02/26/2015)   Time 8   Period Weeks   Status On-going               Plan - 01/31/15 1501    Clinical Impression Statement The pt. progressed with step ups and step downs, although he fatigued at the end with step downs.  He is still very limited in balance but was able to progress to the rebounder with slight use of right foot on foam pad for stability.     PT Next Visit Plan Progress with strengthening and stretching of LLE, balance, step up/down with control        Problem List Patient Active Problem List   Diagnosis Date Noted  . Arthritis of left knee 10/27/2014  . 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 CAD (coronary artery disease) 09/12/2013  . Aortic stenosis 09/12/2013  . Dyslipidemia 09/12/2013  . HTN (hypertension) 09/12/2013  . DOE (dyspnea on exertion) 09/12/2013    Thai Burgueno,McKinnley, SPTA 01/31/2015, 3:05 PM  [place], [place], [address] Phone: [phone]   Fax:  [phone]      

## 2015-02-02 ENCOUNTER — Ambulatory Visit (HOSPITAL_COMMUNITY)
Admission: RE | Admit: 2015-02-02 | Discharge: 2015-02-02 | Disposition: A | Discharge status: Home / Self Care | Payer: Medicare Other | Source: Ambulatory Visit | Attending: Cardiology | Admitting: Cardiology

## 2015-02-02 DIAGNOSIS — I10 Essential (primary) hypertension: Secondary | ICD-10-CM | POA: Insufficient documentation

## 2015-02-02 DIAGNOSIS — E785 Hyperlipidemia, unspecified: Secondary | ICD-10-CM | POA: Diagnosis not present

## 2015-02-02 DIAGNOSIS — I350 Nonrheumatic aortic (valve) stenosis: Secondary | ICD-10-CM | POA: Diagnosis not present

## 2015-02-02 DIAGNOSIS — I340 Nonrheumatic mitral (valve) insufficiency: Secondary | ICD-10-CM

## 2015-02-02 NOTE — Progress Notes (Signed)
2D Echocardiogram Complete.  02/02/2015   Veroncia Jezek [place], RDCS

## 2015-02-05 ENCOUNTER — Ambulatory Visit: Payer: Medicare Other | Admitting: Physical Therapy

## 2015-02-05 DIAGNOSIS — M25562 Pain in left knee: Secondary | ICD-10-CM

## 2015-02-05 DIAGNOSIS — M25662 Stiffness of left knee, not elsewhere classified: Secondary | ICD-10-CM

## 2015-02-05 DIAGNOSIS — M25462 Effusion, left knee: Secondary | ICD-10-CM

## 2015-02-05 DIAGNOSIS — R269 Unspecified abnormalities of gait and mobility: Secondary | ICD-10-CM

## 2015-02-05 NOTE — Therapy (Signed)
[place], [place], [address] Phone: [phone]   Fax:  [phone]  Physical Therapy Treatment  Patient Details  Name: Jeff Rivera MRN: [id number] Date of Birth: [birth year] Referring Provider:  Thressa Sheller, MD  Encounter Date: 02/05/2015      PT End of Session - 02/05/15 1339    Visit Number 10   Number of Visits 16   Date for PT Re-Evaluation 02/26/15   PT Start Time 1330   PT Stop Time 1430   PT Time Calculation (min) 60 min   Activity Tolerance Patient tolerated treatment well   Behavior During Therapy Jeff Rivera for tasks assessed/performed      Past Medical History  Diagnosis Date  . Hypertension   . Dyslipidemia   . CAD (coronary artery disease)   . Aortic stenosis     mild   . ED (erectile dysfunction)   . GERD (gastroesophageal reflux disease)   . Arthritis     OA    Past Surgical History  Procedure Laterality Date  . Cardiac catheterization  01/2010    40% LAD lesion (Dr. Loni Rivera. [place])   . Transthoracic echocardiogram  09/2011    EF=>55%, mild conc LVH; normal RV systolic function; LA mildly dilated, IVC borderline dilated with collapse >50%; mild mitral annular calcif & mild MR; trace TR, elevated RVSP; AV mildly sclerotic, mild calcif of AV leaflets, mild valvular AS, mild regurg; trace pulm valve regurg  . Eye surgery Bilateral 2013    Jeff Rivera  . 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 Total knee arthroplasty Left 10/27/2014    Procedure: LEFT TOTAL KNEE ARTHROPLASTY;  Surgeon: Jeff Rossetti, MD;  Location: [facility];  Service: Orthopedics;  Laterality: Left;    There were no vitals filed for this visit.  Visit Diagnosis:  Left knee pain  Knee stiffness, left  Abnormality of gait  Decreased ROM of left knee  Knee swelling, left      Subjective Assessment - 02/05/15 1334    Subjective "I had a rough weekend, I feel like my knee went [place]"  He reports feeling more stiffness in the knee when getting out of bed.    Currently in Pain? Yes   Pain Score 2   took a pain pill at right before todays visit.    Pain Location Knee   Pain Orientation Left   Pain Descriptors / Indicators Aching;Sore   Pain Type Surgical pain   Pain Onset More than a month ago   Pain Frequency Intermittent   Aggravating Factors  climbing steps, and inclines   Pain Relieving Factors keep moving            [facility] PT Assessment - 02/05/15 0001    Observation/Other Assessments   Focus on Therapeutic Outcomes (FOTO)  42% limited   AROM   Left Knee Extension -2   Left Knee Flexion 125   Strength   Left Knee Flexion 4/5   Left Knee Extension 4/5                     [facility] Adult PT Treatment/Exercise - 02/05/15 1338    Knee/Hip Exercises: Aerobic   Stationary Bike L2 x 8 min   Knee/Hip Exercises: Machines for Strengthening   Cybex Knee Extension 2 plate x 15 bilateral   Cybex Knee Flexion --   Total Gym Leg Press 1 plate left only x 15;  2 plates bilateral x 20   Knee/Hip Exercises: Standing   Lateral Step Up Left;Hand Hold: 1;Step Height: 6";20 reps   Forward Step Up Left;20 reps;Hand Hold: 1;Step Height: 6"   Step Down Left;15 reps;Hand Hold: 1;Limitations;Step Height: 6"   Wall Squat 1 set;15 reps   Knee/Hip Exercises: Supine   Straight Leg Raises Strengthening;Left;2 sets;10 reps  with quad set   Cryotherapy   Number Minutes Cryotherapy 15 Minutes   Cryotherapy Location Knee   Type of Cryotherapy --  vasopnuematic compression   Manual Therapy   Manual Therapy Joint mobilization   Joint Mobilization knee A<>P grade 3-4 oscillations, patellar mobilizations grade 3 oscillaitons                PT Education - 02/05/15 1504    Education provided Yes   Education Details functional progress and anatomical education   Person(s) Educated Patient   Methods Explanation   Comprehension Verbalized understanding           PT Short Term Goals - 02/05/15 1515    PT SHORT TERM GOAL #1   Title pt will be I with basic HEP (01/27/2015)   Time 4   Period Weeks   Status Achieved   PT SHORT TERM GOAL #2   Title pt will increase Knee AROM by >10 degrees with ext and flex to assist wtih gait efficiency (01/27/2015)   Time 4   Period Weeks   Status Achieved   PT SHORT TERM GOAL #3   Title pt will demonstrate <2/10 during and following squatting activites from a chair height to assist with getting up and down from the chair/floor (01/27/2015)   Time 4   Period Weeks   Status Achieved   PT SHORT TERM GOAL #4   Title pt will be able to verbalize and demonstrate tehcniques to reduce L knee inflammation via RICE, and HEP (01/27/2015)   Time 4   Period Weeks   Status Achieved           PT Long Term Goals - 02/05/15 1515    PT LONG TERM GOAL #1   Title pt will be I with advanced HEP (02/26/2015)   Time 8   Period Weeks   Status On-going   PT LONG TERM GOAL #2   Title pt will increased L knee AROM to WNL compared bil to assist with gait efficency and safety (02/26/2015)   Time 8   Period Weeks   Status On-going   PT LONG TERM GOAL #3   Title pt will increase L knee strength to > 4/5 to assist with prolonged standing/ walking and navigating steps (02/26/2015)   Time 8   Period Weeks   Status Achieved   PT LONG TERM GOAL #4   Title pt will be able to verbalize and demonstrate techniques to reduce risk of  L knee reinjury via postural awareness, lifting and carrying mechanics and HEP (02/26/2015)   Time 8   Period Weeks   Status On-going               Plan - 02/05/15 1505    Clinical Impression Statement [name] has made progress with AROM of the L kne with -1 to 125 degress with some pain at end range. re-evaluated FOTO and he scored a 48% limitation. He met STG #3, and LTG #3 on todays visit. [name] requires intermittent VC to stay on task throughout exercises. Plan to progress with strengthening and  dynamic exercises and balance  training as tolerated.    PT Next Visit Plan Progress with strengthening and stretching of LLE, balance, step up/down with control, and balance training.    PT Home Exercise Plan same as previous   Consulted and Agree with Plan of Care Patient          G-Codes - 2015/02/14 1516    Functional Assessment Tool Used FOTO 58% limitation   Mobility: Walking and Moving Around Current Status [phone]) At least 40 percent but less than 60 percent impaired, limited or restricted   Mobility: Walking and Moving Around Goal Status [phone]) At least 20 percent but less than 40 percent impaired, limited or restricted      Problem List Patient Active Problem List   Diagnosis Date Noted  . Arthritis of left knee 10/27/2014  . 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 CAD (coronary artery disease) 09/12/2013  . Aortic stenosis 09/12/2013  . Dyslipidemia 09/12/2013  . HTN (hypertension) 09/12/2013  . DOE (dyspnea on exertion) 09/12/2013   Starr Lake PT, DPT, LAT, ATC  Feb 14, 2015  3:22 PM      [facility] [address] [place], [place], [address] Phone: [phone]   Fax:  [phone]

## 2015-02-07 ENCOUNTER — Ambulatory Visit: Payer: Medicare Other | Admitting: Physical Therapy

## 2015-02-07 DIAGNOSIS — M25462 Effusion, left knee: Secondary | ICD-10-CM

## 2015-02-07 DIAGNOSIS — R269 Unspecified abnormalities of gait and mobility: Secondary | ICD-10-CM

## 2015-02-07 DIAGNOSIS — M25662 Stiffness of left knee, not elsewhere classified: Secondary | ICD-10-CM

## 2015-02-07 DIAGNOSIS — M25562 Pain in left knee: Secondary | ICD-10-CM | POA: Diagnosis not present

## 2015-02-07 NOTE — Therapy (Signed)
[place], [place], [address] Phone: [phone]   Fax:  [phone]  Physical Therapy Treatment  Patient Details  Name: Jeff Rivera MRN: [id number] Date of Birth: 1941-06-29 Referring Provider:  Thressa Sheller, MD  Encounter Date: 02/07/2015      PT End of Session - 02/07/15 1513    Visit Number 11   Number of Visits 16   Date for PT Re-Evaluation 02/26/15   PT Start Time 0120   PT Stop Time 0230   PT Time Calculation (min) 70 min      Past Medical History  Diagnosis Date  . Hypertension   . Dyslipidemia   . CAD (coronary artery disease)   . Aortic stenosis     mild   . ED (erectile dysfunction)   . GERD (gastroesophageal reflux disease)   . Arthritis     OA    Past Surgical History  Procedure Laterality Date  . Cardiac catheterization  01/2010    40% LAD lesion (Dr. Loni Muse. [place])   . Transthoracic echocardiogram  09/2011    EF=>55%, mild conc LVH; normal RV systolic function; LA mildly dilated, IVC borderline dilated with collapse >50%; mild mitral annular calcif & mild MR; trace TR, elevated RVSP; AV mildly sclerotic, mild calcif of AV leaflets, mild valvular AS, mild regurg; trace pulm valve regurg  . Eye surgery Bilateral 2013    LENS REPLACMENT FOR CATRACTS  . 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 Total knee arthroplasty Left 10/27/2014    Procedure: LEFT TOTAL KNEE ARTHROPLASTY;  Surgeon: Mcarthur Rossetti, MD;  Location: [facility];  Service: Orthopedics;  Laterality: Left;    There were no vitals filed for this visit.  Visit Diagnosis:  Knee stiffness, left  Left knee pain  Abnormality of gait  Decreased ROM of left knee  Knee swelling, left      Subjective Assessment - 02/07/15 1413    Subjective I feel a "clunk in my knee" when I walk that causes my knee to be sore.                          [facility] Adult PT Treatment/Exercise - 02/07/15 1335    High Level Balance   High Level Balance Activities Tandem walking;Other (comment)   High Level Balance Comments backward tandem walking , tandem static balance with head turns   Knee/Hip Exercises: Aerobic   Stationary Bike L2 x 5 min   Knee/Hip Exercises: Machines for Strengthening   Cybex Knee Extension 1 plates x 15 bilateral, up with both down with left, left single leg 1 plate x 10   Cybex Knee Flexion 3 plates bil x 15   Total Gym Leg Press 2.5 plates bilateralx 20, left only 2 plates with assist on concentric   Knee/Hip Exercises: Standing   Forward Step Up Left;20 reps;Hand Hold: 1;Step Height: 6"   Step Down Left;15 reps;Hand Hold: 1;Limitations;Step Height: 6"   SLS 5 sec best   Rebounder multiple trials red ball 2 toss best SLS left; 2 toss best SLS with foam pad under R for stability   Knee/Hip Exercises: Sidelying   Clams --   Knee/Hip Exercises: Prone   Other Prone Exercises --   Modalities   Modalities Cryotherapy   Cryotherapy   Number Minutes Cryotherapy 15 Minutes   Cryotherapy Location Knee   Type of Cryotherapy Ice pack  PT Short Term Goals - 02/05/15 1515    PT SHORT TERM GOAL #1   Title pt will be I with basic HEP (01/27/2015)   Time 4   Period Weeks   Status Achieved   PT SHORT TERM GOAL #2   Title pt will increase Knee AROM by >10 degrees with ext and flex to assist wtih gait efficiency (01/27/2015)   Time 4   Period Weeks   Status Achieved   PT SHORT TERM GOAL #3   Title pt will demonstrate <2/10 during and following squatting activites from a chair height to assist with getting up and down from the chair/floor (01/27/2015)   Time 4   Period Weeks   Status Achieved   PT SHORT TERM GOAL #4   Title pt will be able to verbalize and demonstrate tehcniques to reduce L knee inflammation via RICE, and HEP (01/27/2015)   Time 4   Period Weeks   Status Achieved           PT Long Term Goals - 02/05/15 1515    PT LONG TERM  GOAL #1   Title pt will be I with advanced HEP (02/26/2015)   Time 8   Period Weeks   Status On-going   PT LONG TERM GOAL #2   Title pt will increased L knee AROM to WNL compared bil to assist with gait efficency and safety (02/26/2015)   Time 8   Period Weeks   Status On-going   PT LONG TERM GOAL #3   Title pt will increase L knee strength to > 4/5 to assist with prolonged standing/ walking and navigating steps (02/26/2015)   Time 8   Period Weeks   Status Achieved   PT LONG TERM GOAL #4   Title pt will be able to verbalize and demonstrate techniques to reduce risk of  L knee reinjury via postural awareness, lifting and carrying mechanics and HEP (02/26/2015)   Time 8   Period Weeks   Status On-going               Plan - 02/07/15 1542    Clinical Impression Statement SLS improved to 5 seconds. Improved stability with simple static balance activities. Able to toss red ball and maintain SLS for 3 tosess.Improved control with knee step downs and retro step ups.    PT Next Visit Plan Progress with strengthening and stretching of LLE, balance, step up/down with control, and balance training.         Problem List Patient Active Problem List   Diagnosis Date Noted  . Arthritis of left knee 10/27/2014  . 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 CAD (coronary artery disease) 09/12/2013  . Aortic stenosis 09/12/2013  . Dyslipidemia 09/12/2013  . HTN (hypertension) 09/12/2013  . DOE (dyspnea on exertion) 09/12/2013    Dorene Ar, PTA 02/07/2015, 3:45 PM  [place], [place], [address] Phone: [phone]   Fax:  [phone]

## 2015-02-23 ENCOUNTER — Ambulatory Visit: Payer: Medicare Other | Admitting: Physical Therapy

## 2015-02-26 ENCOUNTER — Ambulatory Visit: Payer: Medicare Other | Attending: Orthopaedic Surgery | Admitting: Physical Therapy

## 2015-02-26 DIAGNOSIS — R269 Unspecified abnormalities of gait and mobility: Secondary | ICD-10-CM

## 2015-02-26 DIAGNOSIS — M25462 Effusion, left knee: Secondary | ICD-10-CM | POA: Insufficient documentation

## 2015-02-26 DIAGNOSIS — M25662 Stiffness of left knee, not elsewhere classified: Secondary | ICD-10-CM | POA: Diagnosis not present

## 2015-02-26 DIAGNOSIS — R29898 Other symptoms and signs involving the musculoskeletal system: Secondary | ICD-10-CM | POA: Insufficient documentation

## 2015-02-26 DIAGNOSIS — M25562 Pain in left knee: Secondary | ICD-10-CM | POA: Insufficient documentation

## 2015-02-26 NOTE — Therapy (Signed)
[place], [place], [address] Phone: [phone]   Fax:  [phone]  Physical Therapy Treatment  Patient Details  Name: Jeff Rivera MRN: [id number] Date of Birth: Jan 23, 1941 Referring Provider:  Thressa Sheller, MD  Encounter Date: 02/26/2015      PT End of Session - 02/26/15 1009    Visit Number 12   Number of Visits 18   Date for PT Re-Evaluation 04/09/15   PT Start Time 0930   PT Stop Time 1025   PT Time Calculation (min) 55 min   Activity Tolerance Patient tolerated treatment well   Behavior During Therapy [facility] for tasks assessed/performed      Past Medical History  Diagnosis Date  . Hypertension   . Dyslipidemia   . CAD (coronary artery disease)   . Aortic stenosis     mild   . ED (erectile dysfunction)   . GERD (gastroesophageal reflux disease)   . Arthritis     OA    Past Surgical History  Procedure Laterality Date  . Cardiac catheterization  01/2010    40% LAD lesion (Dr. Loni Muse. [place])   . Transthoracic echocardiogram  09/2011    EF=>55%, mild conc LVH; normal RV systolic function; LA mildly dilated, IVC borderline dilated with collapse >50%; mild mitral annular calcif & mild MR; trace TR, elevated RVSP; AV mildly sclerotic, mild calcif of AV leaflets, mild valvular AS, mild regurg; trace pulm valve regurg  . Eye surgery Bilateral 2013    LENS REPLACMENT FOR CATRACTS  . 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 Total knee arthroplasty Left 10/27/2014    Procedure: LEFT TOTAL KNEE ARTHROPLASTY;  Surgeon: Mcarthur Rossetti, MD;  Location: [facility];  Service: Orthopedics;  Laterality: Left;    There were no vitals filed for this visit.  Visit Diagnosis:  Knee stiffness, left  Left knee pain  Abnormality of gait  Decreased ROM of left knee  Knee swelling, left      Subjective Assessment - 02/26/15 0933    Subjective pt reports that he has been feeling more sore and stiff  in the L knee.  Reports no excruciating pain just feeling uncomcomforatable.    Currently in Pain? Yes   Pain Score 0-No pain  took pain medication @ 8 am, only tightness   Pain Location Knee   Pain Orientation Left   Pain Descriptors / Indicators Tightness;Headache   Pain Onset More than a month ago   Pain Frequency Intermittent   Aggravating Factors  going up and down steps, and inclines   Pain Relieving Factors pain medication, bike            [facility] PT Assessment - 02/26/15 0001    Observation/Other Assessments   Focus on Therapeutic Outcomes (FOTO)  32% limited   AROM   Left Knee Extension -7   Left Knee Flexion 120  tightness                      [facility] Adult PT Treatment/Exercise - 02/26/15 0001    Knee/Hip Exercises: Supine   Quad Sets AROM;Strengthening;Left;2 sets;10 reps;Other (comment)   Straight Leg Raises Strengthening;Left;2 sets;10 reps   Cryotherapy   Number Minutes Cryotherapy 15 Minutes   Cryotherapy Location Knee   Type of Cryotherapy Other (comment)  vasopnuematic compression   Manual Therapy   Manual Therapy Joint mobilization   Joint Mobilization knee A<>P grade 3-4 oscillations, patellar  mobilizations grade 3 oscillaitons                PT Education - 02/26/15 1008    Education provided Yes   Education Details updated POC, reviewed HEP. educated on importance of elevation and compression.    Person(s) Educated Patient   Methods Explanation   Comprehension Verbalized understanding          PT Short Term Goals - 02/26/15 1011    PT SHORT TERM GOAL #1   Title pt will be I with basic HEP (01/27/2015)   Time 4   Period Weeks   Status Achieved   PT SHORT TERM GOAL #2   Title pt will increase Knee AROM by >10 degrees with ext and flex to assist wtih gait efficiency (01/27/2015)   Time 4   Period Weeks   Status Achieved   PT SHORT TERM GOAL #3   Title pt will demonstrate <2/10 during and following squatting activites  from a chair height to assist with getting up and down from the chair/floor (01/27/2015)   Time 4   Period Weeks   Status Achieved   PT SHORT TERM GOAL #4   Title pt will be able to verbalize and demonstrate tehcniques to reduce L knee inflammation via RICE, and HEP (01/27/2015)   Time 4   Period Weeks   Status Achieved           PT Long Term Goals - 02/26/15 1012    PT LONG TERM GOAL #1   Title pt will be I with advanced HEP (04/09/2015)   Time 8   Status On-going   PT LONG TERM GOAL #2   Title pt will increased L knee AROM to WNL compared bil to assist with gait efficency and safety (04/09/2015)   Time 8   Period Weeks   Status On-going   PT LONG TERM GOAL #3   Title pt will increase L knee strength to > 4/5 to assist with prolonged standing/ walking and navigating steps (02/26/2015)   Time 8   Period Weeks   PT LONG TERM GOAL #4   Title pt will be able to verbalize and demonstrate techniques to reduce risk of  L knee reinjury via postural awareness, lifting and carrying mechanics and HEP (04/09/2015)   Time 8   Period Weeks   Status On-going               Plan - 02/26/15 1010    Clinical Impression Statement Jeff Rivera continues demonstrates decreaesd AROM of the L LE due to tightness and edema with -7 to 120 degrees. He has made no improvements in strength and has met no goals since the last visit. He continues to report clicking  in the knee that is more annoying than painful with some intermittent pain. Educated the benefts of elevating the knee to reduce swelling to improve AROM and reviewed HEP.   He would benefit from continued physical therapy to continue with his POC.     Pt will benefit from skilled therapeutic intervention in order to improve on the following deficits Abnormal gait;Pain;Impaired flexibility;Decreased balance;Decreased strength;Decreased range of motion;Decreased endurance;Decreased mobility;Increased edema;Difficulty walking   Rehab Potential Good    PT Frequency 2x / week   PT Duration 3 weeks   PT Treatment/Interventions ADLs/Self Care Home Management;Electrical Stimulation;Gait training;Therapeutic exercise;Patient/family education;Balance training;Moist Heat;Neuromuscular re-education;Manual techniques;Therapeutic activities;Cryotherapy;Passive range of motion   PT Next Visit Plan Progress with strengthening and stretching of LLE, balance, step up/down with  control, and balance training.    PT Home Exercise Plan HEP review, importance of RICE for edema   Consulted and Agree with Plan of Care Patient          G-Codes - 14-Mar-2015 1018    Functional Assessment Tool Used FOTO 32% limitation   Functional Limitation Mobility: Walking and moving around   Mobility: Walking and Moving Around Current Status [phone]) At least 20 percent but less than 40 percent impaired, limited or restricted   Mobility: Walking and Moving Around Goal Status [phone]) At least 20 percent but less than 40 percent impaired, limited or restricted      Problem List Patient Active Problem List   Diagnosis Date Noted  . Arthritis of left knee 10/27/2014  . 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 CAD (coronary artery disease) 09/12/2013  . Aortic stenosis 09/12/2013  . Dyslipidemia 09/12/2013  . HTN (hypertension) 09/12/2013  . DOE (dyspnea on exertion) 09/12/2013   Starr Lake PT, DPT, LAT, ATC  03/14/2015  10:21 AM    [facility] [address] [place], [place], [address] Phone: [phone]   Fax:  [phone]

## 2015-02-28 ENCOUNTER — Ambulatory Visit: Payer: Medicare Other | Admitting: Physical Therapy

## 2015-02-28 DIAGNOSIS — M25462 Effusion, left knee: Secondary | ICD-10-CM

## 2015-02-28 DIAGNOSIS — R269 Unspecified abnormalities of gait and mobility: Secondary | ICD-10-CM | POA: Diagnosis not present

## 2015-02-28 DIAGNOSIS — R29898 Other symptoms and signs involving the musculoskeletal system: Secondary | ICD-10-CM | POA: Diagnosis not present

## 2015-02-28 DIAGNOSIS — M25662 Stiffness of left knee, not elsewhere classified: Secondary | ICD-10-CM | POA: Diagnosis not present

## 2015-02-28 DIAGNOSIS — M25562 Pain in left knee: Secondary | ICD-10-CM

## 2015-02-28 NOTE — Therapy (Signed)
[place], [place], [address] Phone: [phone]   Fax:  [phone]  Physical Therapy Treatment  Patient Details  Name: Jeff Rivera MRN: [id number] Date of Birth: 06/24/1941 Referring Provider:  Thressa Sheller, MD  Encounter Date: 02/28/2015      PT End of Session - 02/28/15 1024    Visit Number 13   Number of Visits 18   Date for PT Re-Evaluation 03/19/15   PT Start Time 0932   PT Stop Time 1030   PT Time Calculation (min) 58 min   Activity Tolerance Patient tolerated treatment well   Behavior During Therapy [facility] for tasks assessed/performed      Past Medical History  Diagnosis Date  . Hypertension   . Dyslipidemia   . CAD (coronary artery disease)   . Aortic stenosis     mild   . ED (erectile dysfunction)   . GERD (gastroesophageal reflux disease)   . Arthritis     OA    Past Surgical History  Procedure Laterality Date  . Cardiac catheterization  01/2010    40% LAD lesion (Dr. Loni Muse. [place])   . Transthoracic echocardiogram  09/2011    EF=>55%, mild conc LVH; normal RV systolic function; LA mildly dilated, IVC borderline dilated with collapse >50%; mild mitral annular calcif & mild MR; trace TR, elevated RVSP; AV mildly sclerotic, mild calcif of AV leaflets, mild valvular AS, mild regurg; trace pulm valve regurg  . Eye surgery Bilateral 2013    LENS REPLACMENT FOR CATRACTS  . 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 Total knee arthroplasty Left 10/27/2014    Procedure: LEFT TOTAL KNEE ARTHROPLASTY;  Surgeon: Mcarthur Rossetti, MD;  Location: [facility];  Service: Orthopedics;  Laterality: Left;    There were no vitals filed for this visit.  Visit Diagnosis:  Knee stiffness, left  Left knee pain  Abnormality of gait  Decreased ROM of left knee  Knee swelling, left      Subjective Assessment - 02/28/15 0937    Subjective He reports that he feels like it is getting worse, with  the shifting as he walks. "it feels irritated as I walk, it is tough to explain"   Currently in Pain? Yes   Pain Score 1    Pain Location Knee   Pain Orientation Left   Pain Descriptors / Indicators --  annoying    Pain Type Surgical pain   Pain Onset More than a month ago   Pain Frequency Intermittent            [facility] PT Assessment - 02/28/15 0001    AROM   Left Knee Extension -5   Left Knee Flexion 125  130 after tx                     [facility] Adult PT Treatment/Exercise - 02/28/15 0001    Knee/Hip Exercises: Stretches   Passive Hamstring Stretch 2 reps;30 seconds   Quad Stretch 5 reps;30 seconds  with strap   Hip Flexor Stretch 2 reps;30 seconds   Knee/Hip Exercises: Aerobic   Stationary Bike L2 x 5 min   Knee/Hip Exercises: Standing   Forward Step Up Left;20 reps;Hand Hold: 1;Step Height: 6"   Step Down Left;15 reps;Hand Hold: 1;Limitations;Step Height: 6"   SLS 5 sec best   Rebounder multiple trials red ball 2 toss best SLS left; 2 toss best SLS with foam pad under  R for stability   Knee/Hip Exercises: Supine   Quad Sets AROM;Strengthening;Left;2 sets;10 reps;Other (comment)   Straight Leg Raises Strengthening;Left;2 sets;10 reps   Other Supine Knee Exercises sit to stands 2 x 10   VC for proper form, touching only and back up   Cryotherapy   Number Minutes Cryotherapy 15 Minutes   Cryotherapy Location Knee   Type of Cryotherapy Other (comment)  vasopnuematic compression   Manual Therapy   Manual Therapy Joint mobilization   Joint Mobilization knee A<>P grade 3-4 oscillations, patellar mobilizations grade 3 oscillaitons                PT Education - 02/28/15 1023    Education provided Yes   Education Details reviewed HEP   Person(s) Educated Patient   Methods Explanation   Comprehension Verbalized understanding          PT Short Term Goals - 02/26/15 1011    PT SHORT TERM GOAL #1   Title pt will be I with basic HEP (01/27/2015)    Time 4   Period Weeks   Status Achieved   PT SHORT TERM GOAL #2   Title pt will increase Knee AROM by >10 degrees with ext and flex to assist wtih gait efficiency (01/27/2015)   Time 4   Period Weeks   Status Achieved   PT SHORT TERM GOAL #3   Title pt will demonstrate <2/10 during and following squatting activites from a chair height to assist with getting up and down from the chair/floor (01/27/2015)   Time 4   Period Weeks   Status Achieved   PT SHORT TERM GOAL #4   Title pt will be able to verbalize and demonstrate tehcniques to reduce L knee inflammation via RICE, and HEP (01/27/2015)   Time 4   Period Weeks   Status Achieved           PT Long Term Goals - 02/26/15 1012    PT LONG TERM GOAL #1   Title pt will be I with advanced HEP 03/19/15   Time 8   Status On-going   PT LONG TERM GOAL #2   Title pt will increased L knee AROM to WNL compared bil to assist with gait efficency and safety 03/19/15   Time 8   Period Weeks   Status On-going   PT LONG TERM GOAL #3   Title pt will increase L knee strength to > 4/5 to assist with prolonged standing/ walking and navigating steps (02/26/2015)   Time 8   Period Weeks   PT LONG TERM GOAL #4   Title pt will be able to verbalize and demonstrate techniques to reduce risk of  L knee reinjury via postural awareness, lifting and carrying mechanics and HEP 03/19/15   Time 8   Period Weeks   Status On-going               Plan - 02/28/15 1024    Clinical Impression Statement Criston reported feeling more uncomfortable today due to the popping and clicking of the knee. after tx he was able to get -5 to 130 degrees with pain at endrange. he tolerated strengthening well with most complaint being during sit to stand. educated beneift of  RICE method, and prone quad strengthening/ stretching. plan to progress with strengthening as tolerated.    PT Next Visit Plan Progress with strengthening and stretching of LLE, balance, step up/down with  control, and balance training, stair training.    PT  Home Exercise Plan HEP review, importance of RICE for edema   Consulted and Agree with Plan of Care Patient        Problem List Patient Active Problem List   Diagnosis Date Noted  . Arthritis of left knee 10/27/2014  . 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 CAD (coronary artery disease) 09/12/2013  . Aortic stenosis 09/12/2013  . Dyslipidemia 09/12/2013  . HTN (hypertension) 09/12/2013  . DOE (dyspnea on exertion) 09/12/2013   Starr Lake PT, DPT, LAT, ATC  02/28/2015  10:31 AM    [place] [facility] [address] [place], [place], [address] Phone: [phone]   Fax:  [phone]

## 2015-03-05 ENCOUNTER — Ambulatory Visit: Payer: Medicare Other | Admitting: Physical Therapy

## 2015-03-05 DIAGNOSIS — R29898 Other symptoms and signs involving the musculoskeletal system: Secondary | ICD-10-CM | POA: Diagnosis not present

## 2015-03-05 DIAGNOSIS — M25662 Stiffness of left knee, not elsewhere classified: Secondary | ICD-10-CM | POA: Diagnosis not present

## 2015-03-05 DIAGNOSIS — M25462 Effusion, left knee: Secondary | ICD-10-CM | POA: Diagnosis not present

## 2015-03-05 DIAGNOSIS — M25562 Pain in left knee: Secondary | ICD-10-CM

## 2015-03-05 DIAGNOSIS — R269 Unspecified abnormalities of gait and mobility: Secondary | ICD-10-CM

## 2015-03-05 NOTE — Therapy (Signed)
[place], [place], [address] Phone: [phone]   Fax:  [phone]  Physical Therapy Treatment  Patient Details  Name: Jeff Rivera MRN: [id number] Date of Birth: [birth year] Referring Provider:  Thressa Sheller, MD  Encounter Date: 03/05/2015      PT End of Session - 03/05/15 1016    Visit Number 14   Number of Visits 18   Date for PT Re-Evaluation 03/19/15   PT Start Time 0930   PT Stop Time 1029   PT Time Calculation (min) 59 min   Activity Tolerance Patient tolerated treatment well   Behavior During Therapy [facility] for tasks assessed/performed      Past Medical History  Diagnosis Date  . Hypertension   . Dyslipidemia   . CAD (coronary artery disease)   . Aortic stenosis     mild   . ED (erectile dysfunction)   . GERD (gastroesophageal reflux disease)   . Arthritis     OA    Past Surgical History  Procedure Laterality Date  . Cardiac catheterization  01/2010    40% LAD lesion (Dr. Loni Muse. [place])   . Transthoracic echocardiogram  09/2011    EF=>55%, mild conc LVH; normal RV systolic function; LA mildly dilated, IVC borderline dilated with collapse >50%; mild mitral annular calcif & mild MR; trace TR, elevated RVSP; AV mildly sclerotic, mild calcif of AV leaflets, mild valvular AS, mild regurg; trace pulm valve regurg  . Eye surgery Bilateral 2013    LENS REPLACMENT FOR CATRACTS  . 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 Total knee arthroplasty Left 10/27/2014    Procedure: LEFT TOTAL KNEE ARTHROPLASTY;  Surgeon: Mcarthur Rossetti, MD;  Location: [facility];  Service: Orthopedics;  Laterality: Left;    There were no vitals filed for this visit.  Visit Diagnosis:  Knee stiffness, left  Abnormality of gait  Left knee pain  Decreased ROM of left knee  Knee swelling, left      Subjective Assessment - 03/05/15 0934    Subjective "i've been noticing that I have more movement in the  knee that is starting to cause pain" reported that he may set up an appointmet with Dr Ninfa Linden to review his knee.    Currently in Pain? Yes   Pain Score 5   took pain medication 1 hour prior to therapy   Pain Location Knee   Pain Orientation Left   Pain Descriptors / Indicators Aching   Pain Type Surgical pain   Pain Onset More than a month ago   Pain Frequency Intermittent   Aggravating Factors  walking, going up and down steps/inclines   Pain Relieving Factors pain medication, bike            [facility] PT Assessment - 03/05/15 0001    AROM   Left Knee Extension -5   Left Knee Flexion 126   Strength   Right Knee Flexion 5/5   Right Knee Extension 5/5   Left Knee Flexion 5/5   Left Knee Extension 5/5                     [facility] Adult PT Treatment/Exercise - 03/05/15 0001    Balance   Balance Assessed Yes   Static Standing Balance   Single Leg Stance - Right Leg 7  sec hold   Single Leg Stance - Left Leg 10  x 3    Tandem Stance -  Right Leg 30  x 2   Tandem Stance - Left Leg 30  x 2   Rhomberg - Eyes Opened 30   Rhomberg - Eyes Closed 30   Knee/Hip Exercises: Machines for Strengthening   Cybex Knee Extension 1 plates x 15 bilateral, up with both down with left, left single leg 1 plate x 10   Cybex Knee Flexion 3 plates bil x 15   Total Gym Leg Press 2.5 plates bilateralx 20, left only 2 plates with assist on concentric   Knee/Hip Exercises: Standing   Forward Step Up Left;20 reps;Hand Hold: 1;Step Height: 6"   Step Down Left;15 reps;Hand Hold: 1;Limitations;Step Height: 6"   Knee/Hip Exercises: Supine   Bridges AROM;Strengthening;Both;1 set;10 reps  with 2 x alt kickouts. VC for proper form    Straight Leg Raises Strengthening;Left;2 sets;10 reps  with quad set   Other Supine Knee Exercises sit to stands 2 x 10    Knee/Hip Exercises: Sidelying   Hip ABduction AROM;Strengthening;Left;1 set;15 reps  2#   Hip ABduction Limitations weakness of with  difficulty laying on the R hip   Cryotherapy   Number Minutes Cryotherapy 10 Minutes   Cryotherapy Location Knee   Type of Cryotherapy Other (comment)  vasopnuematic compression   Manual Therapy   Manual Therapy Joint mobilization   Joint Mobilization knee A<>P grade 3-4 oscillations, patellar mobilizations grade 3 oscillaitons                  PT Short Term Goals - 03/05/15 1017    PT SHORT TERM GOAL #1   Title pt will be I with basic HEP (01/27/2015)   Time 4   Period Weeks   Status Achieved   PT SHORT TERM GOAL #2   Title pt will increase Knee AROM by >10 degrees with ext and flex to assist wtih gait efficiency (01/27/2015)   Time 4   Period Weeks   Status Achieved   PT SHORT TERM GOAL #3   Title pt will demonstrate <2/10 during and following squatting activites from a chair height to assist with getting up and down from the chair/floor (01/27/2015)   Time 4   Period Weeks   Status Achieved   PT SHORT TERM GOAL #4   Title pt will be able to verbalize and demonstrate tehcniques to reduce L knee inflammation via RICE, and HEP (01/27/2015)   Time 4   Period Weeks   Status Achieved           PT Long Term Goals - 03/05/15 1017    PT LONG TERM GOAL #1   Title pt will be I with advanced HEP 03/19/15   Time 8   Period Weeks   Status On-going   PT LONG TERM GOAL #2   Title pt will increased L knee AROM to WNL compared bil to assist with gait efficency and safety 03/19/15   Time 8   Period Weeks   Status Partially Met   PT LONG TERM GOAL #3   Title pt will increase L knee strength to > 4/5 to assist with prolonged standing/ walking and navigating steps (02/26/2015)   Time 8   Period Weeks   Status Achieved   PT LONG TERM GOAL #4   Title pt will be able to verbalize and demonstrate techniques to reduce risk of  L knee reinjury via postural awareness, lifting and carrying mechanics and HEP 03/19/15   Time 8   Period Weeks   Status  Achieved                Plan - 03/05/15 1109    Clinical Impression Statement Euan has made progress with incresaed strength of the L knee to 5/5 and increased AROM to -5 to 126 with tightness noted at end range. He met LTG 4, and partially met LTG 2 he report sHe tolerated exercsies well with his only compliant being that it feels like the knee is "loose" and that he may set up an appt with dr. Ninfa Linden following todays visit.    PT Next Visit Plan Progress with strengthening and stretching of LLE, balance, step up/down with control, and balance training, stair training.         Problem List Patient Active Problem List   Diagnosis Date Noted  . Arthritis of left knee 10/27/2014  . 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 CAD (coronary artery disease) 09/12/2013  . Aortic stenosis 09/12/2013  . Dyslipidemia 09/12/2013  . HTN (hypertension) 09/12/2013  . DOE (dyspnea on exertion) 09/12/2013   Starr Lake PT, DPT, LAT, ATC  03/05/2015  1:02 PM    [facility] [address] [place], [place], [address] Phone: [phone]   Fax:  [phone]

## 2015-03-07 ENCOUNTER — Encounter: Payer: Medicare Other | Admitting: Physical Therapy

## 2015-03-14 ENCOUNTER — Ambulatory Visit: Payer: Medicare Other | Attending: Orthopaedic Surgery | Admitting: Physical Therapy

## 2015-03-14 DIAGNOSIS — R269 Unspecified abnormalities of gait and mobility: Secondary | ICD-10-CM | POA: Diagnosis not present

## 2015-03-14 DIAGNOSIS — M25562 Pain in left knee: Secondary | ICD-10-CM | POA: Diagnosis not present

## 2015-03-14 DIAGNOSIS — M25462 Effusion, left knee: Secondary | ICD-10-CM | POA: Diagnosis not present

## 2015-03-14 DIAGNOSIS — R29898 Other symptoms and signs involving the musculoskeletal system: Secondary | ICD-10-CM | POA: Insufficient documentation

## 2015-03-14 DIAGNOSIS — M25662 Stiffness of left knee, not elsewhere classified: Secondary | ICD-10-CM | POA: Diagnosis not present

## 2015-03-14 NOTE — Therapy (Signed)
[place], [place], [address] Phone: [phone]   Fax:  [phone]  Physical Therapy Treatment  Patient Details  Name: Jeff Rivera MRN: [id number] Date of Birth: 1941/01/14 Referring Provider:  Thressa Sheller, MD  Encounter Date: 03/14/2015      PT End of Session - 03/14/15 1025    Visit Number 15   Number of Visits 18   Date for PT Re-Evaluation 03/19/15   PT Start Time 0845   PT Stop Time 0955   PT Time Calculation (min) 70 min   Activity Tolerance Patient tolerated treatment well   Behavior During Therapy [facility] for tasks assessed/performed      Past Medical History  Diagnosis Date  . Hypertension   . Dyslipidemia   . CAD (coronary artery disease)   . Aortic stenosis     mild   . ED (erectile dysfunction)   . GERD (gastroesophageal reflux disease)   . Arthritis     OA    Past Surgical History  Procedure Laterality Date  . Cardiac catheterization  01/2010    40% LAD lesion (Dr. Loni Rivera. [place])   . Transthoracic echocardiogram  09/2011    EF=>55%, mild conc LVH; normal RV systolic function; LA mildly dilated, IVC borderline dilated with collapse >50%; mild mitral annular calcif & mild MR; trace TR, elevated RVSP; AV mildly sclerotic, mild calcif of AV leaflets, mild valvular AS, mild regurg; trace pulm valve regurg  . Eye surgery Bilateral 2013    LENS REPLACMENT FOR CATRACTS  . 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 Total knee arthroplasty Left 10/27/2014    Procedure: LEFT TOTAL KNEE ARTHROPLASTY;  Surgeon: Jeff Rossetti, MD;  Location: [facility];  Service: Orthopedics;  Laterality: Left;    There were no vitals filed for this visit.  Visit Diagnosis:  Knee stiffness, left  Abnormality of gait  Left knee pain  Decreased ROM of left knee  Knee swelling, left      Subjective Assessment - 03/14/15 0852    Subjective " I feel like I am getting gradually worse"   Currently  in Pain? Yes   Pain Score 2    Pain Location Back   Pain Orientation Left   Pain Descriptors / Indicators Aching   Pain Type Surgical pain   Pain Onset More than a month ago   Pain Frequency Constant   Pain Relieving Factors walking, going up and down steps/ inclines   Effect of Pain on Daily Activities pain medication and bike            [facility] PT Assessment - 03/14/15 0856    AROM   Left Knee Extension -4   Left Knee Flexion 126  130 following todays treatment                     [facility] Adult PT Treatment/Exercise - 03/14/15 0855    Knee/Hip Exercises: Stretches   Passive Hamstring Stretch 2 reps;30 seconds   Quad Stretch 5 reps;30 seconds  in prone with strap   Hip Flexor Stretch 2 reps;30 seconds   Knee/Hip Exercises: Aerobic   Stationary Bike L2 x 8 min   Knee/Hip Exercises: Machines for Strengthening   Cybex Knee Extension 1 plates x 15 bilateral, up with both down with left, left single leg 1 plate x 10   Cybex Knee Flexion 3 plates bil x 15   Total Gym Leg Press  2.5 plates bilateralx 20, left only 2 plates with assist on concentric   Knee/Hip Exercises: Supine   Straight Leg Raises Strengthening;Left;2 sets;10 reps  3 sec hold 2 x 10   Cryotherapy   Number Minutes Cryotherapy 15 Minutes   Cryotherapy Location Knee   Type of Cryotherapy Other (comment)  vasopnuematic compression   Manual Therapy   Manual Therapy Joint mobilization;Taping;Soft tissue mobilization   Joint Mobilization knee A<>P grade 3-4 oscillations, patellar mobilizations grade 3 oscillaitons   Soft tissue mobilization STM/ DTM with trigger point over lateral vastus  retro/antero grade massage for edema reduction.    Kinesiotex Edema   Kinesiotix   Edema Left anterior quadriceps and knee                PT Education - 03/14/15 0936    Education provided Yes   Education Details kinesio tape education, performing self massage to decrease edema          PT Short Term  Goals - 03/05/15 1017    PT SHORT TERM GOAL #1   Title pt will be I with basic HEP (01/27/2015)   Time 4   Period Weeks   Status Achieved   PT SHORT TERM GOAL #2   Title pt will increase Knee AROM by >10 degrees with ext and flex to assist wtih gait efficiency (01/27/2015)   Time 4   Period Weeks   Status Achieved   PT SHORT TERM GOAL #3   Title pt will demonstrate <2/10 during and following squatting activites from a chair height to assist with getting up and down from the chair/floor (01/27/2015)   Time 4   Period Weeks   Status Achieved   PT SHORT TERM GOAL #4   Title pt will be able to verbalize and demonstrate tehcniques to reduce L knee inflammation via RICE, and HEP (01/27/2015)   Time 4   Period Weeks   Status Achieved           PT Long Term Goals - 03/05/15 1017    PT LONG TERM GOAL #1   Title pt will be I with advanced HEP 03/19/15   Time 8   Period Weeks   Status On-going   PT LONG TERM GOAL #2   Title pt will increased L knee AROM to WNL compared bil to assist with gait efficency and safety 03/19/15   Time 8   Period Weeks   Status Partially Met   PT LONG TERM GOAL #3   Title pt will increase L knee strength to > 4/5 to assist with prolonged standing/ walking and navigating steps (02/26/2015)   Time 8   Period Weeks   Status Achieved   PT LONG TERM GOAL #4   Title pt will be able to verbalize and demonstrate techniques to reduce risk of  L knee reinjury via postural awareness, lifting and carrying mechanics and HEP 03/19/15   Time 8   Period Weeks   Status Achieved               Plan - 03/14/15 1026    Clinical Impression Statement Jeff Rivera continues to present to therpay with report of tightness and clicking in the knee. He continues to demonstrate increased swelling. Focused todays treatment on reducing swelling and increased his AROM. performed antegrade/retrograde massage in combination with kinesio taping for swelling. after tx he was able to get 130  dedgrees of flexion which he initally got 126. Plan to progress with strengthening and  balance as tolerated.    PT Next Visit Plan Progress with strengthening and stretching of LLE, balance, step up/down with control, and balance training, stair training.    Consulted and Agree with Plan of Care Patient        Problem List Patient Active Problem List   Diagnosis Date Noted  . Arthritis of left knee 10/27/2014  . 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 CAD (coronary artery disease) 09/12/2013  . Aortic stenosis 09/12/2013  . Dyslipidemia 09/12/2013  . HTN (hypertension) 09/12/2013  . DOE (dyspnea on exertion) 09/12/2013   Starr Lake PT, DPT, LAT, ATC  03/14/2015  11:05 AM   [facility] [address] [place], [place], [address] Phone: [phone]   Fax:  [phone]

## 2015-03-16 ENCOUNTER — Ambulatory Visit: Payer: Medicare Other | Admitting: Physical Therapy

## 2015-03-16 DIAGNOSIS — M25562 Pain in left knee: Secondary | ICD-10-CM

## 2015-03-16 DIAGNOSIS — M25462 Effusion, left knee: Secondary | ICD-10-CM

## 2015-03-16 DIAGNOSIS — R269 Unspecified abnormalities of gait and mobility: Secondary | ICD-10-CM

## 2015-03-16 DIAGNOSIS — M25662 Stiffness of left knee, not elsewhere classified: Secondary | ICD-10-CM

## 2015-03-16 NOTE — Therapy (Signed)
[place], [place], [address] Phone: [phone]   Fax:  [phone]  Physical Therapy Treatment  Patient Details  Name: Jeff Rivera MRN: [id number] Date of Birth: Jan 21, 1941 Referring Provider:  Thressa Sheller, MD  Encounter Date: 03/16/2015      PT End of Session - 03/16/15 1104    Visit Number 16   Number of Visits 18   Date for PT Re-Evaluation 03/19/15   PT Start Time 5916   PT Stop Time 1100   PT Time Calculation (min) 45 min   Activity Tolerance Patient tolerated treatment well   Behavior During Therapy [facility] for tasks assessed/performed      Past Medical History  Diagnosis Date  . Hypertension   . Dyslipidemia   . CAD (coronary artery disease)   . Aortic stenosis     mild   . ED (erectile dysfunction)   . GERD (gastroesophageal reflux disease)   . Arthritis     OA    Past Surgical History  Procedure Laterality Date  . Cardiac catheterization  01/2010    40% LAD lesion (Dr. Loni Muse. [facility])   . Transthoracic echocardiogram  09/2011    EF=>55%, mild conc LVH; normal RV systolic function; LA mildly dilated, IVC borderline dilated with collapse >50%; mild mitral annular calcif & mild MR; trace TR, elevated RVSP; AV mildly sclerotic, mild calcif of AV leaflets, mild valvular AS, mild regurg; trace pulm valve regurg  . Eye surgery Bilateral 2013    LENS REPLACMENT FOR CATRACTS  . 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 Total knee arthroplasty Left 10/27/2014    Procedure: LEFT TOTAL KNEE ARTHROPLASTY;  Surgeon: Mcarthur Rossetti, MD;  Location: [facility];  Service: Orthopedics;  Laterality: Left;    There were no vitals filed for this visit.  Visit Diagnosis:  Knee stiffness, left  Abnormality of gait  Left knee pain  Decreased ROM of left knee  Knee swelling, left      Subjective Assessment - 03/16/15 1021    Subjective "I don't know if it is my imagination but the tape is  making my knee feel better"   Currently in Pain? Yes   Pain Score 0-No pain            [facility] PT Assessment - 03/16/15 0001    AROM   Left Knee Extension -3   Left Knee Flexion 125                     [facility] Adult PT Treatment/Exercise - 03/16/15 1023    Static Standing Balance   Single Leg Stance - Left Leg 10  2 sets   Tandem Stance - Right Leg 30  2 sets   Tandem Stance - Left Leg 30  2 sets   Knee/Hip Exercises: Aerobic   Stationary Bike L3 x 10 min   Knee/Hip Exercises: Machines for Strengthening   Cybex Knee Extension 1 plates x 15 bilateral, up with both down with left, left single leg 1 plate x 10   Cybex Knee Flexion 3 plates bil x 15   Total Gym Leg Press 2.5 plates bilateralx 20, left only 2 plates with assist on concentric   Knee/Hip Exercises: Supine   Bridges AROM;Strengthening;Both;1 set;15 reps   Straight Leg Raises Strengthening;Left;2 sets;15 reps   Other Supine Knee Exercises sit to stands 2 x 10    Knee/Hip Exercises: Sidelying   Hip  ABduction AROM;Strengthening;Left;1 set;15 reps   Moist Heat Therapy   Number Minutes Moist Heat 10 Minutes   Moist Heat Location Knee   Manual Therapy   Manual Therapy Joint mobilization;Taping;Soft tissue mobilization   Joint Mobilization knee A<>P grade 3-4 oscillations, patellar mobilizations grade 3 oscillaitons   Soft tissue mobilization STM/ DTM with trigger point over lateral vastus   Kinesiotex Edema   Kinesiotix   Edema Left anterior quadriceps and knee                PT Education - 03/16/15 1104    Education provided Yes   Education Details kinesio taping education   Person(s) Educated Patient   Methods Explanation   Comprehension Verbalized understanding          PT Short Term Goals - 03/16/15 1112    PT SHORT TERM GOAL #1   Title pt will be I with basic HEP (01/27/2015)   Time 4   Period Weeks   Status Achieved   PT SHORT TERM GOAL #2   Title pt will increase Knee AROM by  >10 degrees with ext and flex to assist wtih gait efficiency (01/27/2015)   Time 4   Period Weeks   Status Achieved   PT SHORT TERM GOAL #3   Title pt will demonstrate <2/10 during and following squatting activites from a chair height to assist with getting up and down from the chair/floor (01/27/2015)   Time 4   Period Weeks   Status Achieved   PT SHORT TERM GOAL #4   Title pt will be able to verbalize and demonstrate tehcniques to reduce L knee inflammation via RICE, and HEP (01/27/2015)   Time 4   Period Weeks   Status Achieved           PT Long Term Goals - 03/16/15 1113    PT LONG TERM GOAL #1   Title pt will be I with advanced HEP 03/19/15   Time 8   Period Weeks   Status On-going   PT LONG TERM GOAL #2   Title pt will increased L knee AROM to WNL compared bil to assist with gait efficency and safety 03/19/15   Time 8   Period Weeks   Status Partially Met   PT LONG TERM GOAL #3   Title pt will increase L knee strength to > 4/5 to assist with prolonged standing/ walking and navigating steps (02/26/2015)   Time 8   Period Weeks   Status Achieved   PT LONG TERM GOAL #4   Title pt will be able to verbalize and demonstrate techniques to reduce risk of  L knee reinjury via postural awareness, lifting and carrying mechanics and HEP 03/19/15   Time 8   Status Achieved               Plan - 03/16/15 1105    Clinical Impression Statement [name] reports to therapy reporting that he was feeling better since the last visit. He tolerted all exercises well today with no report of increased symptoms. He states he still clicks in the knee, but that it wasn't as bad since the last visit. Plan to progress with dynamic strengthening and balance as tolerated.    PT Next Visit Plan Progress with strengthening and stretching of LLE, balance, step up/down with control, and balance training, stair training. manual for quadricep relief        Problem List Patient Active Problem List    Diagnosis Date Noted  .  Arthritis of left knee 10/27/2014  . 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 CAD (coronary artery disease) 09/12/2013  . Aortic stenosis 09/12/2013  . Dyslipidemia 09/12/2013  . HTN (hypertension) 09/12/2013  . DOE (dyspnea on exertion) 09/12/2013   Starr Lake PT, DPT, LAT, ATC  03/16/2015  11:21 AM    [facility] [address] [place], [place], [address] Phone: [phone]   Fax:  [phone]

## 2015-03-20 ENCOUNTER — Ambulatory Visit: Payer: Medicare Other | Admitting: Physical Therapy

## 2015-03-20 DIAGNOSIS — R269 Unspecified abnormalities of gait and mobility: Secondary | ICD-10-CM

## 2015-03-20 DIAGNOSIS — M25562 Pain in left knee: Secondary | ICD-10-CM

## 2015-03-20 DIAGNOSIS — M25662 Stiffness of left knee, not elsewhere classified: Secondary | ICD-10-CM | POA: Diagnosis not present

## 2015-03-20 DIAGNOSIS — M25462 Effusion, left knee: Secondary | ICD-10-CM

## 2015-03-20 NOTE — Therapy (Signed)
[place], [place], [address] Phone: [phone]   Fax:  [phone]  Physical Therapy Treatment  Patient Details  Name: Jeff Rivera MRN: [id number] Date of Birth: [birth year] Referring Provider:  Thressa Sheller, MD  Encounter Date: 03/20/2015      PT End of Session - 03/20/15 0935    Visit Number 17   Number of Visits 18   Date for PT Re-Evaluation 03/19/15   PT Start Time 0930   PT Stop Time 3536   PT Time Calculation (min) 65 min      Past Medical History  Diagnosis Date  . Hypertension   . Dyslipidemia   . CAD (coronary artery disease)   . Aortic stenosis     mild   . ED (erectile dysfunction)   . GERD (gastroesophageal reflux disease)   . Arthritis     OA    Past Surgical History  Procedure Laterality Date  . Cardiac catheterization  01/2010    40% LAD lesion (Dr. Loni Muse. [facility])   . Transthoracic echocardiogram  09/2011    EF=>55%, mild conc LVH; normal RV systolic function; LA mildly dilated, IVC borderline dilated with collapse >50%; mild mitral annular calcif & mild MR; trace TR, elevated RVSP; AV mildly sclerotic, mild calcif of AV leaflets, mild valvular AS, mild regurg; trace pulm valve regurg  . Eye surgery Bilateral 2013    LENS REPLACMENT FOR CATRACTS  . 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 Total knee arthroplasty Left 10/27/2014    Procedure: LEFT TOTAL KNEE ARTHROPLASTY;  Surgeon: Mcarthur Rossetti, MD;  Location: [facility];  Service: Orthopedics;  Laterality: Left;    There were no vitals filed for this visit.  Visit Diagnosis:  Knee stiffness, left  Abnormality of gait  Left knee pain  Decreased ROM of left knee  Knee swelling, left      Subjective Assessment - 03/20/15 0940    Subjective I think the tape helps, not sure. It still clicks. I am not sure I will do the other knee.    Currently in Pain? Yes   Pain Score 2    Pain Location Knee   Pain  Orientation Left   Pain Descriptors / Indicators Discomfort   Pain Type Surgical pain   Aggravating Factors  down inclines   Pain Relieving Factors tape, massage                         [facility] Adult PT Treatment/Exercise - 03/20/15 0937    Knee/Hip Exercises: Aerobic   Stationary Bike L3 x 10 min   Knee/Hip Exercises: Machines for Strengthening   Cybex Knee Extension 1 plates x 15 bilateral, up with both down with left, left single leg 1 plate x 10   Cybex Knee Flexion 3 plates bil x 15   Total Gym Leg Press 2.5 plates bilateralx 20, left only 1.5 plates with no  assist on concentric   Knee/Hip Exercises: Standing   Rebounder multiple trials, needs toe touch down on right    Other Standing Knee Exercises resisted gait 2-3 plates forward and backwards cues for larger steps   Moist Heat Therapy   Number Minutes Moist Heat 15 Minutes   Moist Heat Location Knee   Manual Therapy   Manual Therapy Soft tissue mobilization;Taping   Soft tissue mobilization STM/ DTM with trigger point over lateral vastus   Kinesiotex Edema  Kinesiotix   Edema Left anterior quadriceps and knee                  PT Short Term Goals - 03/16/15 1112    PT SHORT TERM GOAL #1   Title pt will be I with basic HEP (01/27/2015)   Time 4   Period Weeks   Status Achieved   PT SHORT TERM GOAL #2   Title pt will increase Knee AROM by >10 degrees with ext and flex to assist wtih gait efficiency (01/27/2015)   Time 4   Period Weeks   Status Achieved   PT SHORT TERM GOAL #3   Title pt will demonstrate <2/10 during and following squatting activites from a chair height to assist with getting up and down from the chair/floor (01/27/2015)   Time 4   Period Weeks   Status Achieved   PT SHORT TERM GOAL #4   Title pt will be able to verbalize and demonstrate tehcniques to reduce L knee inflammation via RICE, and HEP (01/27/2015)   Time 4   Period Weeks   Status Achieved           PT Long  Term Goals - 03/16/15 1113    PT LONG TERM GOAL #1   Title pt will be I with advanced HEP 03/19/15   Time 8   Period Weeks   Status On-going   PT LONG TERM GOAL #2   Title pt will increased L knee AROM to WNL compared bil to assist with gait efficency and safety 03/19/15   Time 8   Period Weeks   Status Partially Met   PT LONG TERM GOAL #3   Title pt will increase L knee strength to > 4/5 to assist with prolonged standing/ walking and navigating steps (02/26/2015)   Time 8   Period Weeks   Status Achieved   PT LONG TERM GOAL #4   Title pt will be able to verbalize and demonstrate techniques to reduce risk of  L knee reinjury via postural awareness, lifting and carrying mechanics and HEP 03/19/15   Time 8   Status Achieved               Plan - 03/20/15 1022    Clinical Impression Statement Pt reports less pain with inclines and declines with most difficulty noted on the declines. He continues to have difficulty with SLS activities and dynamic balance activities. He reports KT tape may have helped his right knee swelling and the sodt tissue work makes hi sknee feel better for an uncertain amount of time. Pt instructed in LE strenthening and dynamic balance activities without c/o increased pain.    PT Next Visit Plan Progress with strengthening and stretching of LLE, balance, step up/down with control, and balance training, stair training. manual for quadricep relief        Problem List Patient Active Problem List   Diagnosis Date Noted  . Arthritis of left knee 10/27/2014  . 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 CAD (coronary artery disease) 09/12/2013  . Aortic stenosis 09/12/2013  . Dyslipidemia 09/12/2013  . HTN (hypertension) 09/12/2013  . DOE (dyspnea on exertion) 09/12/2013    Dorene Ar, PTA 03/20/2015, 10:26 AM   Starr Lake PT, DPT, LAT, ATC  03/20/2015  2:00 PM    [facility] [address] [place], [place], [address] Phone: [phone]   Fax:  [phone]

## 2015-03-22 ENCOUNTER — Ambulatory Visit: Payer: Medicare Other | Admitting: Physical Therapy

## 2015-03-22 ENCOUNTER — Encounter: Payer: Medicare Other | Admitting: Physical Therapy

## 2015-03-22 DIAGNOSIS — M25562 Pain in left knee: Secondary | ICD-10-CM

## 2015-03-22 DIAGNOSIS — M25662 Stiffness of left knee, not elsewhere classified: Secondary | ICD-10-CM | POA: Diagnosis not present

## 2015-03-22 DIAGNOSIS — M25462 Effusion, left knee: Secondary | ICD-10-CM

## 2015-03-22 DIAGNOSIS — R269 Unspecified abnormalities of gait and mobility: Secondary | ICD-10-CM

## 2015-03-22 NOTE — Therapy (Signed)
[place], [place], [address] Phone: [phone]   Fax:  [phone]  Physical Therapy Treatment  Patient Details  Name: Jeff Rivera MRN: [id number] Date of Birth: October 14, 1940 Referring Provider:  Thressa Sheller, MD  Encounter Date: 03/22/2015    Past Medical History  Diagnosis Date  . Hypertension   . Dyslipidemia   . CAD (coronary artery disease)   . Aortic stenosis     mild   . ED (erectile dysfunction)   . GERD (gastroesophageal reflux disease)   . Arthritis     OA    Past Surgical History  Procedure Laterality Date  . Cardiac catheterization  01/2010    40% LAD lesion (Dr. Loni Muse. [place])   . Transthoracic echocardiogram  09/2011    EF=>55%, mild conc LVH; normal RV systolic function; LA mildly dilated, IVC borderline dilated with collapse >50%; mild mitral annular calcif & mild MR; trace TR, elevated RVSP; AV mildly sclerotic, mild calcif of AV leaflets, mild valvular AS, mild regurg; trace pulm valve regurg  . Eye surgery Bilateral 2013    LENS REPLACMENT FOR CATRACTS  . 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 Total knee arthroplasty Left 10/27/2014    Procedure: LEFT TOTAL KNEE ARTHROPLASTY;  Surgeon: Mcarthur Rossetti, MD;  Location: [facility];  Service: Orthopedics;  Laterality: Left;    There were no vitals filed for this visit.  Visit Diagnosis:  Knee stiffness, left  Abnormality of gait  Left knee pain  Decreased ROM of left knee  Knee swelling, left      Subjective Assessment - 03/22/15 1415    Subjective "I think I am getting worse, and the knee is getting more stiff with a painfull feeling of 3/10, it feels like its rubbing"   Currently in Pain? Yes   Pain Score 3    Pain Location Knee   Pain Orientation Left   Pain Descriptors / Indicators --  rubbing   Pain Onset More than a month ago   Pain Frequency Constant            [facility] PT Assessment - 03/22/15 0001     Observation/Other Assessments   Focus on Therapeutic Outcomes (FOTO)  40% limited   AROM   Left Knee Extension -5   Left Knee Flexion 125   Strength   Right Knee Flexion 5/5   Right Knee Extension 5/5   Left Knee Flexion 5/5   Left Knee Extension 5/5                     [facility] Adult PT Treatment/Exercise - 03/22/15 0001    Knee/Hip Exercises: Aerobic   Stationary Bike L3 x 6 min                  PT Short Term Goals - 03/16/15 1112    PT SHORT TERM GOAL #1   Title pt will be I with basic HEP (01/27/2015)   Time 4   Period Weeks   Status Achieved   PT SHORT TERM GOAL #2   Title pt will increase Knee AROM by >10 degrees with ext and flex to assist wtih gait efficiency (01/27/2015)   Time 4   Period Weeks   Status Achieved   PT SHORT TERM GOAL #3   Title pt will demonstrate <2/10 during and following squatting activites from a chair height to assist with getting up and down from  the chair/floor (01/27/2015)   Time 4   Period Weeks   Status Achieved   PT SHORT TERM GOAL #4   Title pt will be able to verbalize and demonstrate tehcniques to reduce L knee inflammation via RICE, and HEP (01/27/2015)   Time 4   Period Weeks   Status Achieved           PT Long Term Goals - 03-25-2015 1431    PT LONG TERM GOAL #1   Title pt will be I with advanced HEP 03/19/15   Time 8   Period Weeks   Status Achieved   PT LONG TERM GOAL #2   Title pt will increased L knee AROM to WNL compared bil to assist with gait efficency and safety 03/19/15   Time 8   Period Weeks   Status Achieved   PT LONG TERM GOAL #3   Title pt will increase L knee strength to > 4/5 to assist with prolonged standing/ walking and navigating steps (02/26/2015)   Time 8   Period Weeks   Status Achieved   PT LONG TERM GOAL #4   Title pt will be able to verbalize and demonstrate techniques to reduce risk of  L knee reinjury via postural awareness, lifting and carrying mechanics and HEP 03/19/15    Time 8   Period Weeks   Status Achieved               Plan - 03-25-15 1442    Clinical Impression Statement Jeff Rivera has made great progress with increased left knee AROM to -5 to 125, increased strength to 5/5 with no pain during testing. His biggest complaint is intermittent swelling and clicking in the knee. He reports tha the plans to see Dr. Ninfa Linden on the 04/02/2015 and will discuss his knee a that time. He has met all STG/.LTG and is able to maintain current level of function independently without skilled physical therapy.  Plan to discharge today.    PT Next Visit Plan discharged from physical therapy today   PT Home Exercise Plan HEP review   Consulted and Agree with Plan of Care Patient          G-Codes - 03/25/2015 1441    Functional Assessment Tool Used FOTO 40%% limitation   Functional Limitation Mobility: Walking and moving around   Mobility: Walking and Moving Around Goal Status [phone]) At least 20 percent but less than 40 percent impaired, limited or restricted   Mobility: Walking and Moving Around Discharge Status [phone]) At least 20 percent but less than 40 percent impaired, limited or restricted      Problem List Patient Active Problem List   Diagnosis Date Noted  . Arthritis of left knee 10/27/2014  . 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 CAD (coronary artery disease) 09/12/2013  . Aortic stenosis 09/12/2013  . Dyslipidemia 09/12/2013  . HTN (hypertension) 09/12/2013  . DOE (dyspnea on exertion) 09/12/2013                         PHYSICAL THERAPY DISCHARGE SUMMARY  Visits from Start of Care: 18  Current functional level related to goals / functional outcomes: FOTO 40% limited   Remaining deficits: Clicking in the knee that's more irritating with intermittent pain   Education / Equipment: HEP handout, and RICE to control inflammation PRN  Plan: Patient agrees to discharge.  Patient goals were met. Patient is  being discharged due to meeting the  stated rehab goals.  ?????         PT, DPT, LAT, ATC  03/22/2015  2:48 PM    [facility] [address] [place], [place], [address] Phone: [phone]   Fax:  [phone]      

## 2015-03-27 ENCOUNTER — Ambulatory Visit: Payer: Medicare Other | Admitting: Physical Therapy

## 2015-03-29 ENCOUNTER — Encounter: Payer: Medicare Other | Admitting: Physical Therapy

## 2015-04-02 DIAGNOSIS — M1712 Unilateral primary osteoarthritis, left knee: Secondary | ICD-10-CM | POA: Diagnosis not present

## 2015-04-02 DIAGNOSIS — Z96652 Presence of left artificial knee joint: Secondary | ICD-10-CM | POA: Diagnosis not present

## 2015-04-02 DIAGNOSIS — M1711 Unilateral primary osteoarthritis, right knee: Secondary | ICD-10-CM | POA: Diagnosis not present

## 2015-04-12 DIAGNOSIS — H35371 Puckering of macula, right eye: Secondary | ICD-10-CM | POA: Diagnosis not present

## 2015-04-12 DIAGNOSIS — Z961 Presence of intraocular lens: Secondary | ICD-10-CM | POA: Diagnosis not present

## 2015-04-12 DIAGNOSIS — H43813 Vitreous degeneration, bilateral: Secondary | ICD-10-CM | POA: Diagnosis not present

## 2015-04-12 DIAGNOSIS — H5213 Myopia, bilateral: Secondary | ICD-10-CM | POA: Diagnosis not present

## 2015-06-19 DIAGNOSIS — M1711 Unilateral primary osteoarthritis, right knee: Secondary | ICD-10-CM | POA: Diagnosis not present

## 2015-06-20 IMAGING — CT CT CHEST W/ CM
3 series · 17 of 29 positions shown, 19 images · IV contrast (75CC OMNI 300)
Comparison: 01/22/2010

CLINICAL DATA: Shortness of breath

CT CHEST WITH CONTRAST
TECHNIQUE: Multidetector CT imaging of the chest was performed
following the standard protocol during bolus administration of
intravenous contrast.
Contrast: 75mL OMNIPAQUE IOHEXOL 300 MG/ML  SOLN
BUN and creatinine were obtained on site at [HOSPITAL] at
[HOSPITAL]..
Results:  BUN 20 mg/dL,  Creatinine 1.3  mg/dL.

[Series 2: chest with · axial · 0.70mm/px · z∈[-232,-37]mm · 5 of 66 slices shown, 7 images]
[im 14/66  mediastinal]
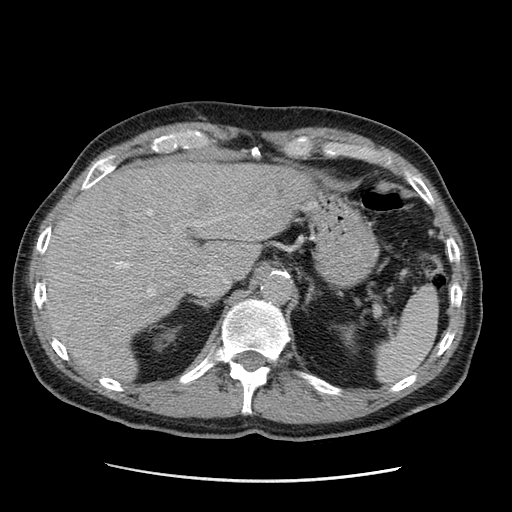
[im 14/66  lung]
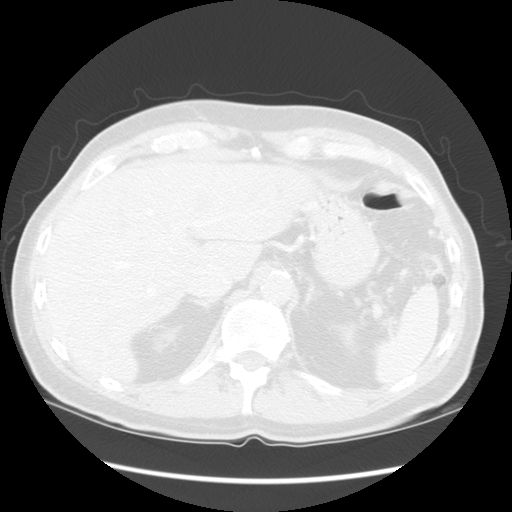
[im 27/66  lung]
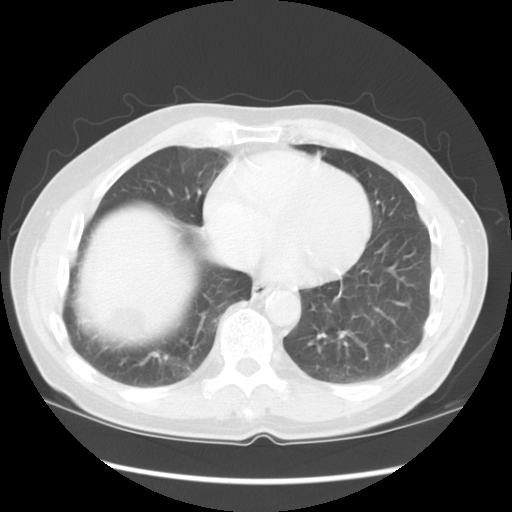
[im 34/66  lung]
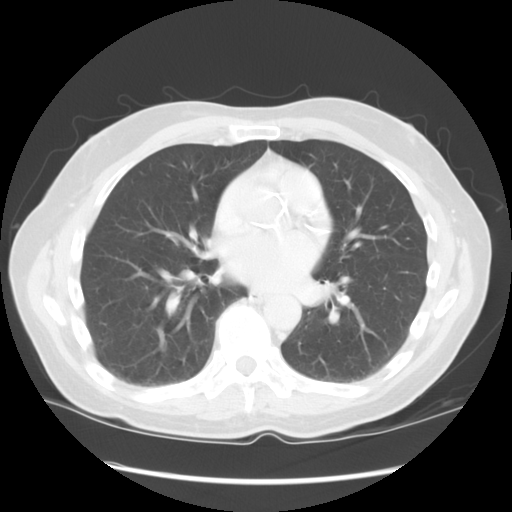
[im 40/66  lung]
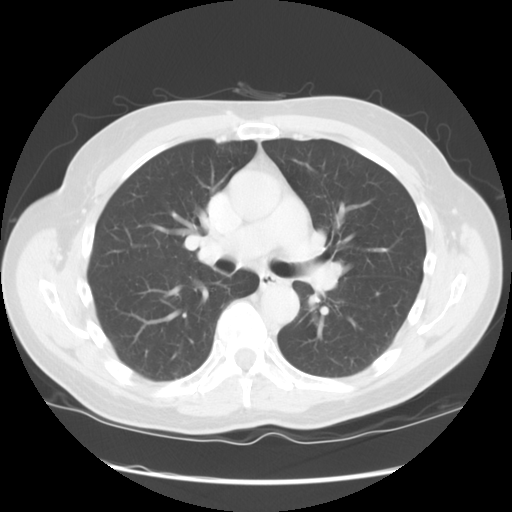
[im 53/66  mediastinal]
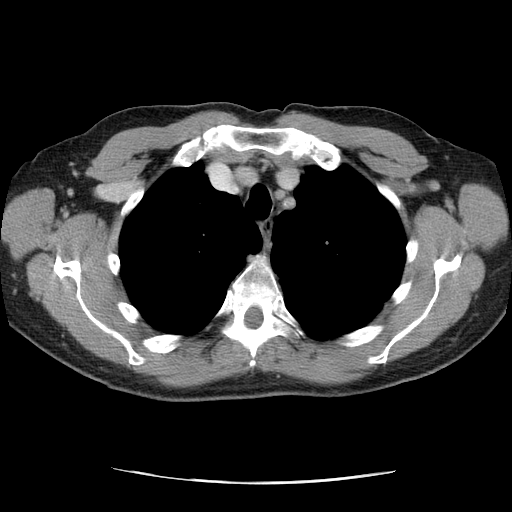
[im 53/66  lung]
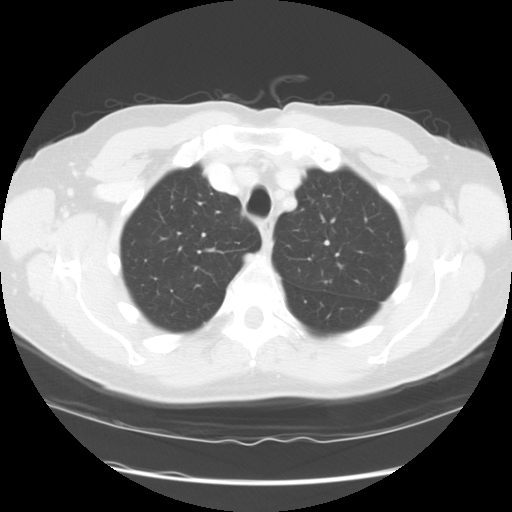

[Series 400: sagittal · sagittal · 0.70mm/px · 8 of 134 slices shown]
[im 12/134  lung]
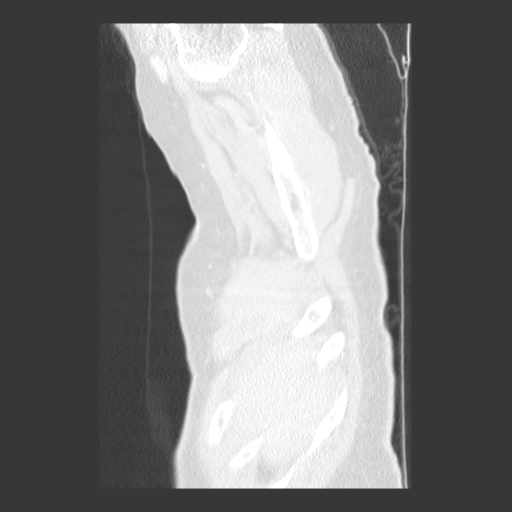
[im 34/134  lung]
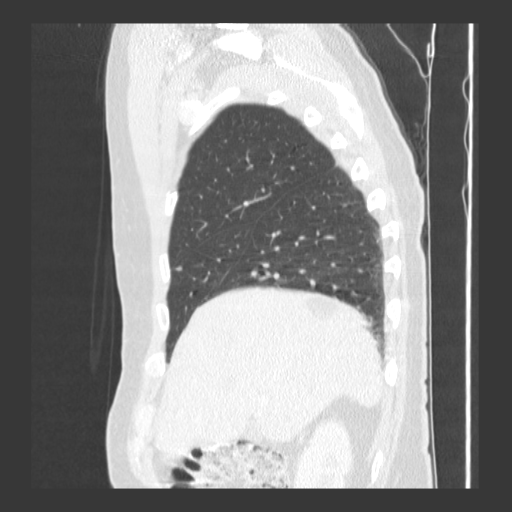
[im 45/134  lung]
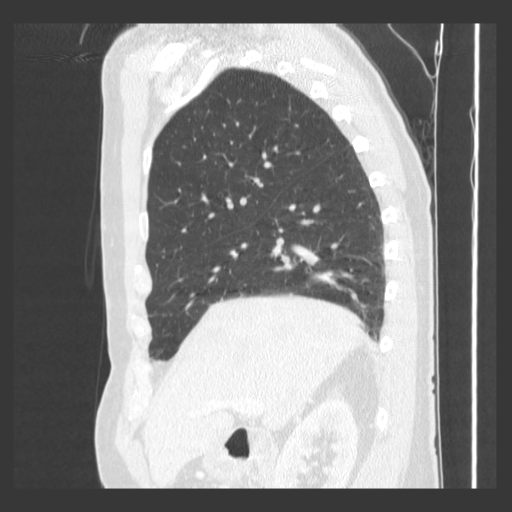
[im 56/134  lung]
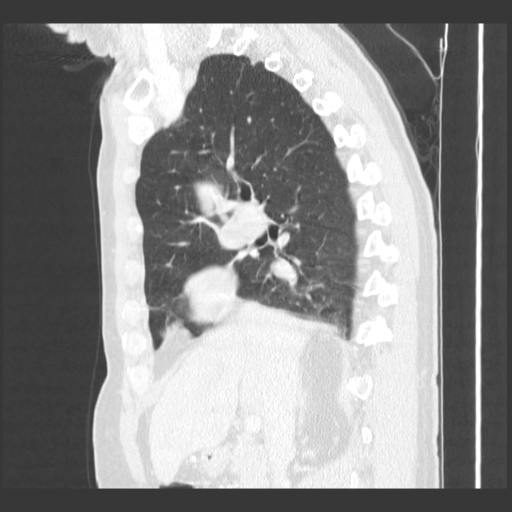
[im 78/134  lung]
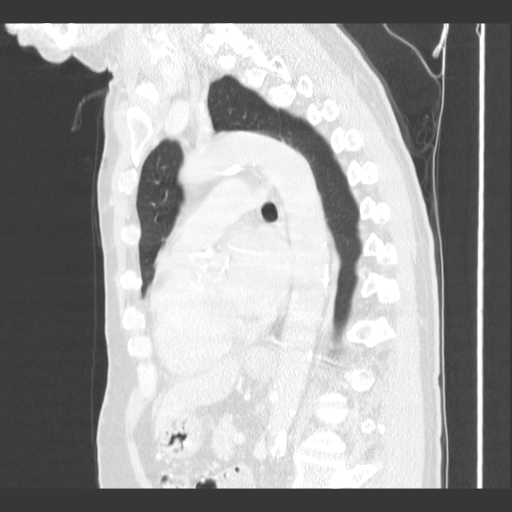
[im 89/134  lung]
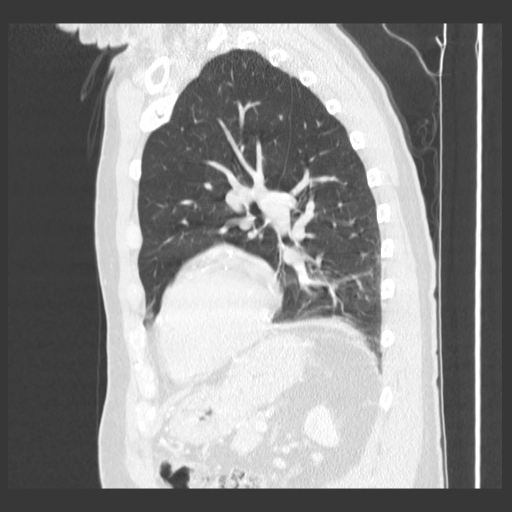
[im 100/134  lung]
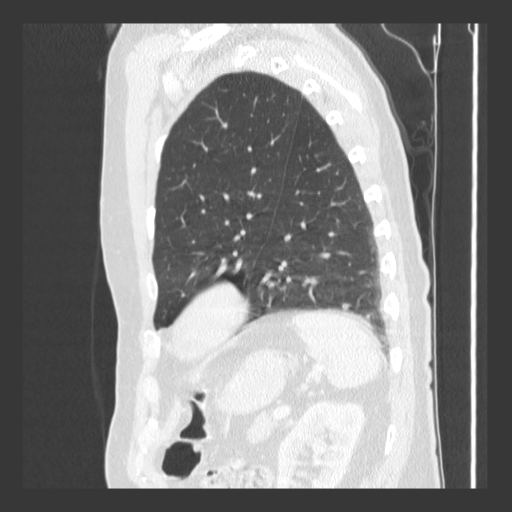
[im 122/134  lung]
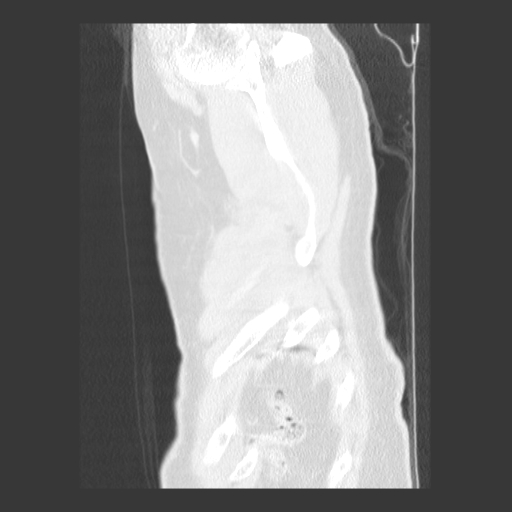

[Series 401: coronal · coronal · 0.70mm/px · 4 of 97 slices shown]
[im 13/97  lung]
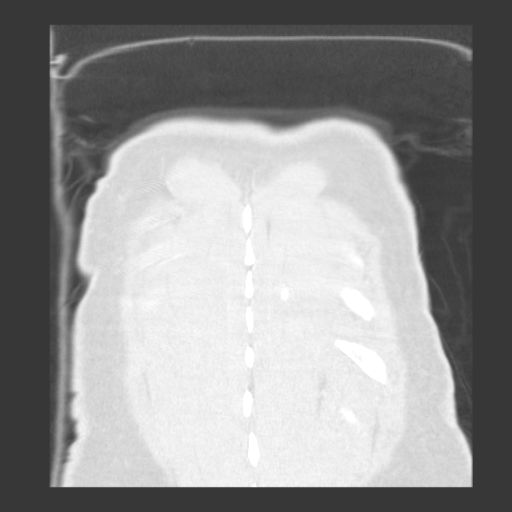
[im 25/97  lung]
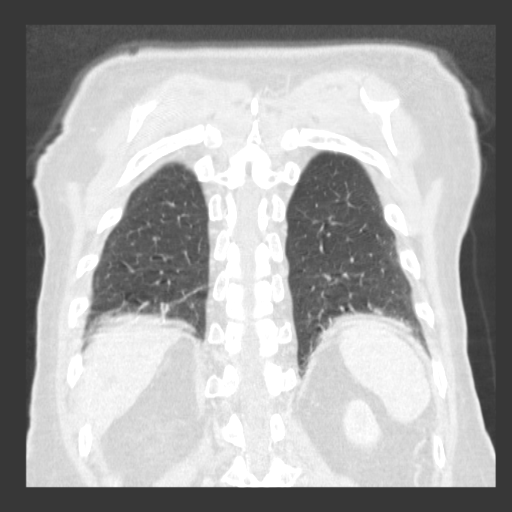
[im 37/97  lung]
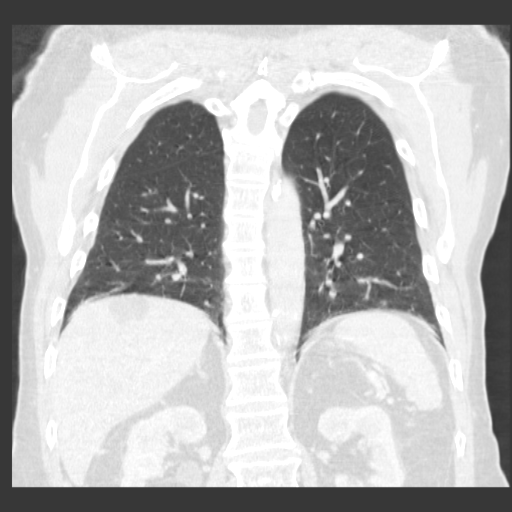
[im 49/97  lung]
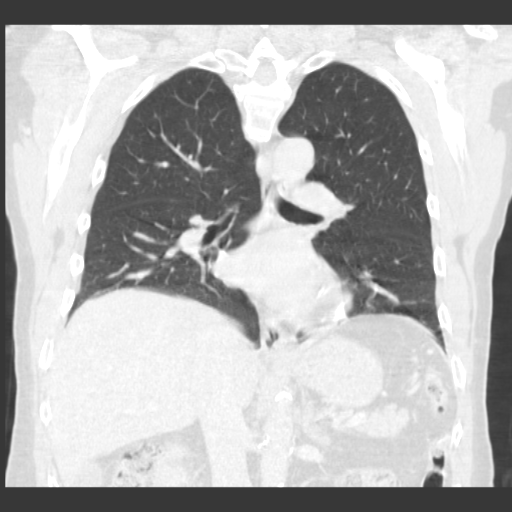

[17 of 29 positions shown; findings below may reference images not displayed]

FINDINGS: There is no pleural effusion.  There is no airspace
consolidation.  Stable right upper lobe nodule measuring 3 mm
measuring 5 mm, image 35/series 3.  Stable subpleural nodule in the
left lower lobe measuring 4 mm, image 44/series 3.  The 4 mm sub
solid nodule in the superior segment of the right lower lobe
appears new, image 22/series 3.

The trachea appears patent and is midline.  The heart size appears
normal.  No pericardial effusion.  No enlarged mediastinal or hilar
lymph nodes.

There is no axillary or supraclavicular adenopathy.  Multiple low
attenuation lesions within the liver are identified and appear
similar to 09/20/2008.  These likely represent benign cyst.

The adrenal glands are both normal.

Review of the visualized osseous structures is unremarkable.
IMPRESSION: 1.  No acute findings.
2. Solid nodules within the right upper lobe and left lower lobe
are unchanged from 3800 and most likely benign.
3.  4 mm sub solid nodule in the superior segment of right lower
lobe is identified. No further follow-up of this nodule is
necessary. This recommendation follows the consensus statement:
Recommendations for the Management of Subsolid Pulmonary Nodules
Detected at CT:  A Statement from the [HOSPITAL].

## 2015-06-21 DIAGNOSIS — R262 Difficulty in walking, not elsewhere classified: Secondary | ICD-10-CM | POA: Diagnosis not present

## 2015-06-21 DIAGNOSIS — M25562 Pain in left knee: Secondary | ICD-10-CM | POA: Diagnosis not present

## 2015-06-21 DIAGNOSIS — M1712 Unilateral primary osteoarthritis, left knee: Secondary | ICD-10-CM | POA: Diagnosis not present

## 2015-06-21 DIAGNOSIS — M6281 Muscle weakness (generalized): Secondary | ICD-10-CM | POA: Diagnosis not present

## 2015-06-25 DIAGNOSIS — R262 Difficulty in walking, not elsewhere classified: Secondary | ICD-10-CM | POA: Diagnosis not present

## 2015-06-25 DIAGNOSIS — M1712 Unilateral primary osteoarthritis, left knee: Secondary | ICD-10-CM | POA: Diagnosis not present

## 2015-06-25 DIAGNOSIS — M25562 Pain in left knee: Secondary | ICD-10-CM | POA: Diagnosis not present

## 2015-06-25 DIAGNOSIS — M6281 Muscle weakness (generalized): Secondary | ICD-10-CM | POA: Diagnosis not present

## 2015-06-27 DIAGNOSIS — M25562 Pain in left knee: Secondary | ICD-10-CM | POA: Diagnosis not present

## 2015-06-27 DIAGNOSIS — R262 Difficulty in walking, not elsewhere classified: Secondary | ICD-10-CM | POA: Diagnosis not present

## 2015-06-27 DIAGNOSIS — M1712 Unilateral primary osteoarthritis, left knee: Secondary | ICD-10-CM | POA: Diagnosis not present

## 2015-06-27 DIAGNOSIS — M6281 Muscle weakness (generalized): Secondary | ICD-10-CM | POA: Diagnosis not present

## 2015-07-02 DIAGNOSIS — M25562 Pain in left knee: Secondary | ICD-10-CM | POA: Diagnosis not present

## 2015-07-02 DIAGNOSIS — M1712 Unilateral primary osteoarthritis, left knee: Secondary | ICD-10-CM | POA: Diagnosis not present

## 2015-07-02 DIAGNOSIS — M6281 Muscle weakness (generalized): Secondary | ICD-10-CM | POA: Diagnosis not present

## 2015-07-02 DIAGNOSIS — R262 Difficulty in walking, not elsewhere classified: Secondary | ICD-10-CM | POA: Diagnosis not present

## 2015-07-04 DIAGNOSIS — M6281 Muscle weakness (generalized): Secondary | ICD-10-CM | POA: Diagnosis not present

## 2015-07-04 DIAGNOSIS — R262 Difficulty in walking, not elsewhere classified: Secondary | ICD-10-CM | POA: Diagnosis not present

## 2015-07-04 DIAGNOSIS — M25562 Pain in left knee: Secondary | ICD-10-CM | POA: Diagnosis not present

## 2015-07-04 DIAGNOSIS — M1712 Unilateral primary osteoarthritis, left knee: Secondary | ICD-10-CM | POA: Diagnosis not present

## 2015-07-12 ENCOUNTER — Ambulatory Visit: Payer: Medicare Other | Admitting: Internal Medicine

## 2015-07-16 DIAGNOSIS — N5201 Erectile dysfunction due to arterial insufficiency: Secondary | ICD-10-CM | POA: Diagnosis not present

## 2015-07-16 DIAGNOSIS — R3915 Urgency of urination: Secondary | ICD-10-CM | POA: Diagnosis not present

## 2015-07-16 DIAGNOSIS — N401 Enlarged prostate with lower urinary tract symptoms: Secondary | ICD-10-CM | POA: Diagnosis not present

## 2015-07-16 DIAGNOSIS — N138 Other obstructive and reflux uropathy: Secondary | ICD-10-CM | POA: Diagnosis not present

## 2015-07-16 DIAGNOSIS — R351 Nocturia: Secondary | ICD-10-CM | POA: Diagnosis not present

## 2015-07-18 DIAGNOSIS — M1712 Unilateral primary osteoarthritis, left knee: Secondary | ICD-10-CM | POA: Diagnosis not present

## 2015-07-18 DIAGNOSIS — R262 Difficulty in walking, not elsewhere classified: Secondary | ICD-10-CM | POA: Diagnosis not present

## 2015-07-18 DIAGNOSIS — E291 Testicular hypofunction: Secondary | ICD-10-CM | POA: Diagnosis not present

## 2015-07-18 DIAGNOSIS — M810 Age-related osteoporosis without current pathological fracture: Secondary | ICD-10-CM | POA: Diagnosis not present

## 2015-07-18 DIAGNOSIS — Z125 Encounter for screening for malignant neoplasm of prostate: Secondary | ICD-10-CM | POA: Diagnosis not present

## 2015-07-18 DIAGNOSIS — I10 Essential (primary) hypertension: Secondary | ICD-10-CM | POA: Diagnosis not present

## 2015-07-18 DIAGNOSIS — M25562 Pain in left knee: Secondary | ICD-10-CM | POA: Diagnosis not present

## 2015-07-18 DIAGNOSIS — M6281 Muscle weakness (generalized): Secondary | ICD-10-CM | POA: Diagnosis not present

## 2015-07-18 DIAGNOSIS — E785 Hyperlipidemia, unspecified: Secondary | ICD-10-CM | POA: Diagnosis not present

## 2015-07-20 ENCOUNTER — Ambulatory Visit (INDEPENDENT_AMBULATORY_CARE_PROVIDER_SITE_OTHER): Payer: Medicare Other | Admitting: Internal Medicine

## 2015-07-20 ENCOUNTER — Encounter: Payer: Self-pay | Admitting: Internal Medicine

## 2015-07-20 VITALS — BP 128/78 | HR 82 | Ht 68.0 in | Wt 179.3 lb

## 2015-07-20 DIAGNOSIS — I350 Nonrheumatic aortic (valve) stenosis: Secondary | ICD-10-CM

## 2015-07-20 DIAGNOSIS — E785 Hyperlipidemia, unspecified: Secondary | ICD-10-CM | POA: Diagnosis not present

## 2015-07-20 DIAGNOSIS — Z79899 Other long term (current) drug therapy: Secondary | ICD-10-CM | POA: Diagnosis not present

## 2015-07-20 DIAGNOSIS — R0609 Other forms of dyspnea: Secondary | ICD-10-CM | POA: Diagnosis not present

## 2015-07-20 DIAGNOSIS — I10 Essential (primary) hypertension: Secondary | ICD-10-CM | POA: Diagnosis not present

## 2015-07-20 DIAGNOSIS — I2510 Atherosclerotic heart disease of native coronary artery without angina pectoris: Secondary | ICD-10-CM

## 2015-07-20 DIAGNOSIS — I2583 Coronary atherosclerosis due to lipid rich plaque: Secondary | ICD-10-CM

## 2015-07-20 LAB — BASIC METABOLIC PANEL
BUN: 21 mg/dL (ref 7–25)
CALCIUM: 9.5 mg/dL (ref 8.6–10.3)
CHLORIDE: 103 mmol/L (ref 98–110)
CO2: 30 mmol/L (ref 20–31)
CREATININE: 1.38 mg/dL — AB (ref 0.70–1.18)
GLUCOSE: 94 mg/dL (ref 65–99)
Potassium: 4.5 mmol/L (ref 3.5–5.3)
Sodium: 140 mmol/L (ref 135–146)

## 2015-07-20 NOTE — Patient Instructions (Signed)
Your physician recommends that you return for lab work TODAY.  Dr Debara Pickett recommends that you schedule a follow-up appointment in 6 months. You will receive a reminder letter in the mail two months in advance. If you don't receive a letter, please call our office to schedule the follow-up appointment.

## 2015-07-20 NOTE — Progress Notes (Signed)
OFFICE NOTE  Chief Complaint:  DOE  Primary Care Physician: Thressa Sheller, MD  HPI:  Jeff Rivera is a 74 year old gentleman with history of hypertension, dyslipidemia and known coronary disease. He had a 40% LAD lesion by cath in 2011. He also has mild aortic stenosis with a valve area of 1.8. He had a repeat echocardiogram in December 2012 which showed an EF of greater than 55% and peak gradient of 15 mmHg, mean gradient of 10, valve area of 1.8. This does seem consistent with the sound of his aortic stenosis by auscultation. Recently he's had some problems with shortness of breath, especially when walking up a hill. His primary care provider ordered an exercise treadmill stress test as well as an echocardiogram. He did undergo exercise treadmill stress testing which was discontinued fairly early possibly due to hypertensive response, but is not clear as he felt that he could exercise further. Nevertheless the test was nonconclusive. He also underwent a repeat echocardiogram which showed a normal systolic function, and mild to moderate aortic stenosis with a valve area of approximately 1.3-1.4, which may be slightly worse than it was in 2012.  He does report a significant change in his symptoms with shortness of breath on exercise. It is not clear whether this is due to decreased activity or could represent coronary ischemia. Because of his poor performance and the fact that he did not complete a treadmill exercise stress test, I think there is still concern for possible ischemia.  I saw Mr. Montefusco in the office today for follow-up. He reports some worsening shortness of breath with exertion. He does get some slight swelling in his legs. He recently had a repeat echocardiogram which now shows moderate aortic stenosis with a calcified aortic valve. There is also moderate aortic insufficiency. The insufficiency I'm concerned could be leading to his shortness of breath. He also has had a small amount  of weight gain and slight swelling in the lower extremities. This could be due to degree of volume overload of the left ventricle.  PMHx:  Past Medical History  Diagnosis Date  . Hypertension   . Dyslipidemia   . CAD (coronary artery disease)   . Aortic stenosis     mild   . ED (erectile dysfunction)   . GERD (gastroesophageal reflux disease)   . Arthritis     OA    Past Surgical History  Procedure Laterality Date  . Cardiac catheterization  01/2010    40% LAD lesion (Dr. Loni Muse. [facility])   . Transthoracic echocardiogram  09/2011    EF=>55%, mild conc LVH; normal RV systolic function; LA mildly dilated, IVC borderline dilated with collapse >50%; mild mitral annular calcif & mild MR; trace TR, elevated RVSP; AV mildly sclerotic, mild calcif of AV leaflets, mild valvular AS, mild regurg; trace pulm valve regurg  . Eye surgery Bilateral 2013    LENS REPLACMENT FOR CATRACTS  . 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 Total knee arthroplasty Left 10/27/2014    Procedure: LEFT TOTAL KNEE ARTHROPLASTY;  Surgeon: Mcarthur Rossetti, MD;  Location: [facility];  Service: Orthopedics;  Laterality: Left;    FAMHx:  Family History  Problem Relation Age of Onset  . Heart attack Father   . Coronary artery disease Father     SOCHx:   reports that he quit smoking about 53 years ago. His smoking use included Cigarettes. He has never used smokeless tobacco. He reports that he drinks  about 0.5 oz of alcohol per week. He reports that he does not use illicit drugs.  ALLERGIES:  No Known Allergies  ROS: A comprehensive review of systems was negative except for: Respiratory: positive for dyspnea on exertion  HOME MEDS: Current Outpatient Prescriptions  Medication Sig Dispense Refill  . alfuzosin (UROXATRAL) 10 MG 24 hr tablet Take 1 tablet by mouth daily.    Marland Kitchen aspirin 81 MG tablet Take 81 mg by mouth daily.    . diphenhydrAMINE (SOMINEX) 25 MG tablet Take 25 mg by mouth at bedtime as needed for  sleep.    Marland Kitchen lisinopril (PRINIVIL,ZESTRIL) 10 MG tablet Take 10 mg by mouth every morning.     Marland Kitchen MAGNESIUM PO Take 400 mg by mouth daily.     . meloxicam (MOBIC) 7.5 MG tablet Take 1 tablet by mouth daily as needed.    . Multiple Vitamin (MULTIVITAMIN) tablet Take 1 tablet by mouth daily.    . niacin 500 MG tablet Take 500 mg by mouth 2 (two) times daily with a meal.    . Omega-3 Fatty Acids (FISH OIL) 1200 MG CAPS Take 1 capsule by mouth daily.    Marland Kitchen omeprazole (PRILOSEC) 20 MG capsule Take 20 mg by mouth daily.    . simvastatin (ZOCOR) 20 MG tablet Take 20 mg by mouth every evening.      No current facility-administered medications for this visit.    LABS/IMAGING: No results found for this or any previous visit (from the past 48 hour(s)). No results found.  VITALS: BP 128/78 mmHg  Pulse 82  Ht 5\' 8"  (1.727 m)  Wt 179 lb 4.8 oz (81.33 kg)  BMI 27.27 kg/m2  EXAM: General appearance: alert and no distress Neck: no carotid bruit and no JVD Lungs: clear to auscultation bilaterally Heart: regular rate and rhythm, S1, S2 normal and systolic murmur: early systolic 3/6, crescendo at 2nd right intercostal space Abdomen: soft, non-tender; bowel sounds normal; no masses,  no organomegaly Extremities: edema Trace to 1+ bilateral pitting edema Pulses: 2+ and symmetric Skin: Skin color, texture, turgor normal. No rashes or lesions Neurologic: Grossly normal Psych: Mood, affect normal  EKG: Normal sinus rhythm 82, possible left atrial enlargement  ASSESSMENT: 1. Coronary artery disease with a known 40% LAD lesion 2. Moderate aortic stenosis with moderate aortic insufficiency 3. Dyslipidemia 4. Hypertension 5. Dyspnea on exertion  PLAN: 1.   Mr. Papadopoulos has had some worsening shortness of breath on exertion. His echo shows moderate aortic stenosis with moderate aortic insufficiency. There are some signs of volume overload. I like to check a metabolic profile and BNP today, but suspect they  may want to start him on low-dose diuretics, possibly a thiazide. This may help a little with his breathlessness if there is volume overload and his swelling. He will need close follow-up of his aortic valve as it is likely to progress and eventually will require surgery.  Pixie Casino, MD, [facility] Attending Cardiologist Liberty C Hilty 07/20/2015, 1:33 PM

## 2015-07-23 ENCOUNTER — Telehealth: Payer: Self-pay | Admitting: Internal Medicine

## 2015-07-23 DIAGNOSIS — R0602 Shortness of breath: Secondary | ICD-10-CM

## 2015-07-23 DIAGNOSIS — Z5181 Encounter for therapeutic drug level monitoring: Secondary | ICD-10-CM

## 2015-07-23 NOTE — Telephone Encounter (Signed)
Reviewed chart. Called patient.  Dr. Debara Pickett saw him on 10/7 - pt had some pedal edema. Recommendation was for low-dose diuretic, thiazide - after check of labwork.  Informed patient I would route to Dr. Debara Pickett for recommendation. Lab results posted.  Pt states we can call in to [place] and give him notification of what Dr. Debara Pickett recommends.

## 2015-07-23 NOTE — Telephone Encounter (Signed)
Pt says he saw Dr Debara Pickett on 07-20-15. He was suppose to call in some fluid medicine for him,he thought. There is still nothing at the pharmacy.

## 2015-07-24 DIAGNOSIS — N183 Chronic kidney disease, stage 3 (moderate): Secondary | ICD-10-CM | POA: Diagnosis not present

## 2015-07-24 DIAGNOSIS — E785 Hyperlipidemia, unspecified: Secondary | ICD-10-CM | POA: Diagnosis not present

## 2015-07-24 DIAGNOSIS — I5032 Chronic diastolic (congestive) heart failure: Secondary | ICD-10-CM | POA: Diagnosis not present

## 2015-07-24 DIAGNOSIS — F17211 Nicotine dependence, cigarettes, in remission: Secondary | ICD-10-CM | POA: Diagnosis not present

## 2015-07-24 DIAGNOSIS — I129 Hypertensive chronic kidney disease with stage 1 through stage 4 chronic kidney disease, or unspecified chronic kidney disease: Secondary | ICD-10-CM | POA: Diagnosis not present

## 2015-07-24 DIAGNOSIS — Z23 Encounter for immunization: Secondary | ICD-10-CM | POA: Diagnosis not present

## 2015-07-25 DIAGNOSIS — M6281 Muscle weakness (generalized): Secondary | ICD-10-CM | POA: Diagnosis not present

## 2015-07-25 DIAGNOSIS — M1712 Unilateral primary osteoarthritis, left knee: Secondary | ICD-10-CM | POA: Diagnosis not present

## 2015-07-25 DIAGNOSIS — M25562 Pain in left knee: Secondary | ICD-10-CM | POA: Diagnosis not present

## 2015-07-25 DIAGNOSIS — R262 Difficulty in walking, not elsewhere classified: Secondary | ICD-10-CM | POA: Diagnosis not present

## 2015-07-27 DIAGNOSIS — M6281 Muscle weakness (generalized): Secondary | ICD-10-CM | POA: Diagnosis not present

## 2015-07-27 DIAGNOSIS — M25562 Pain in left knee: Secondary | ICD-10-CM | POA: Diagnosis not present

## 2015-07-27 DIAGNOSIS — M1712 Unilateral primary osteoarthritis, left knee: Secondary | ICD-10-CM | POA: Diagnosis not present

## 2015-07-27 DIAGNOSIS — R262 Difficulty in walking, not elsewhere classified: Secondary | ICD-10-CM | POA: Diagnosis not present

## 2015-07-30 NOTE — Telephone Encounter (Signed)
Creatinine is mildly elevated, never got the BNP. Was waiting on that. Could try hctz 12.5 mg daily, repeat BMET in 1 week.  Dr. Lemmie Evens

## 2015-07-31 MED ORDER — HYDROCHLOROTHIAZIDE 12.5 MG PO CAPS: 12.5000 mg | ORAL_CAPSULE | Freq: Every day | ORAL | Status: DC

## 2015-07-31 NOTE — Telephone Encounter (Signed)
Spoke to patient, gave instructions on starting med and rechecking labs in 1 week. Medication ordered to pt's preferred pharmacy. Labs ordered, slip mailed to patient. Understanding verbalized. Instructed to call if further needs.

## 2015-07-31 NOTE — Addendum Note (Signed)
Addended by: Fidel Levy on: 07/31/2015 09:11 AM   Modules accepted: Orders

## 2015-07-31 NOTE — Telephone Encounter (Signed)
BNP also ordered as it was not ordered at [facility] on 07/20/15

## 2015-08-01 DIAGNOSIS — M6281 Muscle weakness (generalized): Secondary | ICD-10-CM | POA: Diagnosis not present

## 2015-08-01 DIAGNOSIS — M1712 Unilateral primary osteoarthritis, left knee: Secondary | ICD-10-CM | POA: Diagnosis not present

## 2015-08-01 DIAGNOSIS — M25562 Pain in left knee: Secondary | ICD-10-CM | POA: Diagnosis not present

## 2015-08-01 DIAGNOSIS — R262 Difficulty in walking, not elsewhere classified: Secondary | ICD-10-CM | POA: Diagnosis not present

## 2015-08-07 DIAGNOSIS — M25562 Pain in left knee: Secondary | ICD-10-CM | POA: Diagnosis not present

## 2015-08-07 DIAGNOSIS — R262 Difficulty in walking, not elsewhere classified: Secondary | ICD-10-CM | POA: Diagnosis not present

## 2015-08-07 DIAGNOSIS — M1712 Unilateral primary osteoarthritis, left knee: Secondary | ICD-10-CM | POA: Diagnosis not present

## 2015-08-07 DIAGNOSIS — M6281 Muscle weakness (generalized): Secondary | ICD-10-CM | POA: Diagnosis not present

## 2015-08-09 DIAGNOSIS — R262 Difficulty in walking, not elsewhere classified: Secondary | ICD-10-CM | POA: Diagnosis not present

## 2015-08-09 DIAGNOSIS — M1712 Unilateral primary osteoarthritis, left knee: Secondary | ICD-10-CM | POA: Diagnosis not present

## 2015-08-09 DIAGNOSIS — M6281 Muscle weakness (generalized): Secondary | ICD-10-CM | POA: Diagnosis not present

## 2015-08-09 DIAGNOSIS — M25562 Pain in left knee: Secondary | ICD-10-CM | POA: Diagnosis not present

## 2015-08-15 DIAGNOSIS — R05 Cough: Secondary | ICD-10-CM | POA: Diagnosis not present

## 2015-08-15 DIAGNOSIS — R509 Fever, unspecified: Secondary | ICD-10-CM | POA: Diagnosis not present

## 2015-08-16 DIAGNOSIS — N390 Urinary tract infection, site not specified: Secondary | ICD-10-CM | POA: Diagnosis not present

## 2015-08-16 DIAGNOSIS — N3941 Urge incontinence: Secondary | ICD-10-CM | POA: Diagnosis not present

## 2015-08-16 DIAGNOSIS — B9620 Unspecified Escherichia coli [E. coli] as the cause of diseases classified elsewhere: Secondary | ICD-10-CM | POA: Diagnosis not present

## 2015-09-17 DIAGNOSIS — R3915 Urgency of urination: Secondary | ICD-10-CM | POA: Diagnosis not present

## 2015-09-17 DIAGNOSIS — N138 Other obstructive and reflux uropathy: Secondary | ICD-10-CM | POA: Diagnosis not present

## 2015-09-17 DIAGNOSIS — R351 Nocturia: Secondary | ICD-10-CM | POA: Diagnosis not present

## 2015-09-17 DIAGNOSIS — N401 Enlarged prostate with lower urinary tract symptoms: Secondary | ICD-10-CM | POA: Diagnosis not present

## 2015-10-18 DIAGNOSIS — K219 Gastro-esophageal reflux disease without esophagitis: Secondary | ICD-10-CM | POA: Diagnosis not present

## 2015-10-18 DIAGNOSIS — J029 Acute pharyngitis, unspecified: Secondary | ICD-10-CM | POA: Diagnosis not present

## 2015-10-24 DIAGNOSIS — J039 Acute tonsillitis, unspecified: Secondary | ICD-10-CM | POA: Diagnosis not present

## 2015-10-24 DIAGNOSIS — H903 Sensorineural hearing loss, bilateral: Secondary | ICD-10-CM | POA: Diagnosis not present

## 2015-10-24 DIAGNOSIS — J351 Hypertrophy of tonsils: Secondary | ICD-10-CM | POA: Diagnosis not present

## 2015-10-24 DIAGNOSIS — H6122 Impacted cerumen, left ear: Secondary | ICD-10-CM | POA: Diagnosis not present

## 2015-10-29 DIAGNOSIS — M1711 Unilateral primary osteoarthritis, right knee: Secondary | ICD-10-CM | POA: Diagnosis not present

## 2015-10-29 DIAGNOSIS — M1712 Unilateral primary osteoarthritis, left knee: Secondary | ICD-10-CM | POA: Diagnosis not present

## 2015-10-29 DIAGNOSIS — Z96652 Presence of left artificial knee joint: Secondary | ICD-10-CM | POA: Diagnosis not present

## 2015-11-15 ENCOUNTER — Other Ambulatory Visit: Payer: Self-pay | Admitting: Adult Health

## 2015-11-20 DIAGNOSIS — M6281 Muscle weakness (generalized): Secondary | ICD-10-CM | POA: Diagnosis not present

## 2015-11-20 DIAGNOSIS — R262 Difficulty in walking, not elsewhere classified: Secondary | ICD-10-CM | POA: Diagnosis not present

## 2015-11-20 DIAGNOSIS — M25562 Pain in left knee: Secondary | ICD-10-CM | POA: Diagnosis not present

## 2015-11-20 DIAGNOSIS — M25561 Pain in right knee: Secondary | ICD-10-CM | POA: Diagnosis not present

## 2015-11-22 DIAGNOSIS — M25561 Pain in right knee: Secondary | ICD-10-CM | POA: Diagnosis not present

## 2015-11-22 DIAGNOSIS — R262 Difficulty in walking, not elsewhere classified: Secondary | ICD-10-CM | POA: Diagnosis not present

## 2015-11-22 DIAGNOSIS — M6281 Muscle weakness (generalized): Secondary | ICD-10-CM | POA: Diagnosis not present

## 2015-11-22 DIAGNOSIS — M25562 Pain in left knee: Secondary | ICD-10-CM | POA: Diagnosis not present

## 2015-11-27 DIAGNOSIS — M25561 Pain in right knee: Secondary | ICD-10-CM | POA: Diagnosis not present

## 2015-11-27 DIAGNOSIS — R262 Difficulty in walking, not elsewhere classified: Secondary | ICD-10-CM | POA: Diagnosis not present

## 2015-11-27 DIAGNOSIS — M25562 Pain in left knee: Secondary | ICD-10-CM | POA: Diagnosis not present

## 2015-11-27 DIAGNOSIS — M6281 Muscle weakness (generalized): Secondary | ICD-10-CM | POA: Diagnosis not present

## 2015-11-30 DIAGNOSIS — M25561 Pain in right knee: Secondary | ICD-10-CM | POA: Diagnosis not present

## 2015-11-30 DIAGNOSIS — M6281 Muscle weakness (generalized): Secondary | ICD-10-CM | POA: Diagnosis not present

## 2015-11-30 DIAGNOSIS — M25562 Pain in left knee: Secondary | ICD-10-CM | POA: Diagnosis not present

## 2015-11-30 DIAGNOSIS — R262 Difficulty in walking, not elsewhere classified: Secondary | ICD-10-CM | POA: Diagnosis not present

## 2015-12-05 DIAGNOSIS — M25561 Pain in right knee: Secondary | ICD-10-CM | POA: Diagnosis not present

## 2015-12-05 DIAGNOSIS — M6281 Muscle weakness (generalized): Secondary | ICD-10-CM | POA: Diagnosis not present

## 2015-12-05 DIAGNOSIS — R262 Difficulty in walking, not elsewhere classified: Secondary | ICD-10-CM | POA: Diagnosis not present

## 2015-12-05 DIAGNOSIS — M25562 Pain in left knee: Secondary | ICD-10-CM | POA: Diagnosis not present

## 2015-12-10 DIAGNOSIS — M6281 Muscle weakness (generalized): Secondary | ICD-10-CM | POA: Diagnosis not present

## 2015-12-10 DIAGNOSIS — M25562 Pain in left knee: Secondary | ICD-10-CM | POA: Diagnosis not present

## 2015-12-10 DIAGNOSIS — R262 Difficulty in walking, not elsewhere classified: Secondary | ICD-10-CM | POA: Diagnosis not present

## 2015-12-10 DIAGNOSIS — M25561 Pain in right knee: Secondary | ICD-10-CM | POA: Diagnosis not present

## 2015-12-12 DIAGNOSIS — M25562 Pain in left knee: Secondary | ICD-10-CM | POA: Diagnosis not present

## 2015-12-12 DIAGNOSIS — M25561 Pain in right knee: Secondary | ICD-10-CM | POA: Diagnosis not present

## 2015-12-12 DIAGNOSIS — M6281 Muscle weakness (generalized): Secondary | ICD-10-CM | POA: Diagnosis not present

## 2015-12-12 DIAGNOSIS — R262 Difficulty in walking, not elsewhere classified: Secondary | ICD-10-CM | POA: Diagnosis not present

## 2015-12-18 DIAGNOSIS — M25562 Pain in left knee: Secondary | ICD-10-CM | POA: Diagnosis not present

## 2015-12-18 DIAGNOSIS — R262 Difficulty in walking, not elsewhere classified: Secondary | ICD-10-CM | POA: Diagnosis not present

## 2015-12-18 DIAGNOSIS — M25561 Pain in right knee: Secondary | ICD-10-CM | POA: Diagnosis not present

## 2015-12-18 DIAGNOSIS — M6281 Muscle weakness (generalized): Secondary | ICD-10-CM | POA: Diagnosis not present

## 2015-12-20 DIAGNOSIS — M25561 Pain in right knee: Secondary | ICD-10-CM | POA: Diagnosis not present

## 2015-12-20 DIAGNOSIS — M25562 Pain in left knee: Secondary | ICD-10-CM | POA: Diagnosis not present

## 2015-12-20 DIAGNOSIS — R262 Difficulty in walking, not elsewhere classified: Secondary | ICD-10-CM | POA: Diagnosis not present

## 2015-12-20 DIAGNOSIS — M6281 Muscle weakness (generalized): Secondary | ICD-10-CM | POA: Diagnosis not present

## 2015-12-24 DIAGNOSIS — M25562 Pain in left knee: Secondary | ICD-10-CM | POA: Diagnosis not present

## 2015-12-24 DIAGNOSIS — M6281 Muscle weakness (generalized): Secondary | ICD-10-CM | POA: Diagnosis not present

## 2015-12-24 DIAGNOSIS — M25561 Pain in right knee: Secondary | ICD-10-CM | POA: Diagnosis not present

## 2015-12-24 DIAGNOSIS — R262 Difficulty in walking, not elsewhere classified: Secondary | ICD-10-CM | POA: Diagnosis not present

## 2015-12-26 DIAGNOSIS — M25561 Pain in right knee: Secondary | ICD-10-CM | POA: Diagnosis not present

## 2015-12-26 DIAGNOSIS — R262 Difficulty in walking, not elsewhere classified: Secondary | ICD-10-CM | POA: Diagnosis not present

## 2015-12-26 DIAGNOSIS — M25562 Pain in left knee: Secondary | ICD-10-CM | POA: Diagnosis not present

## 2015-12-26 DIAGNOSIS — M6281 Muscle weakness (generalized): Secondary | ICD-10-CM | POA: Diagnosis not present

## 2016-01-08 DIAGNOSIS — M25562 Pain in left knee: Secondary | ICD-10-CM | POA: Diagnosis not present

## 2016-01-08 DIAGNOSIS — M6281 Muscle weakness (generalized): Secondary | ICD-10-CM | POA: Diagnosis not present

## 2016-01-08 DIAGNOSIS — R262 Difficulty in walking, not elsewhere classified: Secondary | ICD-10-CM | POA: Diagnosis not present

## 2016-01-08 DIAGNOSIS — M25561 Pain in right knee: Secondary | ICD-10-CM | POA: Diagnosis not present

## 2016-01-10 DIAGNOSIS — M25562 Pain in left knee: Secondary | ICD-10-CM | POA: Diagnosis not present

## 2016-01-10 DIAGNOSIS — M25561 Pain in right knee: Secondary | ICD-10-CM | POA: Diagnosis not present

## 2016-01-10 DIAGNOSIS — R262 Difficulty in walking, not elsewhere classified: Secondary | ICD-10-CM | POA: Diagnosis not present

## 2016-01-10 DIAGNOSIS — M6281 Muscle weakness (generalized): Secondary | ICD-10-CM | POA: Diagnosis not present

## 2016-01-15 DIAGNOSIS — E785 Hyperlipidemia, unspecified: Secondary | ICD-10-CM | POA: Diagnosis not present

## 2016-01-15 DIAGNOSIS — I5032 Chronic diastolic (congestive) heart failure: Secondary | ICD-10-CM | POA: Diagnosis not present

## 2016-01-15 DIAGNOSIS — M810 Age-related osteoporosis without current pathological fracture: Secondary | ICD-10-CM | POA: Diagnosis not present

## 2016-01-15 DIAGNOSIS — R262 Difficulty in walking, not elsewhere classified: Secondary | ICD-10-CM | POA: Diagnosis not present

## 2016-01-15 DIAGNOSIS — M25561 Pain in right knee: Secondary | ICD-10-CM | POA: Diagnosis not present

## 2016-01-15 DIAGNOSIS — I10 Essential (primary) hypertension: Secondary | ICD-10-CM | POA: Diagnosis not present

## 2016-01-15 DIAGNOSIS — M25562 Pain in left knee: Secondary | ICD-10-CM | POA: Diagnosis not present

## 2016-01-15 DIAGNOSIS — M6281 Muscle weakness (generalized): Secondary | ICD-10-CM | POA: Diagnosis not present

## 2016-01-15 DIAGNOSIS — R7309 Other abnormal glucose: Secondary | ICD-10-CM | POA: Diagnosis not present

## 2016-01-17 DIAGNOSIS — Z961 Presence of intraocular lens: Secondary | ICD-10-CM | POA: Diagnosis not present

## 2016-01-17 DIAGNOSIS — Z0100 Encounter for examination of eyes and vision without abnormal findings: Secondary | ICD-10-CM | POA: Diagnosis not present

## 2016-01-21 DIAGNOSIS — M6281 Muscle weakness (generalized): Secondary | ICD-10-CM | POA: Diagnosis not present

## 2016-01-21 DIAGNOSIS — M25561 Pain in right knee: Secondary | ICD-10-CM | POA: Diagnosis not present

## 2016-01-21 DIAGNOSIS — R262 Difficulty in walking, not elsewhere classified: Secondary | ICD-10-CM | POA: Diagnosis not present

## 2016-01-21 DIAGNOSIS — M25562 Pain in left knee: Secondary | ICD-10-CM | POA: Diagnosis not present

## 2016-01-22 DIAGNOSIS — I129 Hypertensive chronic kidney disease with stage 1 through stage 4 chronic kidney disease, or unspecified chronic kidney disease: Secondary | ICD-10-CM | POA: Diagnosis not present

## 2016-01-22 DIAGNOSIS — I5032 Chronic diastolic (congestive) heart failure: Secondary | ICD-10-CM | POA: Diagnosis not present

## 2016-01-22 DIAGNOSIS — I2510 Atherosclerotic heart disease of native coronary artery without angina pectoris: Secondary | ICD-10-CM | POA: Diagnosis not present

## 2016-01-22 DIAGNOSIS — R7309 Other abnormal glucose: Secondary | ICD-10-CM | POA: Diagnosis not present

## 2016-01-22 DIAGNOSIS — F17211 Nicotine dependence, cigarettes, in remission: Secondary | ICD-10-CM | POA: Diagnosis not present

## 2016-01-28 DIAGNOSIS — M25562 Pain in left knee: Secondary | ICD-10-CM | POA: Diagnosis not present

## 2016-01-28 DIAGNOSIS — R262 Difficulty in walking, not elsewhere classified: Secondary | ICD-10-CM | POA: Diagnosis not present

## 2016-01-28 DIAGNOSIS — M25561 Pain in right knee: Secondary | ICD-10-CM | POA: Diagnosis not present

## 2016-01-28 DIAGNOSIS — M6281 Muscle weakness (generalized): Secondary | ICD-10-CM | POA: Diagnosis not present

## 2016-02-04 DIAGNOSIS — M6281 Muscle weakness (generalized): Secondary | ICD-10-CM | POA: Diagnosis not present

## 2016-02-04 DIAGNOSIS — M25562 Pain in left knee: Secondary | ICD-10-CM | POA: Diagnosis not present

## 2016-02-04 DIAGNOSIS — M25561 Pain in right knee: Secondary | ICD-10-CM | POA: Diagnosis not present

## 2016-02-04 DIAGNOSIS — R262 Difficulty in walking, not elsewhere classified: Secondary | ICD-10-CM | POA: Diagnosis not present

## 2016-02-07 DIAGNOSIS — E7800 Pure hypercholesterolemia, unspecified: Secondary | ICD-10-CM | POA: Diagnosis not present

## 2016-02-07 DIAGNOSIS — I352 Nonrheumatic aortic (valve) stenosis with insufficiency: Secondary | ICD-10-CM | POA: Diagnosis not present

## 2016-02-07 DIAGNOSIS — R0609 Other forms of dyspnea: Secondary | ICD-10-CM | POA: Diagnosis not present

## 2016-02-07 DIAGNOSIS — R9439 Abnormal result of other cardiovascular function study: Secondary | ICD-10-CM | POA: Diagnosis not present

## 2016-02-11 DIAGNOSIS — R0602 Shortness of breath: Secondary | ICD-10-CM | POA: Diagnosis not present

## 2016-02-11 DIAGNOSIS — I10 Essential (primary) hypertension: Secondary | ICD-10-CM | POA: Diagnosis not present

## 2016-02-13 DIAGNOSIS — R262 Difficulty in walking, not elsewhere classified: Secondary | ICD-10-CM | POA: Diagnosis not present

## 2016-02-13 DIAGNOSIS — M25561 Pain in right knee: Secondary | ICD-10-CM | POA: Diagnosis not present

## 2016-02-13 DIAGNOSIS — M6281 Muscle weakness (generalized): Secondary | ICD-10-CM | POA: Diagnosis not present

## 2016-02-13 DIAGNOSIS — M25562 Pain in left knee: Secondary | ICD-10-CM | POA: Diagnosis not present

## 2016-02-20 DIAGNOSIS — R252 Cramp and spasm: Secondary | ICD-10-CM | POA: Diagnosis not present

## 2016-02-20 DIAGNOSIS — R0989 Other specified symptoms and signs involving the circulatory and respiratory systems: Secondary | ICD-10-CM | POA: Diagnosis not present

## 2016-02-20 DIAGNOSIS — I352 Nonrheumatic aortic (valve) stenosis with insufficiency: Secondary | ICD-10-CM | POA: Diagnosis not present

## 2016-02-21 DIAGNOSIS — M1711 Unilateral primary osteoarthritis, right knee: Secondary | ICD-10-CM | POA: Diagnosis not present

## 2016-03-07 DIAGNOSIS — I352 Nonrheumatic aortic (valve) stenosis with insufficiency: Secondary | ICD-10-CM | POA: Diagnosis not present

## 2016-03-07 DIAGNOSIS — R0609 Other forms of dyspnea: Secondary | ICD-10-CM | POA: Diagnosis not present

## 2016-03-07 DIAGNOSIS — E7800 Pure hypercholesterolemia, unspecified: Secondary | ICD-10-CM | POA: Diagnosis not present

## 2016-03-07 DIAGNOSIS — R9439 Abnormal result of other cardiovascular function study: Secondary | ICD-10-CM | POA: Diagnosis not present

## 2016-03-17 DIAGNOSIS — N401 Enlarged prostate with lower urinary tract symptoms: Secondary | ICD-10-CM | POA: Diagnosis not present

## 2016-03-17 DIAGNOSIS — R351 Nocturia: Secondary | ICD-10-CM | POA: Diagnosis not present

## 2016-04-22 DIAGNOSIS — Z1321 Encounter for screening for nutritional disorder: Secondary | ICD-10-CM | POA: Diagnosis not present

## 2016-04-22 DIAGNOSIS — I10 Essential (primary) hypertension: Secondary | ICD-10-CM | POA: Diagnosis not present

## 2016-04-22 DIAGNOSIS — Z0000 Encounter for general adult medical examination without abnormal findings: Secondary | ICD-10-CM | POA: Diagnosis not present

## 2016-04-22 DIAGNOSIS — I129 Hypertensive chronic kidney disease with stage 1 through stage 4 chronic kidney disease, or unspecified chronic kidney disease: Secondary | ICD-10-CM | POA: Diagnosis not present

## 2016-04-22 DIAGNOSIS — Z1389 Encounter for screening for other disorder: Secondary | ICD-10-CM | POA: Diagnosis not present

## 2016-04-22 DIAGNOSIS — Z125 Encounter for screening for malignant neoplasm of prostate: Secondary | ICD-10-CM | POA: Diagnosis not present

## 2016-04-22 DIAGNOSIS — R7309 Other abnormal glucose: Secondary | ICD-10-CM | POA: Diagnosis not present

## 2016-04-22 DIAGNOSIS — E559 Vitamin D deficiency, unspecified: Secondary | ICD-10-CM | POA: Diagnosis not present

## 2016-04-22 DIAGNOSIS — E663 Overweight: Secondary | ICD-10-CM | POA: Diagnosis not present

## 2016-04-24 DIAGNOSIS — M1711 Unilateral primary osteoarthritis, right knee: Secondary | ICD-10-CM | POA: Diagnosis not present

## 2016-04-24 DIAGNOSIS — M25562 Pain in left knee: Secondary | ICD-10-CM | POA: Diagnosis not present

## 2016-04-30 DIAGNOSIS — I5032 Chronic diastolic (congestive) heart failure: Secondary | ICD-10-CM | POA: Diagnosis not present

## 2016-04-30 DIAGNOSIS — R7309 Other abnormal glucose: Secondary | ICD-10-CM | POA: Diagnosis not present

## 2016-04-30 DIAGNOSIS — I2510 Atherosclerotic heart disease of native coronary artery without angina pectoris: Secondary | ICD-10-CM | POA: Diagnosis not present

## 2016-04-30 DIAGNOSIS — I129 Hypertensive chronic kidney disease with stage 1 through stage 4 chronic kidney disease, or unspecified chronic kidney disease: Secondary | ICD-10-CM | POA: Diagnosis not present

## 2016-05-01 DIAGNOSIS — M7052 Other bursitis of knee, left knee: Secondary | ICD-10-CM | POA: Diagnosis not present

## 2016-05-01 DIAGNOSIS — M1711 Unilateral primary osteoarthritis, right knee: Secondary | ICD-10-CM | POA: Diagnosis not present

## 2016-05-22 DIAGNOSIS — M25561 Pain in right knee: Secondary | ICD-10-CM | POA: Diagnosis not present

## 2016-05-28 DIAGNOSIS — M25561 Pain in right knee: Secondary | ICD-10-CM | POA: Diagnosis not present

## 2016-05-30 DIAGNOSIS — M25561 Pain in right knee: Secondary | ICD-10-CM | POA: Diagnosis not present

## 2016-06-04 DIAGNOSIS — M25561 Pain in right knee: Secondary | ICD-10-CM | POA: Diagnosis not present

## 2016-06-06 DIAGNOSIS — M25561 Pain in right knee: Secondary | ICD-10-CM | POA: Diagnosis not present

## 2016-06-10 ENCOUNTER — Other Ambulatory Visit: Payer: Self-pay

## 2016-06-11 DIAGNOSIS — M25561 Pain in right knee: Secondary | ICD-10-CM | POA: Diagnosis not present

## 2016-06-22 ENCOUNTER — Ambulatory Visit: Payer: Self-pay | Admitting: Orthopedic Surgery

## 2016-06-24 DIAGNOSIS — K219 Gastro-esophageal reflux disease without esophagitis: Secondary | ICD-10-CM | POA: Diagnosis not present

## 2016-06-24 DIAGNOSIS — R070 Pain in throat: Secondary | ICD-10-CM | POA: Diagnosis not present

## 2016-07-02 ENCOUNTER — Ambulatory Visit: Payer: Self-pay | Admitting: Orthopedic Surgery

## 2016-07-02 NOTE — H&P (Signed)
TOTAL KNEE ADMISSION H&P  Patient is being admitted for right total knee arthroplasty.  Subjective:  Chief Complaint:right knee pain.  HPI: Jeff Rivera, 75 y.o. male, has a history of pain and functional disability in the right knee due to arthritis and has failed non-surgical conservative treatments for greater than 12 weeks to includeNSAID's and/or analgesics, corticosteriod injections, flexibility and strengthening excercises, use of assistive devices, weight reduction as appropriate and activity modification.  Onset of symptoms was gradual, starting 4 years ago with gradually worsening course since that time. The patient noted no past surgery on the right knee(s).  Patient currently rates pain in the right knee(s) at 10 out of 10 with activity. Patient has night pain, worsening of pain with activity and weight bearing, pain that interferes with activities of daily living, pain with passive range of motion and crepitus.  Patient has evidence of subchondral cysts, subchondral sclerosis, periarticular osteophytes and joint space narrowing by imaging studies. There is no active infection.  Patient Active Problem List   Diagnosis Date Noted  . Arthritis of left knee 10/27/2014  . 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 CAD (coronary artery disease) 09/12/2013  . Aortic stenosis 09/12/2013  . Dyslipidemia 09/12/2013  . HTN (hypertension) 09/12/2013  . DOE (dyspnea on exertion) 09/12/2013   Past Medical History:  Diagnosis Date  . Aortic stenosis    mild   . Arthritis    OA  . CAD (coronary artery disease)   . Dyslipidemia   . ED (erectile dysfunction)   . GERD (gastroesophageal reflux disease)   . Hypertension     Past Surgical History:  Procedure Laterality Date  . CARDIAC CATHETERIZATION  01/2010   40% LAD lesion (Dr. Loni Muse. Little)   . EYE SURGERY Bilateral 2013   LENS REPLACMENT FOR CATRACTS  . TIE OFF FOR LOW SPERM COUNT  50 YRS AGO  . TOTAL KNEE ARTHROPLASTY Left  10/27/2014   Procedure: LEFT TOTAL KNEE ARTHROPLASTY;  Surgeon: Mcarthur Rossetti, MD;  Location: [facility];  Service: Orthopedics;  Laterality: Left;  . TRANSTHORACIC ECHOCARDIOGRAM  09/2011   EF=>55%, mild conc LVH; normal RV systolic function; LA mildly dilated, IVC borderline dilated with collapse >50%; mild mitral annular calcif & mild MR; trace TR, elevated RVSP; AV mildly sclerotic, mild calcif of AV leaflets, mild valvular AS, mild regurg; trace pulm valve regurg     (Not in a hospital admission) No Known Allergies  Social History  Substance Use Topics  . Smoking status: Former Smoker    Types: Cigarettes    Quit date: 09/05/1961  . Smokeless tobacco: Never Used  . Alcohol use 0.5 oz/week    1 Standard drinks or equivalent per week     Comment: 3-4 GLASSES WINE PER WEEK    Family History  Problem Relation Age of Onset  . Heart attack Father   . Coronary artery disease Father      Review of Systems  Constitutional: Negative.   HENT: Positive for tinnitus.   Eyes: Negative.   Respiratory: Negative.   Cardiovascular: Negative.   Gastrointestinal: Negative.   Genitourinary: Negative.   Musculoskeletal: Positive for joint pain.  Skin: Negative.   Neurological: Negative.   Endo/Heme/Allergies: Negative.   Psychiatric/Behavioral: Negative.     Objective:  Physical Exam  Vitals reviewed. Constitutional: He is oriented to person, place, and time. He appears well-developed and well-nourished.  HENT:  Head: Normocephalic and atraumatic.  Eyes: Conjunctivae and EOM are normal. Pupils are  equal, round, and reactive to light.  Neck: Normal range of motion. Neck supple.  Cardiovascular: Normal rate, regular rhythm and intact distal pulses.   Respiratory: Effort normal and breath sounds normal.  GI: Soft. Bowel sounds are normal. He exhibits no distension.  Genitourinary:  Genitourinary Comments: deferred  Musculoskeletal:       Right knee: He exhibits swelling and  effusion. Tenderness found. Medial joint line and lateral joint line tenderness noted.  Neurological: He is alert and oriented to person, place, and time. He has normal reflexes.  Skin: Skin is warm and dry.  Psychiatric: He has a normal mood and affect. His behavior is normal. Judgment and thought content normal.    Vital signs in last 24 hours: @VSRANGES @  Labs:   Estimated body mass index is 27.26 kg/m as calculated from the following:   Height as of 07/20/15: 5\' 8"  (1.727 m).   Weight as of 07/20/15: 81.3 kg (179 lb 4.8 oz).   Imaging Review Plain radiographs demonstrate severe degenerative joint disease of the right knee(s). The overall alignment issignificant varus. The bone quality appears to be adequate for age and reported activity level.  Assessment/Plan:  End stage arthritis, right knee   The patient history, physical examination, clinical judgment of the provider and imaging studies are consistent with end stage degenerative joint disease of the right knee(s) and total knee arthroplasty is deemed medically necessary. The treatment options including medical management, injection therapy arthroscopy and arthroplasty were discussed at length. The risks and benefits of total knee arthroplasty were presented and reviewed. The risks due to aseptic loosening, infection, stiffness, patella tracking problems, thromboembolic complications and other imponderables were discussed. The patient acknowledged the explanation, agreed to proceed with the plan and consent was signed. Patient is being admitted for inpatient treatment for surgery, pain control, PT, OT, prophylactic antibiotics, VTE prophylaxis, progressive ambulation and ADL's and discharge planning. The patient is planning to be discharged home with home health services. (+) TXA, ASA

## 2016-07-03 ENCOUNTER — Encounter (HOSPITAL_COMMUNITY)
Admission: RE | Admit: 2016-07-03 | Discharge: 2016-07-03 | Disposition: A | Discharge status: Home / Self Care | Payer: Medicare Other | Source: Ambulatory Visit | Attending: Orthopedic Surgery | Admitting: Orthopedic Surgery

## 2016-07-03 ENCOUNTER — Encounter (HOSPITAL_COMMUNITY): Payer: Self-pay

## 2016-07-03 DIAGNOSIS — I129 Hypertensive chronic kidney disease with stage 1 through stage 4 chronic kidney disease, or unspecified chronic kidney disease: Secondary | ICD-10-CM | POA: Insufficient documentation

## 2016-07-03 DIAGNOSIS — I2510 Atherosclerotic heart disease of native coronary artery without angina pectoris: Secondary | ICD-10-CM | POA: Insufficient documentation

## 2016-07-03 DIAGNOSIS — I350 Nonrheumatic aortic (valve) stenosis: Secondary | ICD-10-CM | POA: Diagnosis not present

## 2016-07-03 DIAGNOSIS — N189 Chronic kidney disease, unspecified: Secondary | ICD-10-CM | POA: Insufficient documentation

## 2016-07-03 DIAGNOSIS — M1990 Unspecified osteoarthritis, unspecified site: Secondary | ICD-10-CM | POA: Insufficient documentation

## 2016-07-03 DIAGNOSIS — E785 Hyperlipidemia, unspecified: Secondary | ICD-10-CM | POA: Insufficient documentation

## 2016-07-03 DIAGNOSIS — R0609 Other forms of dyspnea: Secondary | ICD-10-CM | POA: Insufficient documentation

## 2016-07-03 DIAGNOSIS — Z01812 Encounter for preprocedural laboratory examination: Secondary | ICD-10-CM | POA: Diagnosis present

## 2016-07-03 DIAGNOSIS — Z96652 Presence of left artificial knee joint: Secondary | ICD-10-CM | POA: Insufficient documentation

## 2016-07-03 HISTORY — DX: Adverse effect of unspecified anesthetic, initial encounter: T41.45XA

## 2016-07-03 HISTORY — DX: Personal history of urinary calculi: Z87.442

## 2016-07-03 HISTORY — DX: Cardiac murmur, unspecified: R01.1

## 2016-07-03 HISTORY — DX: Chronic kidney disease, unspecified: N18.9

## 2016-07-03 HISTORY — DX: Other complications of anesthesia, initial encounter: T88.59XA

## 2016-07-03 LAB — BASIC METABOLIC PANEL
Anion gap: 8 (ref 5–15)
BUN: 31 mg/dL — AB (ref 6–20)
CHLORIDE: 103 mmol/L (ref 101–111)
CO2: 26 mmol/L (ref 22–32)
CREATININE: 1.5 mg/dL — AB (ref 0.61–1.24)
Calcium: 10.7 mg/dL — ABNORMAL HIGH (ref 8.9–10.3)
GFR calc Af Amer: 50 mL/min — ABNORMAL LOW (ref >60)
GFR calc non Af Amer: 43 mL/min — ABNORMAL LOW (ref >60)
Glucose, Bld: 103 mg/dL — ABNORMAL HIGH (ref 65–99)
Potassium: 4.6 mmol/L (ref 3.5–5.1)
SODIUM: 137 mmol/L (ref 135–145)

## 2016-07-03 LAB — CBC
HCT: 42.4 % (ref 39.0–52.0)
Hemoglobin: 14.3 g/dL (ref 13.0–17.0)
MCH: 32.8 pg (ref 26.0–34.0)
MCHC: 33.7 g/dL (ref 30.0–36.0)
MCV: 97.2 fL (ref 78.0–100.0)
PLATELETS: 185 10*3/uL (ref 150–400)
RBC: 4.36 MIL/uL (ref 4.22–5.81)
RDW: 13.5 % (ref 11.5–15.5)
WBC: 7.1 10*3/uL (ref 4.0–10.5)

## 2016-07-03 LAB — TYPE AND SCREEN
ABO/RH(D): O POS
ANTIBODY SCREEN: NEGATIVE

## 2016-07-03 LAB — SURGICAL PCR SCREEN
MRSA, PCR: NEGATIVE
Staphylococcus aureus: NEGATIVE

## 2016-07-03 LAB — ABO/RH: ABO/RH(D): O POS

## 2016-07-03 NOTE — Progress Notes (Signed)
Jeff Rivera denies chest pain or shortness of breath.  Patient has ankle edema , is on HCTZ , but hasn't noticed any change in the edema.  Patients cardiologist is Dr Einar Gip, I have requested last office visit and any cardiac studies.

## 2016-07-03 NOTE — Pre-Procedure Instructions (Signed)
    Jeff Rivera  07/03/2016    Your procedure is scheduled on Monday, October 2.  Report to [facility] Admitting at 1:40 PM              Your surgery or procedure is scheduled for 3:40 PM   Call this number if you have problems the morning of surgery:[phone]               For any other questions, please call [phone], Monday - Friday 8 AM - 4 PM.    Remember:  Do not eat food or drink liquids after midnight.  Take these medicines the morning of surgery with A SIP OF WATER: omeprazole (PRILOSEC), alfuzosin (UROXATRAL).                1 Week prior to surgery STOP taking Aspirin , Aspirin Products (Goody Powder, Excedrin Migraine), Ibuprofen (Advil), Naproxen (Aleve),meloxicam (MOBIC)   Vitamins and Herbal Products (ie Fish Oil)   Do not wear jewelry, make-up or nail polish.  Do not wear lotions, powders, or perfumes, or deodorant.   Men may shave face and neck.  Do not bring valuables to the hospital.  [facility] is not responsible for any belongings or valuables.  Contacts, dentures or bridgework may not be worn into surgery.  Leave your suitcase in the car.  After surgery it may be brought to your room.  For patients admitted to the hospital, discharge time will be determined by your treatment team.  Special instructions: Review  [facility] - Preparing For Surgery.  Please read over the following fact sheets that you were given:  [facility]- Preparing For Surgery and Patient Instructions for Mupirocin Application, Incentive Spirometry

## 2016-07-04 NOTE — Progress Notes (Addendum)
Anesthesia Chart Review:  Pt is a 75 year old male scheduled for computer navigation assisted R total knee arthroplasty on 07/14/2016 with Dot Lanes, MD.   - PCP is Thressa Sheller, MD.  - Cardiologist is Kela Millin, MD, last office visit 03/07/16.   PMH includes:  CAD, HTN, aortic stenosis, CKD, GERD. Former smoker. BMI 27. S/p L TKA 10/27/14.   Medications include: ASA, lisinopril-hctz, prilosec, zocor  Preoperative labs reviewed.  Cr 1.5, BUN 31. Prior Cr results from PCP's office range 1.4-1.7 over last year.   EKG 02/07/16: NSR  Echo 02/20/16:  1. LV cavity is normal in size, mild concentric LVH. Normal global wall motion. Normal diastolic filling pattern. EF 64% 2. LA cavity mildly dilated at 4.2cm 3. Mild calcification aortic valve annulus. Mild AV leaflet calcification. Mildly restricted aortic peak pressure gradient of 29 and mean gradient of 18 mmHg, calculated AVA 1.27cm. Mild to moderate AR 4. Mild calcification of the mitral valve annulus. Mild MV leaflet calcification. Mild MR.  5. Mild TR. Mild pulmonary HTN. Pulmonary artery systolic pressure is estimated at 35 mmHg.   6. IVC is dilated with blunted respiratory response. This suggests elevated R heart pressure  Carotid duplex 02/20/16:  - R carotid bifurcation mild heterogeneous plaque - L common carotid soft plaque. L carotid bifurcation mild heterogeneous plaque. L ICA heterogeneous plaque, no stenosis  Nuclear stress test 02/11/16:  1. Myocardial perfusion imaging is normal. Overall LV systolic function is normal without regional wall motion abnormalities. LVEF 78%  If no changes, I anticipate pt can proceed with surgery as scheduled.   Willeen Cass, FNP-BC [facility]/Anesthesiology Phone: [phone] 07/04/2016 3:57 PM

## 2016-07-11 MED ORDER — TRANEXAMIC ACID 1000 MG/10ML IV SOLN
1000.0000 mg | INTRAVENOUS | Status: AC
  Administered 2016-07-14: 1000 mg via INTRAVENOUS
  Filled 2016-07-11: qty 10

## 2016-07-14 ENCOUNTER — Inpatient Hospital Stay (HOSPITAL_COMMUNITY): Payer: Medicare Other

## 2016-07-14 ENCOUNTER — Inpatient Hospital Stay (HOSPITAL_COMMUNITY): Payer: Medicare Other | Admitting: Emergency Medicine

## 2016-07-14 ENCOUNTER — Inpatient Hospital Stay (HOSPITAL_COMMUNITY)
Admission: RE | Admit: 2016-07-14 | Discharge: 2016-07-19 | DRG: 470 | Disposition: A | Discharge status: Skilled Nursing Facility | Payer: Medicare Other | Source: Ambulatory Visit | Attending: Orthopedic Surgery | Admitting: Orthopedic Surgery

## 2016-07-14 ENCOUNTER — Encounter (HOSPITAL_COMMUNITY): Payer: Self-pay | Admitting: *Deleted

## 2016-07-14 ENCOUNTER — Inpatient Hospital Stay (HOSPITAL_COMMUNITY): Payer: Medicare Other | Admitting: Anesthesiology

## 2016-07-14 ENCOUNTER — Encounter (HOSPITAL_COMMUNITY)
Admission: RE | Disposition: A | Discharge status: Skilled Nursing Facility | Payer: Self-pay | Source: Ambulatory Visit | Attending: Orthopedic Surgery

## 2016-07-14 DIAGNOSIS — Z96652 Presence of left artificial knee joint: Secondary | ICD-10-CM | POA: Diagnosis present

## 2016-07-14 DIAGNOSIS — Z471 Aftercare following joint replacement surgery: Secondary | ICD-10-CM | POA: Diagnosis not present

## 2016-07-14 DIAGNOSIS — I350 Nonrheumatic aortic (valve) stenosis: Secondary | ICD-10-CM | POA: Diagnosis present

## 2016-07-14 DIAGNOSIS — M1711 Unilateral primary osteoarthritis, right knee: Secondary | ICD-10-CM | POA: Diagnosis not present

## 2016-07-14 DIAGNOSIS — N189 Chronic kidney disease, unspecified: Secondary | ICD-10-CM | POA: Diagnosis present

## 2016-07-14 DIAGNOSIS — I10 Essential (primary) hypertension: Secondary | ICD-10-CM | POA: Diagnosis not present

## 2016-07-14 DIAGNOSIS — Z87891 Personal history of nicotine dependence: Secondary | ICD-10-CM | POA: Diagnosis not present

## 2016-07-14 DIAGNOSIS — K219 Gastro-esophageal reflux disease without esophagitis: Secondary | ICD-10-CM | POA: Diagnosis not present

## 2016-07-14 DIAGNOSIS — G8918 Other acute postprocedural pain: Secondary | ICD-10-CM | POA: Diagnosis not present

## 2016-07-14 DIAGNOSIS — I129 Hypertensive chronic kidney disease with stage 1 through stage 4 chronic kidney disease, or unspecified chronic kidney disease: Secondary | ICD-10-CM | POA: Diagnosis present

## 2016-07-14 DIAGNOSIS — I2510 Atherosclerotic heart disease of native coronary artery without angina pectoris: Secondary | ICD-10-CM | POA: Diagnosis present

## 2016-07-14 DIAGNOSIS — M25561 Pain in right knee: Secondary | ICD-10-CM | POA: Diagnosis not present

## 2016-07-14 DIAGNOSIS — M1990 Unspecified osteoarthritis, unspecified site: Secondary | ICD-10-CM | POA: Diagnosis not present

## 2016-07-14 DIAGNOSIS — M179 Osteoarthritis of knee, unspecified: Secondary | ICD-10-CM | POA: Diagnosis not present

## 2016-07-14 DIAGNOSIS — Z96651 Presence of right artificial knee joint: Secondary | ICD-10-CM | POA: Diagnosis not present

## 2016-07-14 DIAGNOSIS — E785 Hyperlipidemia, unspecified: Secondary | ICD-10-CM | POA: Diagnosis not present

## 2016-07-14 DIAGNOSIS — Z09 Encounter for follow-up examination after completed treatment for conditions other than malignant neoplasm: Secondary | ICD-10-CM

## 2016-07-14 DIAGNOSIS — M25569 Pain in unspecified knee: Secondary | ICD-10-CM | POA: Diagnosis not present

## 2016-07-14 HISTORY — PX: KNEE ARTHROPLASTY: SHX992

## 2016-07-14 SURGERY — ARTHROPLASTY, KNEE, TOTAL, USING IMAGELESS COMPUTER-ASSISTED NAVIGATION
Anesthesia: Spinal | Site: Knee | Laterality: Right

## 2016-07-14 MED ORDER — MIDAZOLAM HCL 2 MG/2ML IJ SOLN
INTRAMUSCULAR | Status: AC
  Administered 2016-07-14: 2 mg
  Filled 2016-07-14: qty 2

## 2016-07-14 MED ORDER — BUPIVACAINE-EPINEPHRINE (PF) 0.5% -1:200000 IJ SOLN
INTRAMUSCULAR | Status: AC
  Filled 2016-07-14: qty 30

## 2016-07-14 MED ORDER — PROPOFOL 500 MG/50ML IV EMUL
INTRAVENOUS | Status: DC | PRN
  Administered 2016-07-14: 75 ug/kg/min via INTRAVENOUS

## 2016-07-14 MED ORDER — FENTANYL CITRATE (PF) 100 MCG/2ML IJ SOLN
INTRAMUSCULAR | Status: AC
  Administered 2016-07-14: 50 ug via INTRAVENOUS
  Filled 2016-07-14: qty 2

## 2016-07-14 MED ORDER — SODIUM CHLORIDE 0.9 % IV SOLN
INTRAVENOUS | Status: DC | PRN
  Administered 2016-07-14: 50 mL

## 2016-07-14 MED ORDER — KETOROLAC TROMETHAMINE 30 MG/ML IJ SOLN
INTRAMUSCULAR | Status: DC | PRN
  Administered 2016-07-14: 30 mg via INTRA_ARTICULAR

## 2016-07-14 MED ORDER — LACTATED RINGERS IV SOLN
INTRAVENOUS | Status: DC
Start: 2016-07-14 — End: 2016-07-15
  Administered 2016-07-14: 50 mL/h via INTRAVENOUS
  Administered 2016-07-14: 20:00:00 via INTRAVENOUS

## 2016-07-14 MED ORDER — METHOCARBAMOL 500 MG PO TABS
ORAL_TABLET | ORAL | Status: AC
  Filled 2016-07-14: qty 1

## 2016-07-14 MED ORDER — PHENYLEPHRINE HCL 10 MG/ML IJ SOLN
INTRAMUSCULAR | Status: AC
  Filled 2016-07-14: qty 1

## 2016-07-14 MED ORDER — PHENYLEPHRINE HCL 10 MG/ML IJ SOLN
INTRAVENOUS | Status: DC | PRN
  Administered 2016-07-14: 75 ug/min via INTRAVENOUS
  Administered 2016-07-14: 50 ug/min via INTRAVENOUS

## 2016-07-14 MED ORDER — CEFAZOLIN SODIUM-DEXTROSE 2-4 GM/100ML-% IV SOLN
2.0000 g | INTRAVENOUS | Status: AC
  Administered 2016-07-14: 2 g via INTRAVENOUS

## 2016-07-14 MED ORDER — PROPOFOL 1000 MG/100ML IV EMUL
INTRAVENOUS | Status: AC
  Filled 2016-07-14: qty 200

## 2016-07-14 MED ORDER — METHOCARBAMOL 500 MG PO TABS
500.0000 mg | ORAL_TABLET | Freq: Four times a day (QID) | ORAL | Status: DC | PRN
  Administered 2016-07-14 – 2016-07-17 (×2): 500 mg via ORAL
  Filled 2016-07-14 (×2): qty 1

## 2016-07-14 MED ORDER — BUPIVACAINE-EPINEPHRINE (PF) 0.5% -1:200000 IJ SOLN
INTRAMUSCULAR | Status: DC | PRN
  Administered 2016-07-14: 30 mL

## 2016-07-14 MED ORDER — MEPERIDINE HCL 25 MG/ML IJ SOLN: 6.2500 mg | INTRAMUSCULAR | Status: DC | PRN

## 2016-07-14 MED ORDER — MIDAZOLAM HCL 2 MG/2ML IJ SOLN
INTRAMUSCULAR | Status: AC
  Filled 2016-07-14: qty 2

## 2016-07-14 MED ORDER — CEFAZOLIN SODIUM-DEXTROSE 2-4 GM/100ML-% IV SOLN
INTRAVENOUS | Status: AC
  Filled 2016-07-14: qty 100

## 2016-07-14 MED ORDER — SODIUM CHLORIDE 0.9 % IJ SOLN
INTRAMUSCULAR | Status: DC | PRN
  Administered 2016-07-14: 30 mL

## 2016-07-14 MED ORDER — FENTANYL CITRATE (PF) 100 MCG/2ML IJ SOLN
INTRAMUSCULAR | Status: AC
  Administered 2016-07-14: 50 ug
  Filled 2016-07-14: qty 2

## 2016-07-14 MED ORDER — 0.9 % SODIUM CHLORIDE (POUR BTL) OPTIME
TOPICAL | Status: DC | PRN
  Administered 2016-07-14: 1000 mL

## 2016-07-14 MED ORDER — FENTANYL CITRATE (PF) 100 MCG/2ML IJ SOLN
INTRAMUSCULAR | Status: AC
  Filled 2016-07-14: qty 2

## 2016-07-14 MED ORDER — ONDANSETRON HCL 4 MG/2ML IJ SOLN: 4.0000 mg | Freq: Once | INTRAMUSCULAR | Status: DC | PRN

## 2016-07-14 MED ORDER — CHLORHEXIDINE GLUCONATE 4 % EX LIQD: 60.0000 mL | Freq: Once | CUTANEOUS | Status: DC

## 2016-07-14 MED ORDER — BUPIVACAINE IN DEXTROSE 0.75-8.25 % IT SOLN
INTRATHECAL | Status: DC | PRN
  Administered 2016-07-14: 15 mg via INTRATHECAL

## 2016-07-14 MED ORDER — KETOROLAC TROMETHAMINE 15 MG/ML IJ SOLN
7.5000 mg | Freq: Four times a day (QID) | INTRAMUSCULAR | Status: AC
  Administered 2016-07-15 (×4): 7.5 mg via INTRAVENOUS
  Filled 2016-07-14 (×4): qty 1

## 2016-07-14 MED ORDER — HYDROCODONE-ACETAMINOPHEN 5-325 MG PO TABS
1.0000 | ORAL_TABLET | ORAL | Status: DC | PRN
  Administered 2016-07-14 – 2016-07-15 (×4): 2 via ORAL
  Filled 2016-07-14 (×4): qty 2

## 2016-07-14 MED ORDER — KETOROLAC TROMETHAMINE 30 MG/ML IJ SOLN
INTRAMUSCULAR | Status: AC
  Filled 2016-07-14: qty 1

## 2016-07-14 MED ORDER — SODIUM CHLORIDE 0.9 % IV SOLN: INTRAVENOUS | Status: DC

## 2016-07-14 MED ORDER — METHOCARBAMOL 1000 MG/10ML IJ SOLN
500.0000 mg | Freq: Four times a day (QID) | INTRAVENOUS | Status: DC | PRN
  Filled 2016-07-14: qty 5

## 2016-07-14 MED ORDER — HYDROCODONE-ACETAMINOPHEN 5-325 MG PO TABS
ORAL_TABLET | ORAL | Status: AC
  Filled 2016-07-14: qty 2

## 2016-07-14 MED ORDER — FENTANYL CITRATE (PF) 100 MCG/2ML IJ SOLN
25.0000 ug | INTRAMUSCULAR | Status: DC | PRN
  Administered 2016-07-14 (×3): 50 ug via INTRAVENOUS

## 2016-07-14 MED ORDER — POVIDONE-IODINE 10 % EX SWAB: 2.0000 "application " | Freq: Once | CUTANEOUS | Status: DC

## 2016-07-14 MED ORDER — SODIUM CHLORIDE 0.9 % IR SOLN
Status: DC | PRN
  Administered 2016-07-14: 3000 mL

## 2016-07-14 SURGICAL SUPPLY — 61 items
ADH SKN CLS APL DERMABOND .7 (GAUZE/BANDAGES/DRESSINGS) ×2
ALCOHOL ISOPROPYL (RUBBING) (MISCELLANEOUS) ×3 IMPLANT
BANDAGE ACE 6X5 VEL STRL LF (GAUZE/BANDAGES/DRESSINGS) ×3 IMPLANT
BANDAGE ELASTIC 6 VELCRO ST LF (GAUZE/BANDAGES/DRESSINGS) ×3 IMPLANT
BLADE SAW RECIP 87.9 MT (BLADE) ×3 IMPLANT
BONE CEMENT SIMPLEX TOBRAMYCIN (Cement) ×9 IMPLANT
CAPT KNEE TOTAL 3 ×2 IMPLANT
CEMENT BONE SIMPLEX TOBRAMYCIN (Cement) IMPLANT
CHLORAPREP W/TINT 26ML (MISCELLANEOUS) ×6 IMPLANT
COVER SURGICAL LIGHT HANDLE (MISCELLANEOUS) ×3 IMPLANT
CUFF TOURNIQUET SINGLE 34IN LL (TOURNIQUET CUFF) ×3 IMPLANT
DERMABOND ADVANCED (GAUZE/BANDAGES/DRESSINGS) ×4
DERMABOND ADVANCED .7 DNX12 (GAUZE/BANDAGES/DRESSINGS) ×1 IMPLANT
DRAIN HEMOVAC 7FR (DRAIN) ×3 IMPLANT
DRAPE EXTREMITY T 121X128X90 (DRAPE) ×3 IMPLANT
DRAPE SHEET LG 3/4 BI-LAMINATE (DRAPES) ×6 IMPLANT
DRAPE U-SHAPE 47X51 STRL (DRAPES) ×3 IMPLANT
DRAPE UNIVERSAL PACK (DRAPES) ×3 IMPLANT
DRSG AQUACEL AG ADV 3.5X14 (GAUZE/BANDAGES/DRESSINGS) ×3 IMPLANT
DRSG MEPILEX BORDER 4X4 (GAUZE/BANDAGES/DRESSINGS) ×3 IMPLANT
DRSG TEGADERM 2-3/8X2-3/4 SM (GAUZE/BANDAGES/DRESSINGS) ×1 IMPLANT
ELECT BLADE 4.0 EZ CLEAN MEGAD (MISCELLANEOUS) ×3
ELECT REM PT RETURN 9FT ADLT (ELECTROSURGICAL) ×3
ELECTRODE BLDE 4.0 EZ CLN MEGD (MISCELLANEOUS) IMPLANT
ELECTRODE REM PT RTRN 9FT ADLT (ELECTROSURGICAL) ×1 IMPLANT
EVACUATOR 1/8 PVC DRAIN (DRAIN) ×2 IMPLANT
GLOVE BIO SURGEON STRL SZ8.5 (GLOVE) ×6 IMPLANT
GLOVE BIOGEL PI IND STRL 6.5 (GLOVE) IMPLANT
GLOVE BIOGEL PI IND STRL 7.0 (GLOVE) ×1 IMPLANT
GLOVE BIOGEL PI IND STRL 8.5 (GLOVE) ×1 IMPLANT
GLOVE BIOGEL PI INDICATOR 6.5 (GLOVE) ×2
GLOVE BIOGEL PI INDICATOR 7.0 (GLOVE) ×2
GLOVE BIOGEL PI INDICATOR 8.5 (GLOVE) ×2
GOWN STRL REUS W/TWL 2XL LVL3 (GOWN DISPOSABLE) ×3 IMPLANT
HANDPIECE INTERPULSE COAX TIP (DISPOSABLE) ×3
KIT BASIN OR (CUSTOM PROCEDURE TRAY) ×3 IMPLANT
MANIFOLD NEPTUNE II (INSTRUMENTS) ×3 IMPLANT
NAVIGATION CASE RENTAL (PORTABLE EQUIPMENT SUPPLIES) ×3 IMPLANT
NDL SPNL 18GX3.5 QUINCKE PK (NEEDLE) ×2 IMPLANT
NEEDLE SPNL 18GX3.5 QUINCKE PK (NEEDLE) ×3 IMPLANT
PACK TOTAL JOINT (CUSTOM PROCEDURE TRAY) ×3 IMPLANT
PACK TOTAL KNEE CUSTOM (KITS) ×3 IMPLANT
PADDING CAST COTTON 6X4 STRL (CAST SUPPLIES) ×1 IMPLANT
PEN SKIN MARKING BROAD (MISCELLANEOUS) ×2 IMPLANT
SAW OSC TIP CART 19.5X105X1.3 (SAW) ×3 IMPLANT
SEALER BIPOLAR AQUA 6.0 (INSTRUMENTS) ×3 IMPLANT
SET HNDPC FAN SPRY TIP SCT (DISPOSABLE) ×1 IMPLANT
SET PAD KNEE POSITIONER (MISCELLANEOUS) ×3 IMPLANT
SPONGE INTESTINAL PEANUT (DISPOSABLE) ×2 IMPLANT
SPONGE LAP 18X18 X RAY DECT (DISPOSABLE) ×3 IMPLANT
SUT MNCRL AB 3-0 PS2 18 (SUTURE) ×3 IMPLANT
SUT MNCRL AB 3-0 PS2 27 (SUTURE) ×3 IMPLANT
SUT MON AB 2-0 CT1 36 (SUTURE) ×6 IMPLANT
SUT VIC AB 1 CTX 27 (SUTURE) ×6 IMPLANT
SUT VIC AB 2-0 CT1 27 (SUTURE) ×3
SUT VIC AB 2-0 CT1 TAPERPNT 27 (SUTURE) ×1 IMPLANT
SUT VLOC 180 0 24IN GS25 (SUTURE) ×3 IMPLANT
SYR 50ML LL SCALE MARK (SYRINGE) ×4 IMPLANT
TOWER CARTRIDGE SMART MIX (DISPOSABLE) ×5 IMPLANT
TRAY FOLEY CATH SILVER 16FR (SET/KITS/TRAYS/PACK) ×2 IMPLANT
WRAP KNEE MAXI GEL POST OP (GAUZE/BANDAGES/DRESSINGS) ×5 IMPLANT

## 2016-07-14 NOTE — Anesthesia Procedure Notes (Signed)
Procedure Name: MAC Date/Time: 07/14/2016 7:10 PM Performed by: Babs Bertin Pre-anesthesia Checklist: Patient identified, Emergency Drugs available, Suction available, Patient being monitored and Timeout performed Patient Re-evaluated:Patient Re-evaluated prior to inductionOxygen Delivery Method: Nasal cannula

## 2016-07-14 NOTE — Discharge Instructions (Signed)
° °Dr. Caspian Deleonardis °Total Joint Specialist °[facility] °[address] °[place], [place] [address] °[phone] ° °TOTAL KNEE REPLACEMENT POSTOPERATIVE DIRECTIONS ° ° ° °Knee Rehabilitation, Guidelines Following Surgery  °Results after knee surgery are often greatly improved when you follow the exercise, range of motion and muscle strengthening exercises prescribed by your doctor. Safety measures are also important to protect the knee from further injury. Any time any of these exercises cause you to have increased pain or swelling in your knee joint, decrease the amount until you are comfortable again and slowly increase them. If you have problems or questions, call your caregiver or physical therapist for advice.  ° °WEIGHT BEARING °Weight bearing as tolerated with assist device (walker, cane, etc) as directed, use it as long as suggested by your surgeon or therapist, typically at least 4-6 weeks. ° °HOME CARE INSTRUCTIONS  °Remove items at home which could result in a fall. This includes throw rugs or furniture in walking pathways.  °Continue medications as instructed at time of discharge. °You may have some home medications which will be placed on hold until you complete the course of blood thinner medication.  °You may start showering once you are discharged home but do not submerge the incision under water. Just pat the incision dry and apply a dry gauze dressing on daily. °Walk with walker as instructed.  °You may resume a sexual relationship in one month or when given the OK by your doctor.  °· Use walker as long as suggested by your caregivers. °· Avoid periods of inactivity such as sitting longer than an hour when not asleep. This helps prevent blood clots.  °You may put full weight on your legs and walk as much as is comfortable.  °You may return to work once you are cleared by your doctor.  °Do not drive a car for 6 weeks or until released by you surgeon.  °· Do not drive  while taking narcotics.  °Wear the elastic stockings for three weeks following surgery during the day but you may remove then at night. °Make sure you keep all of your appointments after your operation with all of your doctors and caregivers. You should call the office at the above phone number and make an appointment for approximately two weeks after the date of your surgery. °Do not remove your surgical dressing. The dressing is waterproof; you may take showers in 3 days, but do not take tub baths or submerge the dressing. °Please pick up a stool softener and laxative for home use as long as you are requiring pain medications. °· ICE to the affected knee every three hours for 30 minutes at a time and then as needed for pain and swelling.  Continue to use ice on the knee for pain and swelling from surgery. You may notice swelling that will progress down to the foot and ankle.  This is normal after surgery.  Elevate the leg when you are not up walking on it.   °It is important for you to complete the blood thinner medication as prescribed by your doctor. °· Continue to use the breathing machine which will help keep your temperature down.  It is common for your temperature to cycle up and down following surgery, especially at night when you are not up moving around and exerting yourself.  The breathing machine keeps your lungs expanded and your temperature down. ° °RANGE OF MOTION AND STRENGTHENING EXERCISES  °Rehabilitation of the knee is important following   a knee injury or an operation. After just a few days of immobilization, the muscles of the thigh which control the knee become weakened and shrink (atrophy). Knee exercises are designed to build up the tone and strength of the thigh muscles and to improve knee motion. Often times heat used for twenty to thirty minutes before working out will loosen up your tissues and help with improving the range of motion but do not use heat for the first two weeks following  surgery. These exercises can be done on a training (exercise) mat, on the floor, on a table or on a bed. Use what ever works the best and is most comfortable for you Knee exercises include:  °Leg Lifts - While your knee is still immobilized in a splint or cast, you can do straight leg raises. Lift the leg to 60 degrees, hold for 3 sec, and slowly lower the leg. Repeat 10-20 times 2-3 times daily. Perform this exercise against resistance later as your knee gets better.  °Quad and Hamstring Sets - Tighten up the muscle on the front of the thigh (Quad) and hold for 5-10 sec. Repeat this 10-20 times hourly. Hamstring sets are done by pushing the foot backward against an object and holding for 5-10 sec. Repeat as with quad sets.  °A rehabilitation program following serious knee injuries can speed recovery and prevent re-injury in the future due to weakened muscles. Contact your doctor or a physical therapist for more information on knee rehabilitation.  ° °SKILLED REHAB INSTRUCTIONS: °If the patient is transferred to a skilled rehab facility following release from the hospital, a list of the current medications will be sent to the facility for the patient to continue.  When discharged from the skilled rehab facility, please have the facility set up the patient's Home Health Physical Therapy prior to being released. Also, the skilled facility will be responsible for providing the patient with their medications at time of release from the facility to include their pain medication, the muscle relaxants, and their blood thinner medication. If the patient is still at the rehab facility at time of the two week follow up appointment, the skilled rehab facility will also need to assist the patient in arranging follow up appointment in our office and any transportation needs. ° °MAKE SURE YOU:  °Understand these instructions.  °Will watch your condition.  °Will get help right away if you are not doing well or get worse.   ° ° °Pick up stool softner and laxative for home use following surgery while on pain medications. °Do NOT remove your dressing. You may shower.  °Do not take tub baths or submerge incision under water. °May shower starting three days after surgery. °Please use a clean towel to pat the incision dry following showers. °Continue to use ice for pain and swelling after surgery. °Do not use any lotions or creams on the incision until instructed by your surgeon. ° °

## 2016-07-14 NOTE — Interval H&P Note (Signed)
History and Physical Interval Note:  07/14/2016 5:16 PM  Jeff Rivera  has presented today for surgery, with the diagnosis of DEGENERATIVE JOINT RIGHT KNEE  The various methods of treatment have been discussed with the patient and family. After consideration of risks, benefits and other options for treatment, the patient has consented to  Procedure(s): COMPUTER NAVIGATION ASSISTED TOTAL KNEE ARTHROPLASTY RIGHT (Right) as a surgical intervention .  The patient's history has been reviewed, patient examined, no change in status, stable for surgery.  I have reviewed the patient's chart and labs.  Questions were answered to the patient's satisfaction.     Kirstein Baxley, Horald Pollen

## 2016-07-14 NOTE — Anesthesia Postprocedure Evaluation (Signed)
Anesthesia Post Note  Patient: DREWEY HOLEN  Procedure(s) Performed: Procedure(s) (LRB): COMPUTER NAVIGATION ASSISTED TOTAL KNEE ARTHROPLASTY RIGHT (Right)  Patient location during evaluation: PACU Anesthesia Type: Spinal and MAC Level of consciousness: awake and alert Pain management: pain level controlled Vital Signs Assessment: post-procedure vital signs reviewed and stable Respiratory status: spontaneous breathing and respiratory function stable Cardiovascular status: blood pressure returned to baseline and stable Postop Assessment: spinal receding Anesthetic complications: no    Last Vitals:  Vitals:   07/14/16 2255 07/14/16 2310  BP: (!) 118/59 122/75  Pulse: 76 84  Resp: 10 17  Temp:      Last Pain:  Vitals:   07/14/16 2319  TempSrc:   PainSc: 10-Worst pain ever                 Dyland Panuco DANIEL

## 2016-07-14 NOTE — Op Note (Signed)
OPERATIVE REPORT  SURGEON: Rod Can, MD   ASSISTANT: Ky Barban, RNFA  PREOPERATIVE DIAGNOSIS: Right knee arthritis.   POSTOPERATIVE DIAGNOSIS: Right knee arthritis.   PROCEDURE: Right total knee arthroplasty.   IMPLANTS: Stryker Triathlon CR femur, size 6. Stryker universal tibia, size 6. X3 polyethelyene insert, size 9 mm, CS. 3 button asymmetric patella, size 35 mm. Simplex P bone cement.  ANESTHESIA:  Spinal  TOURNIQUET TIME: 26 min at 35mm Hg.  ESTIMATED BLOOD LOSS: 400  ML.  ANTIBIOTICS: 2 g Ancef.  DRAINS: Medium HV x1.  COMPLICATIONS: None   CONDITION: PACU - hemodynamically stable.   BRIEF CLINICAL NOTE: Jeff Rivera is a 75 y.o. male with a long-standing history of Right knee arthritis. After failing conservative management, the patient was indicated for total knee arthroplasty. The risks, benefits, and alternatives to the procedure were explained, and the patient elected to proceed.  PROCEDURE IN DETAIL: Spinal anesthesia was obtained in the pre-op holding area. Once inside the operative room, a foley catheter was inserted. The patient was then positioned, a nonsterile tourniquet was placed, and the lower extremity was prepped and draped in the normal sterile surgical fashion. A time-out was called verifying side and site of surgery. The patient received IV antibiotics within 60 minutes of beginning the procedure.   An anterior approach to the knee was performed utilizing a midvastus arthrotomy. A medial release was performed and the patellar fat pad was excised. Stryker navigation was used to cut the distal femur perpendicular to the mechanical axis. A freehand patellar resection was performed, and the patella was sized an prepared with 3 lug holes.  Nagivation was used to make a neutral proximal tibia resection, taking 8 mm of bone from the less affected  lateral side with 3 degrees of slope. The menisci were excised. A spacer block was placed, and the alignment and balance in extension were confirmed.   The distal femur was sized using the 3-degree external rotation guide referencing the posterior femoral cortex. The appropriate 4-in-1 cutting block was pinned into place. Rotation was checked using Whiteside's line, the epicondylar axis, and then confirmed with a spacer block in flexion. The remaining femoral cuts were performed, taking care to protect the MCL.  The tibia was sized and the trial tray was pinned into place. The remaining trail components were inserted. The knee was stable to varus and valgus stress through a full range of motion. The patella tracked centrally, and the PCL was well balanced. The trial components were removed, and the proximal tibial surface was prepared. The extremity was exsanguinated with an Esmarch, and the tourniquet was inflated. Small drill holes were made in the sclerotic subchondral bone.The cut bony surfaces were irrigated with pulse lavage. Final components were cemented into place and excess cement was cleared. The trial insert was placed, and the knee was brought into extension while the cement polymerized. Once the cement was hard, the knee was tested for a final time and found to be well balanced. The trial insert was exchanged for the real polyethylene insert.  The wound was copiously irrigated with a dilute betadine solution followed by normal saline with pulse lavage. Marcaine solution was injected into the periarticular soft tissue. The wound was closed in layers using #1 Vicryl and V-Loc for the fascia, 2-0 Vicryl for the subcutaneous fat, 2-0 Monocryl for the deep dermal layer, 3-0 running Monocryl subcuticular Stitch, and Dermabond for the skin. Once the glue was fully dried, an Aquacell Ag and compressive dressing  were applied. The tourniquet was let down, and the patient was transported to the  recovery room in stable ondition. Sponge, needle, and instrument counts were correct at the end of the case x2. The patient tolerated the procedure well and there were no known complications.

## 2016-07-14 NOTE — H&P (View-Only) (Signed)
TOTAL KNEE ADMISSION H&P  Patient is being admitted for right total knee arthroplasty.  Subjective:  Chief Complaint:right knee pain.  HPI: Jeff Rivera, 75 y.o. male, has a history of pain and functional disability in the right knee due to arthritis and has failed non-surgical conservative treatments for greater than 12 weeks to includeNSAID's and/or analgesics, corticosteriod injections, flexibility and strengthening excercises, use of assistive devices, weight reduction as appropriate and activity modification.  Onset of symptoms was gradual, starting 4 years ago with gradually worsening course since that time. The patient noted no past surgery on the right knee(s).  Patient currently rates pain in the right knee(s) at 10 out of 10 with activity. Patient has night pain, worsening of pain with activity and weight bearing, pain that interferes with activities of daily living, pain with passive range of motion and crepitus.  Patient has evidence of subchondral cysts, subchondral sclerosis, periarticular osteophytes and joint space narrowing by imaging studies. There is no active infection.  Patient Active Problem List   Diagnosis Date Noted  . Arthritis of left knee 10/27/2014  . 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 CAD (coronary artery disease) 09/12/2013  . Aortic stenosis 09/12/2013  . Dyslipidemia 09/12/2013  . HTN (hypertension) 09/12/2013  . DOE (dyspnea on exertion) 09/12/2013   Past Medical History:  Diagnosis Date  . Aortic stenosis    mild   . Arthritis    OA  . CAD (coronary artery disease)   . Dyslipidemia   . ED (erectile dysfunction)   . GERD (gastroesophageal reflux disease)   . Hypertension     Past Surgical History:  Procedure Laterality Date  . CARDIAC CATHETERIZATION  01/2010   40% LAD lesion (Dr. Loni Muse. Little)   . EYE SURGERY Bilateral 2013   LENS REPLACMENT FOR CATRACTS  . TIE OFF FOR LOW SPERM COUNT  50 YRS AGO  . TOTAL KNEE ARTHROPLASTY Left  10/27/2014   Procedure: LEFT TOTAL KNEE ARTHROPLASTY;  Surgeon: Mcarthur Rossetti, MD;  Location: [facility];  Service: Orthopedics;  Laterality: Left;  . TRANSTHORACIC ECHOCARDIOGRAM  09/2011   EF=>55%, mild conc LVH; normal RV systolic function; LA mildly dilated, IVC borderline dilated with collapse >50%; mild mitral annular calcif & mild MR; trace TR, elevated RVSP; AV mildly sclerotic, mild calcif of AV leaflets, mild valvular AS, mild regurg; trace pulm valve regurg     (Not in a hospital admission) No Known Allergies  Social History  Substance Use Topics  . Smoking status: Former Smoker    Types: Cigarettes    Quit date: 09/05/1961  . Smokeless tobacco: Never Used  . Alcohol use 0.5 oz/week    1 Standard drinks or equivalent per week     Comment: 3-4 GLASSES WINE PER WEEK    Family History  Problem Relation Age of Onset  . Heart attack Father   . Coronary artery disease Father      Review of Systems  Constitutional: Negative.   HENT: Positive for tinnitus.   Eyes: Negative.   Respiratory: Negative.   Cardiovascular: Negative.   Gastrointestinal: Negative.   Genitourinary: Negative.   Musculoskeletal: Positive for joint pain.  Skin: Negative.   Neurological: Negative.   Endo/Heme/Allergies: Negative.   Psychiatric/Behavioral: Negative.     Objective:  Physical Exam  Vitals reviewed. Constitutional: He is oriented to person, place, and time. He appears well-developed and well-nourished.  HENT:  Head: Normocephalic and atraumatic.  Eyes: Conjunctivae and EOM are normal. Pupils are  equal, round, and reactive to light.  Neck: Normal range of motion. Neck supple.  Cardiovascular: Normal rate, regular rhythm and intact distal pulses.   Respiratory: Effort normal and breath sounds normal.  GI: Soft. Bowel sounds are normal. He exhibits no distension.  Genitourinary:  Genitourinary Comments: deferred  Musculoskeletal:       Right knee: He exhibits swelling and  effusion. Tenderness found. Medial joint line and lateral joint line tenderness noted.  Neurological: He is alert and oriented to person, place, and time. He has normal reflexes.  Skin: Skin is warm and dry.  Psychiatric: He has a normal mood and affect. His behavior is normal. Judgment and thought content normal.    Vital signs in last 24 hours: @VSRANGES @  Labs:   Estimated body mass index is 27.26 kg/m as calculated from the following:   Height as of 07/20/15: 5\' 8"  (1.727 m).   Weight as of 07/20/15: 81.3 kg (179 lb 4.8 oz).   Imaging Review Plain radiographs demonstrate severe degenerative joint disease of the right knee(s). The overall alignment issignificant varus. The bone quality appears to be adequate for age and reported activity level.  Assessment/Plan:  End stage arthritis, right knee   The patient history, physical examination, clinical judgment of the provider and imaging studies are consistent with end stage degenerative joint disease of the right knee(s) and total knee arthroplasty is deemed medically necessary. The treatment options including medical management, injection therapy arthroscopy and arthroplasty were discussed at length. The risks and benefits of total knee arthroplasty were presented and reviewed. The risks due to aseptic loosening, infection, stiffness, patella tracking problems, thromboembolic complications and other imponderables were discussed. The patient acknowledged the explanation, agreed to proceed with the plan and consent was signed. Patient is being admitted for inpatient treatment for surgery, pain control, PT, OT, prophylactic antibiotics, VTE prophylaxis, progressive ambulation and ADL's and discharge planning. The patient is planning to be discharged home with home health services. (+) TXA, ASA

## 2016-07-14 NOTE — Anesthesia Preprocedure Evaluation (Signed)
Anesthesia Evaluation  Patient identified by MRN, date of birth, ID band Patient awake    Reviewed: Allergy & Precautions, H&P , Patient's Chart, lab work & pertinent test results  Airway Mallampati: II  TM Distance: >3 FB Neck ROM: full    Dental no notable dental hx.    Pulmonary former smoker,    Pulmonary exam normal breath sounds clear to auscultation       Cardiovascular Exercise Tolerance: Good hypertension,  Rhythm:regular Rate:Normal     Neuro/Psych    GI/Hepatic   Endo/Other    Renal/GU      Musculoskeletal   Abdominal   Peds  Hematology   Anesthesia Other Findings Post-op delirium last hospitalization. Son requests we avoid morphine/codeine based drugs No CAD Sx  Reproductive/Obstetrics                             Anesthesia Physical Anesthesia Plan  ASA: II  Anesthesia Plan: Spinal   Post-op Pain Management: GA combined w/ Regional for post-op pain   Induction:   Airway Management Planned:   Additional Equipment:   Intra-op Plan:   Post-operative Plan:   Informed Consent: I have reviewed the patients History and Physical, chart, labs and discussed the procedure including the risks, benefits and alternatives for the proposed anesthesia with the patient or authorized representative who has indicated his/her understanding and acceptance.   Dental Advisory Given  Plan Discussed with: CRNA  Anesthesia Plan Comments: (Lab work confirmed with CRNA in room. Platelets okay. Discussed spinal anesthetic, and patient consents to the procedure:  included risk of possible headache,backache, failed block, allergic reaction, and nerve injury. This patient was asked if she had any questions or concerns before the procedure started. )        Anesthesia Quick Evaluation

## 2016-07-14 NOTE — Transfer of Care (Signed)
Immediate Anesthesia Transfer of Care Note  Patient: Jeff Rivera  Procedure(s) Performed: Procedure(s): COMPUTER NAVIGATION ASSISTED TOTAL KNEE ARTHROPLASTY RIGHT (Right)  Patient Location: PACU  Anesthesia Type:MAC and Spinal  Level of Consciousness: awake, alert  and patient cooperative  Airway & Oxygen Therapy: Patient Spontanous Breathing  Post-op Assessment: Report given to RN and Post -op Vital signs reviewed and stable  Post vital signs: Reviewed and stable  Last Vitals:  Vitals:   07/14/16 1625 07/14/16 1630  BP:  130/72  Pulse: 84 84  Resp: (!) 27 15  Temp:      Last Pain:  Vitals:   07/14/16 1418  TempSrc:   PainSc: 0-No pain      Patients Stated Pain Goal: 2 ([id number])  Complications: No apparent anesthesia complications

## 2016-07-14 NOTE — Anesthesia Procedure Notes (Signed)
Anesthesia Regional Block:  Adductor canal block  Pre-Anesthetic Checklist: ,, timeout performed, Correct Patient, Correct Site, Correct Laterality, Correct Procedure, Correct Position,,,, pre-op evaluation, at surgeon's request and post-op pain management  Laterality: Right  Prep: chloraprep       Needles:   Needle Type: Echogenic Needle          Additional Needles:  Procedures: ultrasound guided (picture in chart) Adductor canal block Narrative:  Injection made incrementally with aspirations every 5 mL.  Performed by: Personally   Additional Notes: .75% Naropin 20cc

## 2016-07-14 NOTE — Anesthesia Procedure Notes (Signed)
Spinal  Patient location during procedure: OR Start time: 07/14/2016 7:01 PM End time: 07/14/2016 7:12 PM Staffing Anesthesiologist: Duane Boston Performed: anesthesiologist  Preanesthetic Checklist Completed: patient identified, surgical consent, pre-op evaluation, timeout performed, IV checked, risks and benefits discussed and monitors and equipment checked Spinal Block Patient position: sitting Prep: DuraPrep Patient monitoring: cardiac monitor, continuous pulse ox and blood pressure Approach: midline Location: L2-3 Injection technique: single-shot Needle Needle type: Pencan  Needle gauge: 24 G Needle length: 9 cm Additional Notes Functioning IV was confirmed and monitors were applied. Sterile prep and drape, including hand hygiene and sterile gloves were used. The patient was positioned and the spine was prepped. The skin was anesthetized with lidocaine.  Free flow of clear CSF was obtained prior to injecting local anesthetic into the CSF.  The spinal needle aspirated freely following injection.  The needle was carefully withdrawn.  The patient tolerated the procedure well.

## 2016-07-15 ENCOUNTER — Encounter (HOSPITAL_COMMUNITY): Payer: Self-pay | Admitting: Orthopedic Surgery

## 2016-07-15 HISTORY — PX: TOTAL KNEE ARTHROPLASTY: SHX125

## 2016-07-15 LAB — BASIC METABOLIC PANEL
Anion gap: 3 — ABNORMAL LOW (ref 5–15)
BUN: 20 mg/dL (ref 6–20)
CO2: 28 mmol/L (ref 22–32)
CREATININE: 1.53 mg/dL — AB (ref 0.61–1.24)
Calcium: 8.8 mg/dL — ABNORMAL LOW (ref 8.9–10.3)
Chloride: 104 mmol/L (ref 101–111)
GFR calc Af Amer: 49 mL/min — ABNORMAL LOW (ref >60)
GFR calc non Af Amer: 42 mL/min — ABNORMAL LOW (ref >60)
Glucose, Bld: 97 mg/dL (ref 65–99)
POTASSIUM: 4.1 mmol/L (ref 3.5–5.1)
SODIUM: 135 mmol/L (ref 135–145)

## 2016-07-15 LAB — CBC
HCT: 30.3 % — ABNORMAL LOW (ref 39.0–52.0)
Hemoglobin: 10.2 g/dL — ABNORMAL LOW (ref 13.0–17.0)
MCH: 32.5 pg (ref 26.0–34.0)
MCHC: 33.7 g/dL (ref 30.0–36.0)
MCV: 96.5 fL (ref 78.0–100.0)
PLATELETS: 164 10*3/uL (ref 150–400)
RBC: 3.14 MIL/uL — AB (ref 4.22–5.81)
RDW: 13.9 % (ref 11.5–15.5)
WBC: 7.3 10*3/uL (ref 4.0–10.5)

## 2016-07-15 MED ORDER — LISINOPRIL 10 MG PO TABS
10.0000 mg | ORAL_TABLET | Freq: Every day | ORAL | Status: DC
  Administered 2016-07-16 – 2016-07-19 (×4): 10 mg via ORAL
  Filled 2016-07-15 (×5): qty 1

## 2016-07-15 MED ORDER — PANTOPRAZOLE SODIUM 40 MG PO TBEC
40.0000 mg | DELAYED_RELEASE_TABLET | Freq: Every day | ORAL | Status: DC
  Administered 2016-07-15 – 2016-07-19 (×5): 40 mg via ORAL
  Filled 2016-07-15 (×5): qty 1

## 2016-07-15 MED ORDER — DIPHENHYDRAMINE HCL 12.5 MG/5ML PO ELIX: 12.5000 mg | ORAL_SOLUTION | ORAL | Status: DC | PRN

## 2016-07-15 MED ORDER — ACETAMINOPHEN 650 MG RE SUPP: 650.0000 mg | Freq: Four times a day (QID) | RECTAL | Status: DC | PRN

## 2016-07-15 MED ORDER — TRANEXAMIC ACID 1000 MG/10ML IV SOLN
1000.0000 mg | Freq: Once | INTRAVENOUS | Status: AC
  Administered 2016-07-15: 1000 mg via INTRAVENOUS
  Filled 2016-07-15: qty 10

## 2016-07-15 MED ORDER — MENTHOL 3 MG MT LOZG: 1.0000 | LOZENGE | OROMUCOSAL | Status: DC | PRN

## 2016-07-15 MED ORDER — VITAMIN D 1000 UNITS PO TABS
2000.0000 [IU] | ORAL_TABLET | Freq: Every day | ORAL | Status: DC
  Administered 2016-07-15 – 2016-07-19 (×5): 2000 [IU] via ORAL
  Filled 2016-07-15 (×5): qty 2

## 2016-07-15 MED ORDER — SIMVASTATIN 20 MG PO TABS
20.0000 mg | ORAL_TABLET | Freq: Every evening | ORAL | Status: DC
  Administered 2016-07-15 – 2016-07-18 (×4): 20 mg via ORAL
  Filled 2016-07-15 (×4): qty 1

## 2016-07-15 MED ORDER — METOCLOPRAMIDE HCL 5 MG/ML IJ SOLN: 5.0000 mg | Freq: Three times a day (TID) | INTRAMUSCULAR | Status: DC | PRN

## 2016-07-15 MED ORDER — ASPIRIN 81 MG PO CHEW: 81.0000 mg | CHEWABLE_TABLET | Freq: Two times a day (BID) | ORAL | 1 refills | Status: DC

## 2016-07-15 MED ORDER — ONDANSETRON HCL 4 MG/2ML IJ SOLN: 4.0000 mg | Freq: Four times a day (QID) | INTRAMUSCULAR | Status: DC | PRN

## 2016-07-15 MED ORDER — SENNA 8.6 MG PO TABS
2.0000 | ORAL_TABLET | Freq: Every day | ORAL | Status: DC
  Administered 2016-07-15 – 2016-07-18 (×4): 17.2 mg via ORAL
  Filled 2016-07-15 (×4): qty 2

## 2016-07-15 MED ORDER — SODIUM CHLORIDE 0.9 % IV SOLN
INTRAVENOUS | Status: DC
  Administered 2016-07-15: 10:00:00 via INTRAVENOUS

## 2016-07-15 MED ORDER — ASPIRIN 81 MG PO CHEW
81.0000 mg | CHEWABLE_TABLET | Freq: Two times a day (BID) | ORAL | Status: DC
  Administered 2016-07-15 – 2016-07-19 (×9): 81 mg via ORAL
  Filled 2016-07-15 (×9): qty 1

## 2016-07-15 MED ORDER — ALUM & MAG HYDROXIDE-SIMETH 200-200-20 MG/5ML PO SUSP: 30.0000 mL | ORAL | Status: DC | PRN

## 2016-07-15 MED ORDER — CEFAZOLIN IN D5W 1 GM/50ML IV SOLN
1.0000 g | Freq: Four times a day (QID) | INTRAVENOUS | Status: AC
  Administered 2016-07-15: 1 g via INTRAVENOUS
  Filled 2016-07-15 (×3): qty 50

## 2016-07-15 MED ORDER — ONDANSETRON HCL 4 MG PO TABS: 4.0000 mg | ORAL_TABLET | Freq: Four times a day (QID) | ORAL | 0 refills | Status: DC | PRN

## 2016-07-15 MED ORDER — POLYETHYLENE GLYCOL 3350 17 G PO PACK: 17.0000 g | PACK | Freq: Every day | ORAL | Status: DC | PRN

## 2016-07-15 MED ORDER — DOCUSATE SODIUM 100 MG PO CAPS: 100.0000 mg | ORAL_CAPSULE | Freq: Two times a day (BID) | ORAL | 3 refills | Status: DC

## 2016-07-15 MED ORDER — LISINOPRIL-HYDROCHLOROTHIAZIDE 10-12.5 MG PO TABS: 1.0000 | ORAL_TABLET | Freq: Every day | ORAL | Status: DC

## 2016-07-15 MED ORDER — DOCUSATE SODIUM 100 MG PO CAPS
100.0000 mg | ORAL_CAPSULE | Freq: Two times a day (BID) | ORAL | Status: DC
  Administered 2016-07-15 – 2016-07-19 (×9): 100 mg via ORAL
  Filled 2016-07-15 (×9): qty 1

## 2016-07-15 MED ORDER — ACETAMINOPHEN 325 MG PO TABS
650.0000 mg | ORAL_TABLET | Freq: Four times a day (QID) | ORAL | Status: DC | PRN
  Administered 2016-07-17 – 2016-07-19 (×7): 650 mg via ORAL
  Filled 2016-07-15 (×7): qty 2

## 2016-07-15 MED ORDER — HYDROCODONE-ACETAMINOPHEN 5-325 MG PO TABS: 1.0000 | ORAL_TABLET | ORAL | 0 refills | Status: DC | PRN

## 2016-07-15 MED ORDER — HYDROCHLOROTHIAZIDE 12.5 MG PO CAPS
12.5000 mg | ORAL_CAPSULE | Freq: Every day | ORAL | Status: DC
  Administered 2016-07-16 – 2016-07-19 (×4): 12.5 mg via ORAL
  Filled 2016-07-15 (×5): qty 1

## 2016-07-15 MED ORDER — HYDROMORPHONE HCL 1 MG/ML IJ SOLN
0.2000 mg | INTRAMUSCULAR | Status: DC | PRN
  Administered 2016-07-15: 0.2 mg via INTRAVENOUS
  Filled 2016-07-15: qty 1

## 2016-07-15 MED ORDER — ALFUZOSIN HCL ER 10 MG PO TB24
10.0000 mg | ORAL_TABLET | Freq: Every day | ORAL | Status: DC
  Administered 2016-07-15 – 2016-07-19 (×5): 10 mg via ORAL
  Filled 2016-07-15 (×5): qty 1

## 2016-07-15 MED ORDER — MAGNESIUM OXIDE 400 (241.3 MG) MG PO TABS
400.0000 mg | ORAL_TABLET | Freq: Every day | ORAL | Status: DC
  Administered 2016-07-15 – 2016-07-19 (×5): 400 mg via ORAL
  Filled 2016-07-15 (×5): qty 1

## 2016-07-15 MED ORDER — METOCLOPRAMIDE HCL 5 MG PO TABS: 5.0000 mg | ORAL_TABLET | Freq: Three times a day (TID) | ORAL | Status: DC | PRN

## 2016-07-15 MED ORDER — PHENOL 1.4 % MT LIQD
1.0000 | OROMUCOSAL | Status: DC | PRN
Start: 2016-07-15 — End: 2016-07-19

## 2016-07-15 MED ORDER — DEXAMETHASONE SODIUM PHOSPHATE 10 MG/ML IJ SOLN
10.0000 mg | Freq: Once | INTRAMUSCULAR | Status: AC
  Administered 2016-07-15: 10 mg via INTRAVENOUS
  Filled 2016-07-15: qty 1

## 2016-07-15 MED ORDER — CO Q 10 100 MG PO CAPS: 1.0000 | ORAL_CAPSULE | Freq: Two times a day (BID) | ORAL | Status: DC

## 2016-07-15 MED ORDER — HYDROCHLOROTHIAZIDE 12.5 MG PO CAPS: 12.5000 mg | ORAL_CAPSULE | Freq: Every day | ORAL | Status: DC

## 2016-07-15 MED ORDER — CEFAZOLIN IN D5W 1 GM/50ML IV SOLN
1.0000 g | Freq: Once | INTRAVENOUS | Status: AC
  Administered 2016-07-15: 1 g via INTRAVENOUS
  Filled 2016-07-15: qty 50

## 2016-07-15 MED ORDER — ONDANSETRON HCL 4 MG PO TABS: 4.0000 mg | ORAL_TABLET | Freq: Four times a day (QID) | ORAL | Status: DC | PRN

## 2016-07-15 MED ORDER — SENNA 8.6 MG PO TABS: 2.0000 | ORAL_TABLET | Freq: Every day | ORAL | 3 refills | Status: DC

## 2016-07-15 NOTE — Progress Notes (Signed)
Physical Therapy Treatment Patient Details Name: Jeff Rivera MRN: [id number] DOB: [birth year] Today's Date: 07/15/2016    History of Present Illness Patient is a 75 y/o male with hx of left TKA, HTN, ED, CAD, CKD, aortic stenosis presents s/p Rt TKA.     PT Comments    Patient progressing well with mobility this afternoon. Improved ambulation distance with cues for gait mechanics and safety. Fatigues. Tolerated exercises. Pt motivated to return to PLOF.  Plan to provide exercise handout tomorrow and perform stair training as tolerated prior to return home. Will follow.    Follow Up Recommendations  Home health PT;Supervision/Assistance - 24 hour;Supervision for mobility/OOB     Equipment Recommendations  None recommended by PT    Recommendations for Other Services       Precautions / Restrictions Precautions Precautions: Knee Precaution Booklet Issued: No Precaution Comments: Reviewed no pillow under knee and precautions. Restrictions Weight Bearing Restrictions: Yes RLE Weight Bearing: Weight bearing as tolerated    Mobility  Bed Mobility            General bed mobility comments: Sitting in chair upon PT arrival.   Transfers Overall transfer level: Needs assistance Equipment used: Rolling walker (2 wheeled) Transfers: Sit to/from Stand Sit to Stand: Mod assist         General transfer comment: Assist to power to standing with cues for hand placement/technique. Increased time to stand. Difficulty transitioning Ues from arm rests to RW.  Ambulation/Gait Ambulation/Gait assistance: Min assist Ambulation Distance (Feet): 40 Feet Assistive device: Rolling walker (2 wheeled) Gait Pattern/deviations: Step-to pattern;Step-through pattern;Decreased stance time - right;Decreased step length - left;Trunk flexed Gait velocity: decreased Gait velocity interpretation: Below normal speed for age/gender General Gait Details: Cues for step through gait and RW proximity.  Cues for knee extension during stance phase to activate quads and to relax shoulders. UEs fatigue.   Stairs            Wheelchair Mobility    Modified Rankin (Stroke Patients Only)       Balance Overall balance assessment: Needs assistance Sitting-balance support: Feet supported;No upper extremity supported Sitting balance-[name]: Fair Sitting balance - Comments: Able to reach outside BoS to adjust socks without LOB.   Standing balance support: During functional activity Standing balance-[name]: Poor Standing balance comment: Reliant on BUEs for support in standing. Cues for upright.                    Cognition Arousal/Alertness: Awake/alert Behavior During Therapy: WFL for tasks assessed/performed Overall Cognitive Status: Within Functional Limits for tasks assessed       Memory: Decreased short-term memory              Exercises Total Joint Exercises Ankle Circles/Pumps: Both;10 reps;Supine Quad Sets: Both;10 reps;Supine Long Arc Quad: Right;10 reps;Seated Goniometric ROM: 5-75 degrees knee AROM    General Comments General comments (skin integrity, edema, etc.): Family present during session.      Pertinent Vitals/Pain Pain Assessment: Faces Faces Pain Scale: Hurts even more Pain Location: right knee Pain Descriptors / Indicators: Sore;Operative site guarding;Grimacing;Guarding Pain Intervention(s): Monitored during session;Repositioned;Premedicated before session    [name] expects to be discharged to:: Private residence Living Arrangements: Spouse/significant other Available Help at Discharge: Family;Available PRN/intermittently Type of Home: House Home Access: Stairs to enter Entrance Stairs-Rails: Right Home Layout: One level Home Equipment: Walker - 2 wheels;Cane - single point;Bedside commode;Shower seat - built in      Prior  Function Level of Independence: Independent      Comments: Manages  everything at home - finances, cooking, cleaning, driving. Wife has aphasia and not able to do any of these things anymore. Walks dog twice per day.   PT Goals (current goals can now be found in the care plan section) Acute Rehab PT Goals Patient Stated Goal: to go home PT Goal Formulation: With patient Time For Goal Achievement: 07/29/16 Potential to Achieve Goals: Fair Progress towards PT goals: Progressing toward goals    Frequency    7X/week      PT Plan Current plan remains appropriate    Co-evaluation             End of Session Equipment Utilized During Treatment: Gait belt Activity Tolerance: Patient tolerated treatment well Patient left: in chair;with call bell/phone within reach;with family/visitor present     Time: [id number] PT Time Calculation (min) (ACUTE ONLY): 27 min  Charges:  $Gait Training: 23-37 mins                    G Codes:      Chibuike Fleek A Demeco Ducksworth 07/15/2016, 12:05 PM Wray Kearns, [place], DPT [phone]

## 2016-07-15 NOTE — Progress Notes (Signed)
   Subjective:  Patient reports pain as mild to moderate.  Denies N/V/CP/SOB.  Objective:   VITALS:   Vitals:   07/14/16 2310 07/15/16 0145 07/15/16 0600 07/15/16 0800  BP: 122/75 116/71 (!) 105/57 (!) 92/44  Pulse: 84 90 79   Resp: 17 17 17    Temp:  98.5 F (36.9 C) 97.7 F (36.5 C)   TempSrc:  Oral Oral   SpO2: 100% 97% 97%   Weight:      Height:        NAD ABD soft Sensation intact distally Intact pulses distally Dorsiflexion/Plantar flexion intact Compartment soft HV ss   Lab Results  Component Value Date   WBC 7.3 07/15/2016   HGB 10.2 (L) 07/15/2016   HCT 30.3 (L) 07/15/2016   MCV 96.5 07/15/2016   PLT 164 07/15/2016   BMET    Component Value Date/Time   NA 135 07/15/2016 0601   NA 135 (L) 11/04/2014 1545   K 4.1 07/15/2016 0601   K 5.6 (H) 11/04/2014 1545   CL 104 07/15/2016 0601   CL 102 11/04/2014 1545   CO2 28 07/15/2016 0601   CO2 28 11/04/2014 1545   GLUCOSE 97 07/15/2016 0601   GLUCOSE 136 (H) 11/04/2014 1545   BUN 20 07/15/2016 0601   BUN 30 (H) 11/04/2014 1545   CREATININE 1.53 (H) 07/15/2016 0601   CREATININE 1.38 (H) 07/20/2015 1146   CALCIUM 8.8 (L) 07/15/2016 0601   CALCIUM 8.6 11/04/2014 1545   GFRNONAA 42 (L) 07/15/2016 0601   GFRNONAA 48 (L) 11/04/2014 1545   GFRAA 49 (L) 07/15/2016 0601   GFRAA 59 (L) 11/04/2014 1545     Assessment/Plan: 1 Day Post-Op   Principal Problem:   Osteoarthritis of right knee  WBAT RLE with walker DVT ppx: ASA, SCDs, TEDs PT/OT PO pain control Dispo: D/C home tomorrow, outpatient PT already set up, has DME at home, No HH needs   Eleni Frank, Horald Pollen 07/15/2016, 12:59 PM   Rod Can, MD Cell [phone]

## 2016-07-15 NOTE — Evaluation (Signed)
Physical Therapy Evaluation Patient Details Name: Jeff Rivera MRN: [id number] DOB: 1941-07-07 Today's Date: 07/15/2016   History of Present Illness  Patient is a 75 y/o male with hx of left TKA, HTN, ED, CAD, CKD, aortic stenosis presents s/p Rt TKA.   Clinical Impression  Patient presents with pain and post surgical deficits s/p above surgery. Pain limiting mobility. Increased time to perform all mobility due to pain. Tolerated taking a few steps to get to chair with Min A for balance/safety. Pt does not have appropriate support at home from wife. Education re: knee precautions, positioning, exercises etc. Pt would be better off going to short term SNF to maximize independence and mobility however pt declining due to need to help wife. Will follow acutely.     Follow Up Recommendations Home health PT;Supervision/Assistance - 24 hour;Supervision for mobility/OOB (pt declining rehab)    Equipment Recommendations  None recommended by PT    Recommendations for Other Services       Precautions / Restrictions Precautions Precautions: Knee Precaution Booklet Issued: No Precaution Comments: Reviewed no pillow under knee and precautions. Restrictions Weight Bearing Restrictions: Yes RLE Weight Bearing: Weight bearing as tolerated      Mobility  Bed Mobility Overal bed mobility: Needs Assistance Bed Mobility: Supine to Sit     Supine to sit: Min assist;HOB elevated     General bed mobility comments: Increased time. Assist to bring RLE to EOB. Use of rails.  Transfers Overall transfer level: Needs assistance Equipment used: Rolling walker (2 wheeled) Transfers: Sit to/from Stand Sit to Stand: Mod assist;From elevated surface         General transfer comment: Assist to power to standing with cues for hand placement/technique. Increased time to stand. Transferred to chair post ambulation bout.  Ambulation/Gait Ambulation/Gait assistance: Min assist Ambulation Distance  (Feet): 6 Feet Assistive device: Rolling walker (2 wheeled) Gait Pattern/deviations: Decreased stance time - right;Decreased step length - left;Trunk flexed;Step-to pattern Gait velocity: decreased Gait velocity interpretation: Below normal speed for age/gender General Gait Details: Slow, unsteady gait with heavy reliance on UEs for support; very slow, Pain with WB.   Stairs            Wheelchair Mobility    Modified Rankin (Stroke Patients Only)       Balance Overall balance assessment: Needs assistance Sitting-balance support: Feet supported;Single extremity supported Sitting balance-[name]: Fair Sitting balance - Comments: Able to reach outside BoS to adjust socks without LOB.   Standing balance support: During functional activity Standing balance-[name]: Poor Standing balance comment: Reliant on BUEs for support in standing. Cues for upright.                             Pertinent Vitals/Pain Pain Assessment: Faces Faces Pain Scale: Hurts whole lot Pain Location: right knee Pain Descriptors / Indicators: Sore;Operative site guarding;Grimacing;Guarding Pain Intervention(s): Monitored during session;Repositioned;Premedicated before session;Limited activity within patient's tolerance    Home Living Family/patient expects to be discharged to:: Private residence Living Arrangements: Spouse/significant other Available Help at Discharge: Family;Available PRN/intermittently Type of Home: House Home Access: Stairs to enter Entrance Stairs-Rails: Right Entrance Stairs-Number of Steps: 3 Home Layout: One level Home Equipment: Walker - 2 wheels;Cane - single point;Bedside commode;Shower seat - built in      Prior Function Level of Independence: Independent         Comments: Manages everything at home - finances, cooking, cleaning, driving. Wife has  aphasia and not able to do any of these things anymore. Walks dog twice per day.     Hand  Dominance   Dominant Hand: Right    Extremity/Trunk Assessment   Upper Extremity Assessment: Defer to OT evaluation           Lower Extremity Assessment: RLE deficits/detail RLE Deficits / Details: Limited AROM/strength secondary to pain and surgery.       Communication   Communication: No difficulties  Cognition Arousal/Alertness: Awake/alert Behavior During Therapy: WFL for tasks assessed/performed Overall Cognitive Status: Within Functional Limits for tasks assessed                      General Comments      Exercises Total Joint Exercises Ankle Circles/Pumps: Both;10 reps;Supine Quad Sets: Both;10 reps;Supine Gluteal Sets: Both;10 reps;Supine Hip ABduction/ADduction: Right;5 reps;Seated   Assessment/Plan    PT Assessment Patient needs continued PT services  PT Problem List Decreased strength;Decreased mobility;Decreased range of motion;Decreased balance;Decreased activity tolerance;Pain          PT Treatment Interventions DME instruction;Therapeutic activities;Gait training;Therapeutic exercise;Patient/family education;Balance training;Functional mobility training;Stair training    PT Goals (Current goals can be found in the Care Plan section)  Acute Rehab PT Goals Patient Stated Goal: to go home PT Goal Formulation: With patient Time For Goal Achievement: 07/29/16 Potential to Achieve Goals: Fair    Frequency 7X/week   Barriers to discharge Decreased caregiver support Pt's wife not able to assist much.    Co-evaluation               End of Session Equipment Utilized During Treatment: Gait belt Activity Tolerance: Patient limited by pain Patient left: in chair;with call bell/phone within reach Nurse Communication: Mobility status         Time: [id number] PT Time Calculation (min) (ACUTE ONLY): 30 min   Charges:   PT Evaluation $PT Eval Moderate Complexity: 1 Procedure PT Treatments $Therapeutic Activity: 8-22 mins   PT G  Codes:        Olyvia Gopal A Birch Farino 07/15/2016, 10:12 AM Wray Kearns, PT, DPT [phone]

## 2016-07-15 NOTE — Discharge Summary (Signed)
Physician Discharge Summary  Patient ID: Jeff Rivera MRN: [id number] DOB/AGE: 15-Nov-1940 75 y.o.  Admit date: 07/14/2016 Discharge date: 07/19/2016  Admission Diagnoses:  Osteoarthritis of right knee  Discharge Diagnoses:  Principal Problem:   Osteoarthritis of right knee   Past Medical History:  Diagnosis Date  . Aortic stenosis    mild   . Arthritis    OA  . CAD (coronary artery disease)   . Chronic kidney disease    creatine 1.5 was 1.5 09/2015  . Complication of anesthesia    patient was confused for 6-8 days.  doesnt remember anything from time he registered several days after he was admitted to [facility]  . Dyslipidemia   . ED (erectile dysfunction)   . GERD (gastroesophageal reflux disease)   . Heart murmur   . History of kidney stones   . Hypertension     Surgeries: Procedure(s): COMPUTER NAVIGATION ASSISTED TOTAL KNEE ARTHROPLASTY RIGHT on 07/14/2016 - 07/15/2016   Consultants (if any):   Discharged Condition: Improved  Hospital Course: Jeff Rivera is an 75 y.o. male who was admitted 07/14/2016 with a diagnosis of Osteoarthritis of right knee and went to the operating room on 07/14/2016 and underwent the above named procedures.    He was given perioperative antibiotics:  Anti-infectives    Start     Dose/Rate Route Frequency Ordered Stop   07/15/16 1930  ceFAZolin (ANCEF) IVPB 1 g/50 mL premix     1 g 100 mL/hr over 30 Minutes Intravenous  Once 07/15/16 1916 07/15/16 2224   07/15/16 0600  ceFAZolin (ANCEF) IVPB 2g/100 mL premix     2 g 200 mL/hr over 30 Minutes Intravenous On call to O.R. 07/14/16 1400 07/14/16 1915   07/15/16 0400  ceFAZolin (ANCEF) IVPB 1 g/50 mL premix     1 g 100 mL/hr over 30 Minutes Intravenous Every 6 hours 07/15/16 0307 07/15/16 1559   07/14/16 1403  ceFAZolin (ANCEF) 2-4 GM/100ML-% IVPB    Comments:  Leandrew Koyanagi   : cabinet override      07/14/16 1403 07/14/16 1915    . Postoperatively, he was made weightbearing as tolerated  with a walker. He remained afebrile with stable vital signs.  He was given sequential compression devices, early ambulation, and ASA for DVT prophylaxis.  He was slow to progress with physical therapy, and skilled nursing placement was recommended.  He benefited maximally from the hospital stay and there were no complications.    Recent vital signs:  Vitals:   07/18/16 1947 07/19/16 0427  BP: 108/63 111/64  Pulse: 99 92  Resp: 16 16  Temp: 98.4 F (36.9 C) 99.2 F (37.3 C)    Recent laboratory studies:  Lab Results  Component Value Date   HGB 9.3 (L) 07/17/2016   HGB 10.2 (L) 07/16/2016   HGB 10.2 (L) 07/15/2016   Lab Results  Component Value Date   WBC 9.3 07/17/2016   PLT 150 07/17/2016   Lab Results  Component Value Date   INR 1.00 10/20/2014   Lab Results  Component Value Date   NA 137 07/16/2016   K 4.7 07/16/2016   CL 107 07/16/2016   CO2 23 07/16/2016   BUN 24 (H) 07/16/2016   CREATININE 1.59 (H) 07/16/2016   GLUCOSE 126 (H) 07/16/2016    Discharge Medications:     Medication List    STOP taking these medications   aspirin 81 MG tablet Replaced by:  aspirin 81 MG chewable tablet  TAKE these medications   alfuzosin 10 MG 24 hr tablet Commonly known as:  UROXATRAL Take 1 tablet by mouth daily.   aspirin 81 MG chewable tablet Chew 1 tablet (81 mg total) by mouth 2 (two) times daily with a meal. Replaces:  aspirin 81 MG tablet   Co Q 10 100 MG Caps Take 1 capsule by mouth 2 (two) times daily.   docusate sodium 100 MG capsule Commonly known as:  COLACE Take 1 capsule (100 mg total) by mouth 2 (two) times daily.   Fish Oil 1200 MG Caps Take 1 capsule by mouth daily.   hydrochlorothiazide 12.5 MG capsule Commonly known as:  MICROZIDE Take 1 capsule (12.5 mg total) by mouth daily.   lisinopril-hydrochlorothiazide 10-12.5 MG tablet Commonly known as:  PRINZIDE,ZESTORETIC Take 1 tablet by mouth daily.   MAGNESIUM PO Take 400 mg by  mouth daily.   meloxicam 7.5 MG tablet Commonly known as:  MOBIC Take 1 tablet by mouth daily as needed for pain.   multivitamin tablet Take 1 tablet by mouth daily.   niacin 500 MG tablet Take 500 mg by mouth 2 (two) times daily with a meal.   omeprazole 20 MG capsule Commonly known as:  PRILOSEC Take 20 mg by mouth daily.   ondansetron 4 MG tablet Commonly known as:  ZOFRAN Take 1 tablet (4 mg total) by mouth every 6 (six) hours as needed for nausea.   senna 8.6 MG Tabs tablet Commonly known as:  SENOKOT Take 2 tablets (17.2 mg total) by mouth at bedtime.   simvastatin 20 MG tablet Commonly known as:  ZOCOR Take 20 mg by mouth every evening.   traMADol 50 MG tablet Commonly known as:  ULTRAM Take 1-2 tablets (50-100 mg total) by mouth every 6 (six) hours as needed for moderate pain.   Vitamin D3 2000 units capsule Take 2,000 Units by mouth daily.       Diagnostic Studies: Dg Knee Right Port  Result Date: 07/15/2016 CLINICAL DATA:  Postop right knee EXAM: PORTABLE RIGHT KNEE - 1-2 VIEW COMPARISON:  Report from 12/23/2011 FINDINGS: Right total knee arthroplasty with patellar resurfacing. No hardware failure nor postoperative fracture. Subcutaneous and intra-articular emphysema is noted from recent surgery. IMPRESSION: Right total knee arthroplasty without hardware failure nor postoperative fracture. Subcutaneous and intra-articular emphysema from recent procedure. Electronically Signed   By: Ashley Royalty M.D.   On: 07/15/2016 00:04    Disposition: SNF  Discharge Instructions    Call MD / Call 911    Complete by:  As directed    If you experience chest pain or shortness of breath, CALL 911 and be transported to the hospital emergency room.  If you develope a fever above 101 F, pus (white drainage) or increased drainage or redness at the wound, or calf pain, call your surgeon's office.   Call MD / Call 911    Complete by:  As directed    If you experience chest pain or  shortness of breath, CALL 911 and be transported to the hospital emergency room.  If you develope a fever above 101 F, pus (white drainage) or increased drainage or redness at the wound, or calf pain, call your surgeon's office.   Constipation Prevention    Complete by:  As directed    Drink plenty of fluids.  Prune juice may be helpful.  You may use a stool softener, such as Colace (over the counter) 100 mg twice a day.  Use MiraLax (over  the counter) for constipation as needed.   Diet - low sodium heart healthy    Complete by:  As directed    Do not put a pillow under the knee. Place it under the heel.    Complete by:  As directed    Driving restrictions    Complete by:  As directed    No driving for 6 weeks   Increase activity slowly as tolerated    Complete by:  As directed    Lifting restrictions    Complete by:  As directed    No lifting for 6 weeks   TED hose    Complete by:  As directed    Use stockings (TED hose) for 2 weeks on both leg(s).  You may remove them at night for sleeping.      Follow-up Information    Latiesha Harada, Horald Pollen, MD. Schedule an appointment as soon as possible for a visit in 2 weeks.   Specialty:  Orthopedic Surgery Why:  For wound re-check Contact information: [address] [place] [address] [phone]            Signed: Elie Goody 07/19/2016, 9:03 AM

## 2016-07-15 NOTE — Care Management Note (Signed)
Case Management Note  Patient Details  Name: Jeff Rivera MRN: [id number] Date of Birth: 1941/01/06  Subjective/Objective:    75 yr old male s/p right total knee arthroplasty.                Action/Plan: Case manager spoke with patient and his daughter in law concerning discharge plan. Patient was preoperatively setup with [facility] for therapy, but will now be going directly to Outpatient therapy at [facility]. Patient has rolling walker and 3in1 at home. Will have family support at discharge.    Expected Discharge Date:   07/15/16               Expected Discharge Plan:  Home/Self Care  In-House Referral:     Discharge planning Services  CM Consult  Post Acute Care Choice:  NA Choice offered to:  Patient, Adult Children  DME Arranged:  N/A DME Agency:     HH Arranged:  NA HH Agency:  NA  Status of Service:  Completed, signed off  If discussed at [place] of Stay Meetings, dates discussed:    Additional Comments:  Ninfa Meeker, RN 07/15/2016, 2:41 PM

## 2016-07-16 ENCOUNTER — Encounter (HOSPITAL_COMMUNITY): Payer: Self-pay | Admitting: Orthopedic Surgery

## 2016-07-16 LAB — CBC
HEMATOCRIT: 30.4 % — AB (ref 39.0–52.0)
HEMOGLOBIN: 10.2 g/dL — AB (ref 13.0–17.0)
MCH: 32.5 pg (ref 26.0–34.0)
MCHC: 33.6 g/dL (ref 30.0–36.0)
MCV: 96.8 fL (ref 78.0–100.0)
Platelets: 169 10*3/uL (ref 150–400)
RBC: 3.14 MIL/uL — ABNORMAL LOW (ref 4.22–5.81)
RDW: 13.7 % (ref 11.5–15.5)
WBC: 15.6 10*3/uL — ABNORMAL HIGH (ref 4.0–10.5)

## 2016-07-16 LAB — BASIC METABOLIC PANEL
Anion gap: 7 (ref 5–15)
BUN: 24 mg/dL — AB (ref 6–20)
CHLORIDE: 107 mmol/L (ref 101–111)
CO2: 23 mmol/L (ref 22–32)
CREATININE: 1.59 mg/dL — AB (ref 0.61–1.24)
Calcium: 9.4 mg/dL (ref 8.9–10.3)
GFR calc non Af Amer: 41 mL/min — ABNORMAL LOW (ref >60)
GFR, EST AFRICAN AMERICAN: 47 mL/min — AB (ref >60)
Glucose, Bld: 126 mg/dL — ABNORMAL HIGH (ref 65–99)
Potassium: 4.7 mmol/L (ref 3.5–5.1)
Sodium: 137 mmol/L (ref 135–145)

## 2016-07-16 MED ORDER — TRAMADOL HCL 50 MG PO TABS
50.0000 mg | ORAL_TABLET | Freq: Four times a day (QID) | ORAL | Status: DC | PRN
  Administered 2016-07-16 – 2016-07-19 (×9): 50 mg via ORAL
  Filled 2016-07-16 (×9): qty 1

## 2016-07-16 MED ORDER — TRAMADOL HCL 50 MG PO TABS: 50.0000 mg | ORAL_TABLET | Freq: Four times a day (QID) | ORAL | 0 refills | Status: DC | PRN

## 2016-07-16 NOTE — Clinical Social Work Note (Signed)
Clinical Social Work Assessment  Patient Details  Name: Jeff Rivera MRN: [id number] Date of Birth: 1941/01/22  Date of referral:  07/16/16               Reason for consult:  Facility Placement                Permission sought to share information with:   (Facilities) Permission granted to share information::   (Facilities)  Name::        Agency::     Relationship::     Contact Information:     Housing/Transportation Living arrangements for the past 2 months:  Single Family Home (Patient states that he lives at home in [place] with his wife.) Source of Information:  Patient Patient Interpreter Needed:  None Criminal Activity/Legal Involvement Pertinent to Current Situation/Hospitalization:  No - Comment as needed Significant Relationships:  Spouse Lives with:  Spouse Do you feel safe going back to the place where you live?   (Patient states that he would not mind SW referring him to facility. However, he said he would still be interested in home health.) Need for family participation in patient care:  Yes (Comment)  Care giving concerns:  Patient states that prior to surgery he was able to complete his ADLs. However, he states that he now needs assistance. SW has referred pt to facilities.    Social Worker assessment / plan:  SW met with patient at bedside. The patient is alert and oriented. The pt was irritable and not friendly. The patient states that SW can refer him to facilities. SW has referred the pt to facilities.   Employment status:  Retired Forensic scientist:  Medicare PT Recommendations:  [place] / Referral to community resources:   (SNF)  Patient/Family's Response to care:  The pt seems unsure as if he wants a SNF.  Patient/Family's Understanding of and Emotional Response to Diagnosis, Current Treatment, and Prognosis:  The pt has no questions.   Emotional Assessment Appearance:  Appears stated age Attitude/Demeanor/Rapport:   Hostile, Uncooperative Affect (typically observed):  Irritable Orientation:  Oriented to Self, Oriented to Place, Oriented to  Time, Oriented to Situation Alcohol / Substance use:  Not Applicable Psych involvement (Current and /or in the community):  No (Comment)  Discharge Needs  Concerns to be addressed:   (Patient states that he does not mind being referred to SNF. However, he states that he is still interested in home health.) Readmission within the last 30 days:  No Current discharge risk:  None Barriers to Discharge:  No Barriers Identified   Bernita Buffy 07/16/2016, 6:42 PM

## 2016-07-16 NOTE — NC FL2 (Signed)
[place] LEVEL OF CARE SCREENING TOOL     IDENTIFICATION  Patient Name: Jeff Rivera Birthdate: 1941/10/01 Sex: male Admission Date (Current Location): 07/14/2016  [facility] and [place] Number:  Herbalist and Address:  The [place], [place] [address] [place], [place] [address]      Provider Number: [id number]  Attending Physician Name and Address:  Rod Can, MD  Relative Name and Phone Number:       Current Level of Care: Hospital Recommended Level of Care: [place] Prior Approval Number:    Date Approved/Denied:   PASRR Number:  ([id number])  Discharge Plan: SNF    Current Diagnoses: Patient Active Problem List   Diagnosis Date Noted  . Osteoarthritis of right knee 07/14/2016  . Arthritis of left knee 10/27/2014  . 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 CAD (coronary artery disease) 09/12/2013  . Aortic stenosis 09/12/2013  . Dyslipidemia 09/12/2013  . HTN (hypertension) 09/12/2013  . DOE (dyspnea on exertion) 09/12/2013    Orientation RESPIRATION BLADDER Height & Weight     Self, Situation, Place, Time  Normal Continent Weight: 175 lb 5 oz (79.5 kg) Height:  5\' 8"  (172.7 cm)  BEHAVIORAL SYMPTOMS/MOOD NEUROLOGICAL BOWEL NUTRITION STATUS      Continent    AMBULATORY STATUS COMMUNICATION OF NEEDS Skin   Limited Assist Verbally Normal                       Personal Care Assistance Level of Assistance  Bathing, Dressing Bathing Assistance: Limited assistance   Dressing Assistance: Limited assistance     Functional Limitations Info             SPECIAL CARE FACTORS FREQUENCY                       Contractures      Additional Factors Info   (Full)               Current Medications (07/16/2016):  This is the current hospital active medication list Current Facility-Administered Medications  Medication Dose Route Frequency Provider Last Rate Last Dose   . 0.9 %  sodium chloride infusion   Intravenous Continuous Rod Can, MD 150 mL/hr at 07/15/16 1524    . acetaminophen (TYLENOL) tablet 650 mg  650 mg Oral Q6H PRN Rod Can, MD       Or  . acetaminophen (TYLENOL) suppository 650 mg  650 mg Rectal Q6H PRN Rod Can, MD      . alfuzosin (UROXATRAL) 24 hr tablet 10 mg  10 mg Oral Daily Rod Can, MD   10 mg at 07/16/16 [id number]  . alum & mag hydroxide-simeth (MAALOX/MYLANTA) 200-200-20 MG/5ML suspension 30 mL  30 mL Oral Q4H PRN Rod Can, MD      . aspirin chewable tablet 81 mg  81 mg Oral BID WC Rod Can, MD   81 mg at 07/16/16 1802  . cholecalciferol (VITAMIN D) tablet 2,000 Units  2,000 Units Oral Daily Rod Can, MD   2,000 Units at 07/16/16 [phone]  . diphenhydrAMINE (BENADRYL) 12.5 MG/5ML elixir 12.5-25 mg  12.5-25 mg Oral Q4H PRN Rod Can, MD      . docusate sodium (COLACE) capsule 100 mg  100 mg Oral BID Rod Can, MD   100 mg at 07/16/16 [id number]  . hydrochlorothiazide (MICROZIDE) capsule 12.5 mg  12.5 mg Oral Daily Rod Can, MD  12.5 mg at 07/16/16 0922  . HYDROcodone-acetaminophen (NORCO/VICODIN) 5-325 MG per tablet 1-2 tablet  1-2 tablet Oral Q4H PRN Rod Can, MD   2 tablet at 07/15/16 1333  . lisinopril (PRINIVIL,ZESTRIL) tablet 10 mg  10 mg Oral Daily Rod Can, MD   10 mg at 07/16/16 [id number]  . magnesium oxide (MAG-OX) tablet 400 mg  400 mg Oral Daily Rod Can, MD   400 mg at 07/16/16 [id number]  . menthol-cetylpyridinium (CEPACOL) lozenge 3 mg  1 lozenge Oral PRN Rod Can, MD       Or  . phenol (CHLORASEPTIC) mouth spray 1 spray  1 spray Mouth/Throat PRN Rod Can, MD      . methocarbamol (ROBAXIN) tablet 500 mg  500 mg Oral Q6H PRN Rod Can, MD   500 mg at 07/14/16 2325   Or  . methocarbamol (ROBAXIN) 500 mg in dextrose 5 % 50 mL IVPB  500 mg Intravenous Q6H PRN Rod Can, MD      . metoCLOPramide (REGLAN) tablet 5-10 mg  5-10 mg Oral Q8H PRN Rod Can, MD       Or  . metoCLOPramide (REGLAN) injection 5-10 mg  5-10 mg Intravenous Q8H PRN Rod Can, MD      . ondansetron (ZOFRAN) tablet 4 mg  4 mg Oral Q6H PRN Rod Can, MD       Or  . ondansetron (ZOFRAN) injection 4 mg  4 mg Intravenous Q6H PRN Rod Can, MD      . pantoprazole (PROTONIX) EC tablet 40 mg  40 mg Oral Daily Rod Can, MD   40 mg at 07/16/16 [id number]  . polyethylene glycol (MIRALAX / GLYCOLAX) packet 17 g  17 g Oral Daily PRN Rod Can, MD      . senna (SENOKOT) tablet 17.2 mg  2 tablet Oral QHS Rod Can, MD   17.2 mg at 07/15/16 2154  . simvastatin (ZOCOR) tablet 20 mg  20 mg Oral QPM Rod Can, MD   20 mg at 07/16/16 1802  . traMADol (ULTRAM) tablet 50 mg  50 mg Oral Q6H PRN Rod Can, MD   50 mg at 07/16/16 1608     Discharge Medications: Please see discharge summary for a list of discharge medications.  Relevant Imaging Results:  Relevant Lab Results:   Additional Information  [id number])  Venetia Maxon, Lynnda Child

## 2016-07-16 NOTE — Progress Notes (Addendum)
Physical Therapy Treatment Patient Details Name: Jeff Rivera MRN: [id number] DOB: 12/23/1940 Today's Date: 07/16/2016    History of Present Illness Patient is a 75 y/o male with hx of left TKA, HTN, ED, CAD, CKD, aortic stenosis presents s/p Rt TKA.     PT Comments    Pt required increased assist and performed decreased activity from previous session.  Recommendation remain for skilled nursing to improve strength before returning home.    Follow Up Recommendations  SNF     Equipment Recommendations  None recommended by PT (TBD at next venue)    Recommendations for Other Services       Precautions / Restrictions Precautions Precautions: Knee Precaution Booklet Issued: No Precaution Comments: Reviewed no pillow under knee. Restrictions Weight Bearing Restrictions: Yes RLE Weight Bearing: Weight bearing as tolerated    Mobility  Bed Mobility Overal bed mobility: Needs Assistance Bed Mobility: Supine to Sit;Sit to Supine     Supine to sit: Min assist Sit to supine: Min assist   General bed mobility comments: Cues for sequencing and assist to advance RLE into and out of bed.  Pt fatigue and required bed in trendelenberg to scoot to [facility].    Transfers Overall transfer level: Needs assistance Equipment used: Rolling walker (2 wheeled) Transfers: Sit to/from Stand Sit to Stand: Mod assist         General transfer comment: pt more fatigued this pm with strong posterior lean.  Pt required cues for pushing from seated surface and cues for forward weight shifting, required mod assist to boost into standing and maintain balance.    Ambulation/Gait Ambulation/Gait assistance: Min assist;Mod assist Ambulation Distance (Feet): 58 Feet (decreased gait distance this pm secondary to fatigue.  ) Assistive device: Rolling walker (2 wheeled) Gait Pattern/deviations: Step-through pattern;Trunk flexed;Leaning posteriorly;Decreased stride length     General Gait Details: Pt  presents with fatigue in comparison to session this am.  Pt required cues for RW safety to keep both hands on RW and to avoid leaning on elbows.  Pt required cues for sequencing and upper trunk control.  As patient fatigues pt begins to shuffle more.  Pt more unsteady this pm.     Stairs            Wheelchair Mobility    Modified Rankin (Stroke Patients Only)       Balance Overall balance assessment: Needs assistance   Sitting balance-[name]: Fair       Standing balance-[name]: Poor Standing balance comment: LOB posterior                    Cognition Arousal/Alertness: Awake/alert Behavior During Therapy: WFL for tasks assessed/performed Overall Cognitive Status: Impaired/Different from baseline                      Exercises Total Joint Exercises Ankle Circles/Pumps: AROM;Both;10 reps;Supine Quad Sets: AROM;Right;10 reps;Supine Heel Slides: Right;10 reps;Supine;AAROM Hip ABduction/ADduction: AROM;Right;10 reps;Supine    General Comments        Pertinent Vitals/Pain Pain Assessment: 0-10 Faces Pain Scale: Hurts even more Pain Location: R knee Pain Descriptors / Indicators: Aching;Burning;Sore Pain Intervention(s): Monitored during session;Repositioned;Ice applied    Home Living                      Prior Function            PT Goals (current goals can now be found in the care plan section) Acute  Rehab PT Goals Patient Stated Goal: to go home Potential to Achieve Goals: Fair Progress towards PT goals: Progressing toward goals    Frequency    7X/week      PT Plan Current plan remains appropriate    Co-evaluation             End of Session Equipment Utilized During Treatment: Gait belt Activity Tolerance: Patient tolerated treatment well Patient left: in chair;with call bell/phone within reach;with family/visitor present     Time: [id number] PT Time Calculation (min) (ACUTE ONLY): 22 min  Charges:   $Gait Training: 8-22 mins                    G Codes:      Cristela Blue 08-02-16, 3:36 PM Governor Rooks, PTA pager [phone]

## 2016-07-16 NOTE — Evaluation (Addendum)
Occupational Therapy Evaluation Patient Details Name: Jeff Rivera MRN: [id number] DOB: 10/29/1940 Today's Date: 07/16/2016    History of Present Illness Patient is a 75 y/o male with hx of left TKA, HTN, ED, CAD, CKD, aortic stenosis presents s/p Rt TKA.    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 Pt presents with decreased safety awareness, orientation to place and situation, and knowledge of precautions. Pt currently requires moderate assist for tub transfer, min assist for all LB ADLs, min assist for functional ambulation during ADLs and much increased time. VC's required during all ADL to maintain safety, specifically for safe hand placement on RW. Additionally, pt reports that he got out of bed to "rearrange his room" yesterday without a RW or nursing support and was unsafe throughout. Pt lives with wife who is unable to provide necessary assistance/supervision at this time. Pt would greatly benefit from SNF placement to improve independence with ADL post-acute D/C. OT will continue to follow during acute stay.    Follow Up Recommendations  SNF    Equipment Recommendations  Other (comment) (TBD at next venue of care)       Precautions / Restrictions Precautions Precautions: Knee Precaution Booklet Issued: No Precaution Comments: Reviewed no pillow under knee. Restrictions Weight Bearing Restrictions: Yes RLE Weight Bearing: Weight bearing as tolerated      Mobility Bed Mobility Overal bed mobility: Needs Assistance Bed Mobility: Supine to Sit     Supine to sit: Min guard;HOB elevated (Increased time and VC's for safety)     General bed mobility comments: Sitting in chair upon PT arrival.   Transfers Overall transfer level: Needs assistance Equipment used: Rolling walker (2 wheeled) Transfers: Sit to/from Stand Sit to Stand: Min assist         General transfer comment: After 2 failed attempts unassisted  pt then required min assist to boost into standing.  Pt required cues for hand/foot placement and forward weight shifting.     Balance Overall balance assessment: Needs assistance Sitting-balance support: Feet supported;Single extremity supported Sitting balance-[name]: Fair Sitting balance - Comments: Able to reach out of base of support, but required min guard assist to prevent loss of balance when bending over.   Standing balance support: During functional activity;Bilateral upper extremity supported Standing balance-[name]: Poor Standing balance comment: LOb posterior                            ADL Overall ADL's : Needs assistance/impaired Eating/Feeding: Supervision/ safety;Set up;Sitting   Grooming: Set up;Sitting;Min guard Grooming Details (indicate cue type and reason): min guard for safety as pt is highly impulsive Upper Body Bathing: Min guard;Sitting   Lower Body Bathing: Minimal assistance;Sit to/from stand   Upper Body Dressing : Min guard;Sitting   Lower Body Dressing: Minimal assistance;Sit to/from stand Lower Body Dressing Details (indicate cue type and reason): Min assist for R leg threading and socks; decreased dynamic sitting balance and safety awareness while bending over. Decreased knowledge of limitations. Toilet Transfer: Minimal assistance;Ambulation;BSC;RW Toilet Transfer Details (indicate cue type and reason): Increased VC's for safety with RW and for hand placement. Toileting- Clothing Manipulation and Hygiene: Minimal assistance;Sit to/from stand Toileting - Clothing Manipulation Details (indicate cue type and reason): Min assist for sit<>stand and to maintain balance while standing with RW. Tub/ Shower Transfer: Moderate assistance;Cueing for safety;Cueing for sequencing;Ambulation;3 in Mudlogger  Details (indicate cue type and reason): Pt with limited knowledge of limitations and safety awareness of this task  requiring moderate assistance to complete safely. Functional mobility during ADLs: Moderate assistance;Minimal assistance;Rolling walker (Mod assist shower transfer; min assist functional ambulation) General ADL Comments: Educated pt on hand placement with RW, safe use of RW, and importance of calling nurse prior to getting up for any reason. Pt and family member educated concerning benefits of further rehabilitation services at SNF in order to improve independence at home.               Pertinent Vitals/Pain Pain Assessment: 0-10 Faces Pain Scale: Hurts even more Pain Location: R knee Pain Descriptors / Indicators: Aching;Burning;Sore Pain Intervention(s): Monitored during session;Repositioned;Ice applied     Hand Dominance Right   Extremity/Trunk Assessment Upper Extremity Assessment Upper Extremity Assessment: Generalized weakness (Difficulty at times to support self with RW.)   Lower Extremity Assessment Lower Extremity Assessment: RLE deficits/detail RLE Deficits / Details: Decreased ROM and strength post-operatively.       Communication Communication Communication: No difficulties   Cognition Arousal/Alertness: Awake/alert Behavior During Therapy: WFL for tasks assessed/performed Overall Cognitive Status: Impaired/Different from baseline Area of Impairment: Orientation Orientation Level: Situation (Pt reports seeing bugs on the floor.  )   Memory: Decreased short-term memory     Awareness: Emergent   General Comments: Pt reports becoming agitated in the night and walking up and down the halls and in his room without his walker to "rearrange" the furniture and attempt to unplug various equipment in room. Pt with much decreased awareness of safety, situation, and needs. Pt also reporting seeing bugs on the floor during gait training.              Home Living Family/patient expects to be discharged to:: Private residence Living Arrangements: Spouse/significant  other (Spouse requires assistance as well.) Available Help at Discharge: Family;Available PRN/intermittently Type of Home: House Home Access: Stairs to enter [employer] of Steps: 3 Entrance Stairs-Rails: Right Home Layout: One level     Bathroom Shower/Tub: Occupational psychologist: Standard Bathroom Accessibility: Yes How Accessible: Accessible via walker Home Equipment: [place] - 2 wheels;Cane - single point;Bedside commode;Shower seat - built in;Shower seat   Additional Comments: Pt reports that spouse requiers assistance at times for daily tasks and may be unable to assist him at home.      Prior Functioning/Environment Level of Independence: Independent        Comments: Manages everything at home. Reports that he has an aide who comes to his home to help with cooking. Walks his dog often.        OT Problem List: Decreased strength;Decreased range of motion;Decreased activity tolerance;Impaired balance (sitting and/or standing);Decreased knowledge of use of DME or AE;Pain;Decreased knowledge of precautions;Decreased safety awareness;Decreased cognition   OT Treatment/Interventions: Self-care/ADL training;Therapeutic exercise;DME and/or AE instruction;Therapeutic activities;Balance training;Patient/family education    OT Goals(Current goals can be found in the care plan section) Acute Rehab OT Goals Patient Stated Goal: to go home OT Goal Formulation: With patient/family Time For Goal Achievement: 07/30/16 Potential to Achieve Goals: Good ADL Goals Pt Will Perform Upper Body Bathing: Independently;sitting Pt Will Perform Lower Body Bathing: with supervision;sit to/from stand Pt Will Perform Upper Body Dressing: Independently;sitting Pt Will Perform Lower Body Dressing: with supervision;sit to/from stand Pt Will Transfer to Toilet: with min guard assist;bedside commode;ambulating Pt Will Perform Toileting - Clothing Manipulation and hygiene: with  supervision;sit to/from stand Pt Will Perform Tub/Shower  Transfer: with min guard assist;ambulating;rolling walker;3 in 1  OT Frequency: Min 2X/week    End of Session Equipment Utilized During Treatment: Gait belt;Rolling walker Nurse Communication: Other (comment) (Need for SNF placement for safety.)  Activity Tolerance: Patient tolerated treatment well Patient left: in chair;with call bell/phone within reach;with family/visitor present   Time: 0821-0925 OT Time Calculation (min): 64 min Charges:  OT Evaluation $OT Eval Moderate Complexity: 1 Procedure OT Treatments $Self Care/Home Management : 38-52 mins  Norman Herrlich, OTR/L [id number] 07/16/2016, 10:38 AM

## 2016-07-16 NOTE — Progress Notes (Signed)
PT Cancellation Note  Patient Details Name: Jeff Rivera MRN: [id number] DOB: Oct 17, 1940   Cancelled Treatment:    Reason Eval/Treat Not Completed: Other (comment) (Pt eating lunch will continue efforts.  )   Nyna Chilton Eli Hose 07/16/2016, 1:36 PM  Governor Rooks, PTA pager [phone]

## 2016-07-16 NOTE — Progress Notes (Signed)
Rept to Dr. Lyla Glassing regarding pt's WBC of 15.6 today. Pt has had no elevated temp or any other s/sx of infection. Rept to Dr. Lyla Glassing that pt had an episode of confusion on night shift in which pt was incontinent in the floor. Rept to Dr. Lyla Glassing that pt repts that he has a h/o confusion related to anesthesia and narcotics in the remote past. Dr. Lyla Glassing has ordered Tramadol for the pt's pain. Pt is alert and oriented x 3 at this time. His vital signs are stable. Pt does exhibit some s/sx of forgetfulness. MD order is to d/c to home today. Will continue to monitor.

## 2016-07-16 NOTE — Progress Notes (Signed)
   Subjective:  Patient reports pain as mild to moderate.  Denies N/V/CP/SOB. Had confusion overnight, wants weaker pain meds than norco.  Objective:   VITALS:   Vitals:   07/15/16 1530 07/15/16 2100 07/16/16 0241 07/16/16 0600  BP: (!) 111/53 130/60 (!) 123/59 129/62  Pulse: 88 (!) 110 97 86  Resp: 18 17 16 18   Temp: 97.8 F (36.6 C) 97.7 F (36.5 C) 98.2 F (36.8 C) 97.9 F (36.6 C)  TempSrc: Oral Oral Oral Oral  SpO2: 98% 98% 99% 99%  Weight:      Height:        NAD, A+O x3 ABD soft Sensation intact distally Intact pulses distally Dorsiflexion/Plantar flexion intact Compartment soft    Lab Results  Component Value Date   WBC 15.6 (H) 07/16/2016   HGB 10.2 (L) 07/16/2016   HCT 30.4 (L) 07/16/2016   MCV 96.8 07/16/2016   PLT 169 07/16/2016   BMET    Component Value Date/Time   NA 137 07/16/2016 0530   NA 135 (L) 11/04/2014 1545   K 4.7 07/16/2016 0530   K 5.6 (H) 11/04/2014 1545   CL 107 07/16/2016 0530   CL 102 11/04/2014 1545   CO2 23 07/16/2016 0530   CO2 28 11/04/2014 1545   GLUCOSE 126 (H) 07/16/2016 0530   GLUCOSE 136 (H) 11/04/2014 1545   BUN 24 (H) 07/16/2016 0530   BUN 30 (H) 11/04/2014 1545   CREATININE 1.59 (H) 07/16/2016 0530   CREATININE 1.38 (H) 07/20/2015 1146   CALCIUM 9.4 07/16/2016 0530   CALCIUM 8.6 11/04/2014 1545   GFRNONAA 41 (L) 07/16/2016 0530   GFRNONAA 48 (L) 11/04/2014 1545   GFRAA 47 (L) 07/16/2016 0530   GFRAA 59 (L) 11/04/2014 1545     Assessment/Plan: 2 Days Post-Op   Principal Problem:   Osteoarthritis of right knee  WBAT RLE with walker DVT ppx: ASA, SCDs, TEDs PT/OT PO pain control: change meds to tramadol Dispo: D/C home today, outpatient PT already set up, has DME at home, No HH needs   Oliveah Zwack, Horald Pollen 07/16/2016, 8:02 AM   Rod Can, MD Cell [phone]

## 2016-07-16 NOTE — Progress Notes (Signed)
Message left at Dr. Sid Falcon office that PT is recommending SNF for pt. Will continue to monitor.

## 2016-07-16 NOTE — Progress Notes (Addendum)
Physical Therapy Treatment Patient Details Name: Jeff Rivera MRN: [id number] DOB: [birth year] Today's Date: 07/16/2016    History of Present Illness Patient is a 75 y/o male with hx of left TKA, HTN, ED, CAD, CKD, aortic stenosis presents s/p Rt TKA.     PT Comments    Pt performed treatment but remains to require min assist with all mobility.  Pt will require skilled rehab at facility before returning home to improve strength and functional mobility.  Family agreeable and informed.  Will inform supervising PT of need for change in recommendations.     Follow Up Recommendations  SNF     Equipment Recommendations  None recommended by PT (TBD at next venue)    Recommendations for Other Services       Precautions / Restrictions Precautions Precautions: Knee Precaution Booklet Issued: No Precaution Comments: Reviewed no pillow under knee. Restrictions Weight Bearing Restrictions: Yes RLE Weight Bearing: Weight bearing as tolerated    Mobility  Bed Mobility Overal bed mobility: Needs Assistance Bed Mobility: Supine to Sit     Supine to sit: Min guard;HOB elevated (Increased time and VC's for safety)     General bed mobility comments: Sitting in chair upon PT arrival.   Transfers Overall transfer level: Needs assistance Equipment used: Rolling walker (2 wheeled) Transfers: Sit to/from Stand Sit to Stand: Min assist         General transfer comment: After 2 failed attempts unassisted pt then required min assist to boost into standing.  Pt required cues for hand/foot placement and forward weight shifting.   Ambulation/Gait Ambulation/Gait assistance: Min assist Ambulation Distance (Feet): 80 Feet Assistive device: Rolling walker (2 wheeled) Gait Pattern/deviations: Step-through pattern;Trunk flexed;Leaning posteriorly;Shuffle;Decreased stride length Gait velocity: decreased Gait velocity interpretation: Below normal speed for age/gender General Gait Details: Pt  required cues to increase stride length and to improve trunk control.  Pt with LOB posterior during turn to back to seated surface.     Stairs            Wheelchair Mobility    Modified Rankin (Stroke Patients Only)       Balance Overall balance assessment: Needs assistance Sitting-balance support: Feet supported;Single extremity supported Sitting balance-[name]: Fair Sitting balance - Comments: Able to reach out of base of support, but required min guard assist to prevent loss of balance when bending over.   Standing balance support: During functional activity;Bilateral upper extremity supported Standing balance-[name]: Poor Standing balance comment: LOb posterior                    Cognition Arousal/Alertness: Awake/alert Behavior During Therapy: WFL for tasks assessed/performed Overall Cognitive Status: Impaired/Different from baseline Area of Impairment: Orientation Orientation Level: Situation (Pt reports seeing bugs on the floor.  )   Memory: Decreased short-term memory     Awareness: Emergent   General Comments: Pt reports becoming agitated in the night and walking up and down the halls and in his room without his walker to "rearrange" the furniture and attempt to unplug various equipment in room. Pt with much decreased awareness of safety, situation, and needs. Pt also reporting seeing bugs on the floor during gait training.    Exercises Total Joint Exercises Ankle Circles/Pumps: AROM;Both;10 reps;Supine Quad Sets: AROM;10 reps;Supine;Right Heel Slides: Right;10 reps;Supine;AAROM Hip ABduction/ADduction: AROM;Right;10 reps;Supine Straight Leg Raises: AAROM;Right;10 reps;Supine Goniometric ROM: 80 degrees flexion in R knee.      General Comments        Pertinent  Vitals/Pain Pain Assessment: 0-10 Faces Pain Scale: Hurts even more Pain Location: R knee Pain Descriptors / Indicators: Aching;Burning;Sore Pain Intervention(s): Monitored  during session;Repositioned;Ice applied    Home Living Family/patient expects to be discharged to:: Private residence Living Arrangements: Spouse/significant other (Spouse requires assistance as well.) Available Help at Discharge: Family;Available PRN/intermittently Type of Home: House Home Access: Stairs to enter Entrance Stairs-Rails: Right Home Layout: One level Home Equipment: Walker - 2 wheels;Cane - single point;Bedside commode;Shower seat - built in;Shower seat Additional Comments: Pt reports that spouse requiers assistance at times for daily tasks and may be unable to assist him at home.    Prior Function Level of Independence: Independent      Comments: Manages everything at home. Reports that he has an aide who comes to his home to help with cooking. Walks his dog often.   PT Goals (current goals can now be found in the care plan section) Acute Rehab PT Goals Patient Stated Goal: to go home Potential to Achieve Goals: Fair Progress towards PT goals: Progressing toward goals    Frequency    7X/week      PT Plan Discharge plan needs to be updated    Co-evaluation             End of Session Equipment Utilized During Treatment: Gait belt Activity Tolerance: Patient tolerated treatment well Patient left: in chair;with call bell/phone within reach;with family/visitor present     Time: [id number] PT Time Calculation (min) (ACUTE ONLY): 24 min  Charges:  $Gait Training: 8-22 mins $Therapeutic Exercise: 8-22 mins                    G Codes:      Cristela Blue 31-Jul-2016, 10:29 AM  Governor Rooks, PTA pager [phone]

## 2016-07-16 NOTE — Progress Notes (Signed)
Dr. Lyla Glassing in to see pt. Pt currently is resting comfortably semi folers in bed with eyes closed. No s/sx distress. Family left approx 1700. Right after family left NT Deania Bell in room to put pt back in bed. Pt was standing in front of recliner and had been incontinent on floor. Pt put back to bed without incident and without fall.  Pt confused again. Camera on in room and bed alarm intact. All rept to Dr. Lyla Glassing including pt is unstable with PT and they are recommending SNF placement. Also repted to MD that pt's family states that he doesn't have 20 hour care at home and that the patient lives with his demented wife and the family is requesting SNF placement. Will rept to oncoming RN and will continue to monitor.

## 2016-07-17 LAB — CBC
HCT: 28 % — ABNORMAL LOW (ref 39.0–52.0)
Hemoglobin: 9.3 g/dL — ABNORMAL LOW (ref 13.0–17.0)
MCH: 32.3 pg (ref 26.0–34.0)
MCHC: 33.2 g/dL (ref 30.0–36.0)
MCV: 97.2 fL (ref 78.0–100.0)
PLATELETS: 150 10*3/uL (ref 150–400)
RBC: 2.88 MIL/uL — ABNORMAL LOW (ref 4.22–5.81)
RDW: 14.3 % (ref 11.5–15.5)
WBC: 9.3 10*3/uL (ref 4.0–10.5)

## 2016-07-17 NOTE — Consult Note (Signed)
[facility] CM Primary Care Navigator  07/17/2016  DANNER PAULDING [birth year] [phone]  Met with patient, wife (demented) and family member (son-Haylen and Celina) at the bedside to identify possible discharge needs.  Patient confirms Dr. Thressa Sheller from [facility] as his primary care provider.    Patient shared using [facility] to obtain medications. Family states he has no difficulty affording medicines and confirmed that patient was self managing his medications at home but concerned about safety and accuracy of his medication management.  According to son, patient drives self prior to this admission/ surgery but family will have to provide transportation to doctors' appointments when discharged. Patient was independent with self care before this surgery per son. Family is anticipating short term rehab for patient at this point. Awaiting  Leonides Grills, office case manager for discharge planning at this time.  Patient and family expressed understanding to call primary care provider's office for a post discharge follow-up appointment within a week or sooner if needs arise, once he gets home. Patient letter provided as a reminder.        For additional questions please contact:  Edwena Felty A. Bethzaida Boord, BSN, RN-BC [facility] PRIMARY CARE Navigator Cell: [phone]

## 2016-07-17 NOTE — Clinical Social Work Note (Signed)
Clinical Social Worker continuing to follow patient and family for support and discharge planning needs.  Per RNCM, patient MD feels that patient will thrive better at home with family.  Office CM to meet with patient family regarding available resources at home.  RNCM and CSW met with patient and family to update on discharge plans.  Patient and family agreeable.  Clinical Social Worker will sign off for now as social work intervention is no longer needed. Please consult [place] again if new need arises.  Barbette Or, [place]

## 2016-07-17 NOTE — Progress Notes (Signed)
Occupational Therapy Treatment Patient Details Name: Jeff Rivera MRN: [id number] DOB: 1940-11-06 Today's Date: 07/17/2016    History of present illness Patient is a 75 y/o male with hx of left TKA, HTN, ED, CAD, CKD, aortic stenosis presents s/p Rt TKA.    OT comments  Pt continues to be limited by significant pain and require increased time, assistance, and VC's for safety to complete ADLs. Currently, pt requires moderate assist with ADL transfers and min assist with LB ADLs.Pt reports weakness in bilateral UE's during ADL with RW.Pt's wife able to provide some assistance with LB dressing but unable to assist with ADL transfers and functional mobility. Completed education concerning fall prevention sitting during ADL as necessary, performing ADLs post-operatively, and benefits of use of AE for LB dressing. Pt reports understanding but would benefit from further reinforcement. OT continues to feel that pt would benefit from further rehabilitation services from SNF placement at this time. Discussed with pt, who refuses and reports preference for outpatient PT and D/C home with wife. Pt would benefit from further OT services while in acute care. Will continue to follow.   Follow Up Recommendations  SNF    Equipment Recommendations  Other (comment) (TBD by next venue of care; pt has 3-in-1 and shower chair)       Precautions / Restrictions Precautions Precautions: Knee Precaution Booklet Issued: No Precaution Comments: Reviewed no pillow under knee. Restrictions Weight Bearing Restrictions: Yes RLE Weight Bearing: Weight bearing as tolerated       Mobility Bed Mobility Overal bed mobility: Needs Assistance Bed Mobility: Supine to Sit;Sit to Supine     Supine to sit: Min guard;HOB elevated (Increased time) Sit to supine: Min assist   General bed mobility comments: VC's for sequencing, assist to bring R LE into bed, increased time.  Transfers Overall transfer level: Needs  assistance Equipment used: Rolling walker (2 wheeled) Transfers: Sit to/from Stand Sit to Stand: Min assist;Mod assist         General transfer comment: Pt reports knee is more "stiff" today and with increased pain. Required elevated surface to stand with mod assist to boost into standing as well as cues for hand placement.    Balance Overall balance assessment: Needs assistance Sitting-balance support: Feet supported;No upper extremity supported Sitting balance-[name]: Fair Sitting balance - Comments: Able to reach out of base of support, but required supervision to prevent loss of balance when bending over.   Standing balance support: During functional activity;Single extremity supported Standing balance-[name]: Poor                     ADL Overall ADL's : Needs assistance/impaired     Grooming: Minimal assistance;Standing Grooming Details (indicate cue type and reason): Min assist to maintain balance for safety during standing tasks.             Lower Body Dressing: Minimal assistance;Sit to/from stand Lower Body Dressing Details (indicate cue type and reason): Min assist for R leg threading and socks; decreased dynamic sitting balance and safety awareness while bending over. Decreased knowledge of limitations. Toilet Transfer: Ambulation;BSC;RW;Cueing for safety;Cueing for sequencing;Moderate assistance (Increased time) Toilet Transfer Details (indicate cue type and reason): Increased VC's for safety with RW and for hand placement.     Tub/ Shower Transfer: Moderate assistance;Cueing for safety;Cueing for sequencing;Ambulation;3 in 1;Rolling walker Tub/Shower Transfer Details (indicate cue type and reason): Pt with difficulty raising bilateral legs to step over small shower ledge. Functional mobility during ADLs:  Moderate assistance;Minimal assistance;Rolling walker General ADL Comments: Educated pt on safe use of RW, importance of not getting up from  chair without assistance, fall prevention, and ADLs post-operatively. Pt reports understanding but requires frequent VC's for reinforcement. Pt continues to be impulsive.                Cognition   Behavior During Therapy: WFL for tasks assessed/performed Overall Cognitive Status: Impaired/Different from baseline (Pt reports "mind problems" at baseline) Area of Impairment: Memory;Attention   Current Attention Level: Selective Memory: Decreased short-term memory      Awareness: Emergent   General Comments: Pt required frequent cueing to continue with tasks after initiation. Pt required increased time and has decreased awareness of limitations.    Extremity/Trunk Assessment                          Pertinent Vitals/ Pain       Pain Assessment: Faces Faces Pain Scale: Hurts whole lot Pain Location: R knee Pain Descriptors / Indicators: Grimacing;Operative site guarding;Tightness Pain Intervention(s): RN gave pain meds during session;Limited activity within patient's tolerance;Repositioned;Ice applied         Frequency  Min 2X/week        Progress Toward Goals  OT Goals(current goals can now be found in the care plan section)  Progress towards OT goals: Progressing toward goals (Slow progress)  Acute Rehab OT Goals Patient Stated Goal: to go home OT Goal Formulation: With patient/family Time For Goal Achievement: 07/30/16 Potential to Achieve Goals: Good ADL Goals Pt Will Perform Upper Body Bathing: Independently;sitting Pt Will Perform Lower Body Bathing: with supervision;sit to/from stand Pt Will Perform Upper Body Dressing: Independently;sitting Pt Will Perform Lower Body Dressing: with supervision;sit to/from stand Pt Will Transfer to Toilet: with min guard assist;bedside commode;ambulating Pt Will Perform Toileting - Clothing Manipulation and hygiene: with supervision;sit to/from stand Pt Will Perform Tub/Shower Transfer: with min guard  assist;ambulating;rolling walker;3 in 1  Plan Discharge plan remains appropriate       End of Session Equipment Utilized During Treatment: Gait belt;Rolling walker   Activity Tolerance Patient limited by pain   Patient Left in bed;with call bell/phone within reach;with bed alarm set     Time: [id number] OT Time Calculation (min): 51 min  Charges: OT General Charges $OT Visit: 1 Procedure OT Treatments $Self Care/Home Management : 38-52 mins  Norman Herrlich, OTR/L [phone] 07/17/2016, 10:09 AM

## 2016-07-17 NOTE — Care Management (Signed)
Case manager spoke with Dr. Lyla Glassing after patient's son-Destyn- expressed his concerns about his father's discharge plan. Torriano states he wants his dad to go to [facility]. CM contacted Dr. Lyla Glassing to update him on this request. Per Dr. Lyla Glassing, patient will do better at home with more assistance and supervison,and home was the preoperative plan. The  office Case manager Sharee Pimple will be coming to discuss this with them. CM explained this to Jenny Reichmann who states as long as his dad can have 24 hr care at home they will be of with discharge to home. Case manager will continue to monitor for disposition.

## 2016-07-17 NOTE — Care Management Important Message (Signed)
Important Message  Patient Details  Name: Jeff Rivera MRN: [id number] Date of Birth: 06-May-1941   Medicare Important Message Given:  Yes    [name] 07/17/2016, 12:09 PM

## 2016-07-17 NOTE — Progress Notes (Signed)
Physical Therapy Treatment Patient Details Name: Jeff Rivera MRN: [id number] DOB: 12-Apr-1941 Today's Date: 07/17/2016    History of Present Illness Patient is a 75 y/o male with hx of left TKA, HTN, ED, CAD, CKD, aortic stenosis presents s/p Rt TKA.     PT Comments    Pt performed decreased mobility with findings of low BP.  RN informed and aware.  Pt returned to supine after multiple attempts of sit to stand but unable to maintain standing before complaints of dizziness.  Continue to recommend SNF placement before returning home after patient is unable to stand this pm without LOB.  Pt requiring significant assistance which proves he is at a higher risk for falling if he returns home.    Follow Up Recommendations  SNF;Supervision/Assistance - 24 hour     Equipment Recommendations  None recommended by PT    Recommendations for Other Services       Precautions / Restrictions Precautions Precautions: Knee Precaution Comments: Reviewed no pillow under knee. Restrictions Weight Bearing Restrictions: Yes RLE Weight Bearing: Weight bearing as tolerated    Mobility  Bed Mobility Overal bed mobility: Needs Assistance Bed Mobility: Supine to Sit;Sit to Supine     Supine to sit: Mod assist;Max assist Sit to supine: Mod assist;+2 for physical assistance   General bed mobility comments: Pt required 15 min with mod assist to finally achieve sitting edge of bed and max assist to scoot to edge of bed with chux pad.  Pt required supervision this morning but presents lethragically and unable to get out of bed or to edge of bed without assistance.  Pt reports dizziness after multiple standing trials and then required +2 assist for back to bed transfer.  pt required significant assist in comparison to tx this am.    Transfers Overall transfer level: Needs assistance Equipment used: Rolling walker (2 wheeled) Transfers: Sit to/from Stand Sit to Stand: Mod assist;+2 physical assistance          General transfer comment: Pt unable to stand without assistance.  Pt required physical assist to boost into standing at edge of bed with strong posterior lean in standing.  Multiple LOB posterior.  Pt then reporting dizziness and BP obtained 87/42, 87/39 and 67/40.  After return to supine 105/49  RN aware and informed.  RN present during remaining BPs.    Ambulation/Gait Ambulation/Gait assistance:  (Unable pt performed multiple reps of standing and unable to achieve full standing with balance to progress to gait training.  )               Stairs            Wheelchair Mobility    Modified Rankin (Stroke Patients Only)       Balance Overall balance assessment: Needs assistance   Sitting balance-[name]: Poor       Standing balance-[name]: Zero Standing balance comment: LOB posterior                    Cognition Arousal/Alertness: Awake/alert Behavior During Therapy: WFL for tasks assessed/performed Overall Cognitive Status: Impaired/Different from baseline Area of Impairment: Memory;Attention Orientation Level: Situation Current Attention Level: Selective Memory: Decreased short-term memory     Awareness: Emergent   General Comments: Pt requiring increased time to follow commands and focus back on task.      Exercises      General Comments        Pertinent Vitals/Pain Pain Assessment: 0-10 Pain Score:  9  Pain Location: R knee Pain Descriptors / Indicators: Grimacing;Guarding;Tightness Pain Intervention(s): Monitored during session;Repositioned    Home Living                      Prior Function            PT Goals (current goals can now be found in the care plan section) Acute Rehab PT Goals Patient Stated Goal: to get pain medicine Potential to Achieve Goals: Fair Progress towards PT goals: Progressing toward goals    Frequency    7X/week      PT Plan Current plan remains appropriate     Co-evaluation             End of Session Equipment Utilized During Treatment: Gait belt Activity Tolerance: Patient tolerated treatment well Patient left: with call bell/phone within reach;with family/visitor present;with restraints reapplied     Time: 1445-1533 PT Time Calculation (min) (ACUTE ONLY): 48 min  Charges:  $Therapeutic Activity: 38-52 mins                    G Codes:      Cristela Blue 07-21-2016, 3:43 PM  Governor Rooks, PTA pager [phone]

## 2016-07-17 NOTE — Progress Notes (Signed)
   Subjective:  Patient reports pain as mild to moderate.  Denies N/V/CP/SOB. Slow progress with PT yesterday.  Objective:   VITALS:   Vitals:   07/16/16 0600 07/16/16 1300 07/16/16 2157 07/17/16 0557  BP: 129/62 118/64 (!) 115/53 121/68  Pulse: 86 77 (!) 111 (!) 101  Resp: 18 18 18 16   Temp: 97.9 F (36.6 C) 97.1 F (36.2 C) 98.8 F (37.1 C) 98.4 F (36.9 C)  TempSrc: Oral Oral Oral Oral  SpO2: 99% 99% 96% 96%  Weight:      Height:        NAD, A+O x3, no AMS ABD soft Sensation intact distally Intact pulses distally Dorsiflexion/Plantar flexion intact Compartment soft    Lab Results  Component Value Date   WBC 9.3 07/17/2016   HGB 9.3 (L) 07/17/2016   HCT 28.0 (L) 07/17/2016   MCV 97.2 07/17/2016   PLT 150 07/17/2016   BMET    Component Value Date/Time   NA 137 07/16/2016 0530   NA 135 (L) 11/04/2014 1545   K 4.7 07/16/2016 0530   K 5.6 (H) 11/04/2014 1545   CL 107 07/16/2016 0530   CL 102 11/04/2014 1545   CO2 23 07/16/2016 0530   CO2 28 11/04/2014 1545   GLUCOSE 126 (H) 07/16/2016 0530   GLUCOSE 136 (H) 11/04/2014 1545   BUN 24 (H) 07/16/2016 0530   BUN 30 (H) 11/04/2014 1545   CREATININE 1.59 (H) 07/16/2016 0530   CREATININE 1.38 (H) 07/20/2015 1146   CALCIUM 9.4 07/16/2016 0530   CALCIUM 8.6 11/04/2014 1545   GFRNONAA 41 (L) 07/16/2016 0530   GFRNONAA 48 (L) 11/04/2014 1545   GFRAA 47 (L) 07/16/2016 0530   GFRAA 59 (L) 11/04/2014 1545     Assessment/Plan: 3 Days Post-Op   Principal Problem:   Osteoarthritis of right knee  WBAT RLE with walker DVT ppx: ASA, SCDs, TEDs PT/OT PO pain control: tramadol, tylenol PRN Dispo: Patient does NOT require SNF. D/C home today, Leonides Grills RN case manager to come today to help with d/c plan, outpatient PT already set up, has DME at home, No HH needs   Jeff Rivera, Jeff Rivera 07/17/2016, 8:04 AM   Rod Can, MD Cell [phone]

## 2016-07-17 NOTE — Progress Notes (Signed)
Physical Therapy Treatment Patient Details Name: Jeff Rivera MRN: [id number] DOB: 1941-08-27 Today's Date: 07/17/2016    History of Present Illness Patient is a 75 y/o male with hx of left TKA, HTN, ED, CAD, CKD, aortic stenosis presents s/p Rt TKA.     PT Comments    Pt remains to require min to min guard assist and unable to perform functional mobility without assist.  Pt lives with elderly wife who is unable to provide assistance at this time.  Pt proves to be a high fall risk if he returns home.  Recommendations remain for SNF placement to improve strength and functional mobility before returning home.     Follow Up Recommendations  SNF;Supervision/Assistance - 24 hour     Equipment Recommendations  None recommended by PT    Recommendations for Other Services       Precautions / Restrictions Precautions Precautions: Knee Precaution Booklet Issued: No Precaution Comments: Reviewed no pillow under knee. Restrictions Weight Bearing Restrictions: Yes RLE Weight Bearing: Weight bearing as tolerated    Mobility  Bed Mobility Overal bed mobility: Needs Assistance Bed Mobility: Supine to Sit;Sit to Supine     Supine to sit: Supervision Sit to supine: Supervision   General bed mobility comments: Pt required step by step instruction with increases time.  Pt able to advance LEs into and out of bed with max VCs for technique.  Pt required max VCs for scooting in bed to position.    Transfers Overall transfer level: Needs assistance Equipment used: Rolling walker (2 wheeled) Transfers: Sit to/from Stand Sit to Stand: Min assist         General transfer comment: PTA allowed patient to attempt to stand x3 and with each attempt patient presents with posterior LOB returning to seated position.  Pt require min assist to boost into standing.  During stand to sit pt presents with poor hand placement pulling RW on hi,mslef despite cues for hand placement to reach back for seated  surface.    Ambulation/Gait Ambulation/Gait assistance: Min guard;Min assist Ambulation Distance (Feet): 80 Feet Assistive device: Rolling walker (2 wheeled) Gait Pattern/deviations: Step-to pattern;Shuffle;Trunk flexed;Decreased stride length Gait velocity: decreased Gait velocity interpretation: Below normal speed for age/gender General Gait Details: Pt remains to require Max VCs to improve safety.  pt presents with poor safety in regards to RW position with turns.  Poor foot clearance with BLEs.  Pt requires cues for sequencing with each step to improve quality.     Stairs            Wheelchair Mobility    Modified Rankin (Stroke Patients Only)       Balance Overall balance assessment: Needs assistance Sitting-balance support: Feet supported;No upper extremity supported Sitting balance-[name]: Fair Sitting balance - Comments: Able to reach out of base of support, but required supervision to prevent loss of balance when bending over.   Standing balance support: During functional activity;Single extremity supported Standing balance-[name]: Poor Standing balance comment: LOB posterior                    Cognition Arousal/Alertness: Awake/alert Behavior During Therapy: WFL for tasks assessed/performed Overall Cognitive Status: Impaired/Different from baseline Area of Impairment: Memory;Attention   Current Attention Level: Selective Memory: Decreased short-term memory     Awareness: Emergent   General Comments: Pt remains to require max VCs to complete tasks safely.  pt uses humor to mask ability to remember commands.     Exercises Total  Joint Exercises Ankle Circles/Pumps: AROM;Both;10 reps;Supine Quad Sets: AROM;Right;10 reps;Supine Gluteal Sets: Both;10 reps;Seated Heel Slides: Right;10 reps;Supine;AAROM Hip ABduction/ADduction: AROM;Right;10 reps;Supine Goniometric ROM: 80 degrees flexion in R knee    General Comments         Pertinent Vitals/Pain Pain Assessment: Faces Faces Pain Scale: Hurts even more Pain Location: R knee Pain Descriptors / Indicators: Grimacing;Guarding;Operative site guarding Pain Intervention(s): Monitored during session;Repositioned    Home Living                      Prior Function            PT Goals (current goals can now be found in the care plan section) Acute Rehab PT Goals Patient Stated Goal: to go home and get his dog from the vet Potential to Achieve Goals: Fair Progress towards PT goals: Progressing toward goals    Frequency           PT Plan Current plan remains appropriate    Co-evaluation             End of Session Equipment Utilized During Treatment: Gait belt Activity Tolerance: Patient tolerated treatment well Patient left: in chair;with call bell/phone within reach;with family/visitor present     Time: [id number] PT Time Calculation (min) (ACUTE ONLY): 34 min  Charges:  $Gait Training: 8-22 mins $Therapeutic Activity: 8-22 mins                    G Codes:      Cristela Blue 2016-07-29, 11:10 AM  Governor Rooks, PTA pager [phone]

## 2016-07-18 NOTE — Progress Notes (Signed)
Occupational Therapy Treatment Patient Details Name: Jeff Rivera MRN: [id number] DOB: 1941/08/27 Today's Date: 07/18/2016    History of present illness Patient is a 75 y/o male with hx of left TKA, HTN, ED, CAD, CKD, aortic stenosis presents s/p Rt TKA.    OT comments  Pt extremely limited by pain and decreased mobility this session. Pt currently requires max assist for toilet transfer and mod assist for functional ambulation with much increased time and Vc's. Pt required approximately 10 minutes to ambulate from bed to chair (approximately 5 feet) for simulated toilet transfer with continuous VC's for continuation of task. Continued education with pt and family concerning safety during functional mobility and ADL, fall prevention, and knee precautions. They verbalize understanding but require reinforcement. Pt's family reports that his cognitive level as session progressed is declined from this am. Pt with much decreased balance, activity tolerance, and strength and is at great risk of falling during functional activity. Continue to recommend SNF placement for continued rehabilitation services post-acute D/C to improve independence with ADL. OT will continue to follow acutely. Plan to address B UE strengthening to improve activity tolerance and strength to use RW with ADLs.  Follow Up Recommendations  SNF    Equipment Recommendations  Other (comment) (TBD at next venue of care.)       Precautions / Restrictions Precautions Precautions: Knee Precaution Booklet Issued: No Precaution Comments: Reviewed no pillow under knee. Restrictions Weight Bearing Restrictions: Yes RLE Weight Bearing: Weight bearing as tolerated       Mobility Bed Mobility Overal bed mobility: Needs Assistance Bed Mobility: Supine to Sit     Supine to sit: Mod assist;HOB elevated     General bed mobility comments: Pt  reuqired 10 minutes to move to sit EOB, pt required VC's for continuation of activity and to  maintain attention to task.  Transfers Overall transfer level: Needs assistance Equipment used: Rolling walker (2 wheeled) Transfers: Sit to/from Stand Sit to Stand: Max assist;From elevated surface         General transfer comment: Pt unable to stand without assistance. Required multiple attempts to stand. Was unable to stand with boost on first attempt, but able to with max assist for boost on second. Required consistent VC's for safe hand placement and sequencing.    Balance Overall balance assessment: Needs assistance Sitting-balance support: Feet supported;Bilateral upper extremity supported Sitting balance-[name]: Poor Sitting balance - Comments: Lateral lean to left. Postural control: Left lateral lean Standing balance support: Bilateral upper extremity supported;During functional activity Standing balance-[name]: Poor Standing balance comment: Required mod assist while standing to maintain balance.                   ADL Overall ADL's : Needs assistance/impaired Eating/Feeding: Supervision/ safety;Set up;Sitting                       Toilet Transfer: Moderate assistance;Ambulation;RW;Maximal assistance (Simulated with short distance from bed to recliner (~5 feet)) Toilet Transfer Details (indicate cue type and reason): Max assist sit<>stand, mod during ambulation. VC's for maintaining hands on walker and reminding pt of task being completed. Increased VC's for safety with RW and for hand placement.         Functional mobility during ADLs: Moderate assistance;Rolling walker;Maximal assistance General ADL Comments: Pt requiring increased assistance this date with all functional mobility and simulated toilet transfer. Required VC's to move feet and maintain hands properly on RW. Pt and family educated on  use of ice for pain management, fall prevention, and benefits of rehabilitation placement.                Cognition   Behavior During  Therapy: WFL for tasks assessed/performed Overall Cognitive Status: Impaired/Different from baseline Area of Impairment: Memory;Attention Orientation Level: Situation Current Attention Level: Selective Memory: Decreased short-term memory      Awareness: Emergent   General Comments: Pt required much increased time to ambulate from bed to chair for simulated toilet transfer. Pt required constand VC's to remind him which task we were completing. Pt's family reports that he has been coherent this am but when we began functional mobility his cognitive status began to decline.                 Pertinent Vitals/ Pain       Pain Assessment: Faces Faces Pain Scale: Hurts even more Pain Location: R knee Pain Descriptors / Indicators: Aching;Constant;Operative site guarding;Tightness;Squeezing Pain Intervention(s): Limited activity within patient's tolerance;Repositioned;Ice applied         Frequency  Min 2X/week        Progress Toward Goals  OT Goals(current goals can now be found in the care plan section)  Progress towards OT goals: Progressing toward goals (Slow progress)  Acute Rehab OT Goals Patient Stated Goal: to be able to stand on my own OT Goal Formulation: With patient/family Time For Goal Achievement: 07/30/16 Potential to Achieve Goals: Fair ADL Goals Pt Will Perform Upper Body Bathing: Independently;sitting Pt Will Perform Lower Body Bathing: with supervision;sit to/from stand Pt Will Perform Upper Body Dressing: Independently;sitting Pt Will Perform Lower Body Dressing: with supervision;sit to/from stand Pt Will Transfer to Toilet: with min guard assist;bedside commode;ambulating Pt Will Perform Toileting - Clothing Manipulation and hygiene: with supervision;sit to/from stand Pt Will Perform Tub/Shower Transfer: with min guard assist;ambulating;rolling walker;3 in 1  Plan Discharge plan remains appropriate       End of Session Equipment Utilized During  Treatment: Gait belt;Rolling walker   Activity Tolerance Patient limited by pain   Patient Left in chair;with call bell/phone within reach;with family/visitor present   Nurse Communication          Time: 1125-1207 OT Time Calculation (min): 42 min  Charges: OT General Charges $OT Visit: 1 Procedure OT Treatments $Self Care/Home Management : 38-52 mins  Norman Herrlich, OTR/L [phone] 07/18/2016, 12:24 PM

## 2016-07-18 NOTE — Progress Notes (Signed)
   Subjective:  Patient reports pain as mild to moderate.  Denies N/V/CP/SOB. Slow progress with PT.  Objective:   VITALS:   Vitals:   07/17/16 0557 07/17/16 1400 07/17/16 2000 07/18/16 0428  BP: 121/68 (!) 105/48 (!) 90/52 115/66  Pulse: (!) 101 (!) 107 (!) 104 94  Resp: 16  16 16   Temp: 98.4 F (36.9 C)  98.4 F (36.9 C) 98.8 F (37.1 C)  TempSrc: Oral  Oral Oral  SpO2: 96%  97% 95%  Weight:      Height:        NAD, A+O x3, no AMS ABD soft Sensation intact distally Intact pulses distally Dorsiflexion/Plantar flexion intact Compartment soft    Lab Results  Component Value Date   WBC 9.3 07/17/2016   HGB 9.3 (L) 07/17/2016   HCT 28.0 (L) 07/17/2016   MCV 97.2 07/17/2016   PLT 150 07/17/2016   BMET    Component Value Date/Time   NA 137 07/16/2016 0530   NA 135 (L) 11/04/2014 1545   K 4.7 07/16/2016 0530   K 5.6 (H) 11/04/2014 1545   CL 107 07/16/2016 0530   CL 102 11/04/2014 1545   CO2 23 07/16/2016 0530   CO2 28 11/04/2014 1545   GLUCOSE 126 (H) 07/16/2016 0530   GLUCOSE 136 (H) 11/04/2014 1545   BUN 24 (H) 07/16/2016 0530   BUN 30 (H) 11/04/2014 1545   CREATININE 1.59 (H) 07/16/2016 0530   CREATININE 1.38 (H) 07/20/2015 1146   CALCIUM 9.4 07/16/2016 0530   CALCIUM 8.6 11/04/2014 1545   GFRNONAA 41 (L) 07/16/2016 0530   GFRNONAA 48 (L) 11/04/2014 1545   GFRAA 47 (L) 07/16/2016 0530   GFRAA 59 (L) 11/04/2014 1545     Assessment/Plan: 4 Days Post-Op   Principal Problem:   Osteoarthritis of right knee  WBAT RLE with walker DVT ppx: ASA, SCDs, TEDs PT/OT PO pain control: tramadol, tylenol PRN Dispo: D/C to home vs [facility] today depending on progress with PT   Hessie Varone, Horald Pollen  07/18/2016, 8:45 AM   Rod Can, MD Cell [phone]

## 2016-07-18 NOTE — Clinical Social Work Note (Signed)
Clinical Social Worker received notification from [facility] that MD is now agreeable to patient discharge to SNF [facility]).  CSW has communicated with [facility] and they have a private room for patient available 10/7.  RNCM updated MD who is agreeable and will have discharge summary ready.  CSW left a handoff for weekend CSW to facilitate patient discharge.  Unsure at this time patient plan for transportation.  Please contact [facility] ([place] [phone]) to facilitate patient discharge needs.  CSW remains available for support and to facilitate patient discharge needs.  Barbette Or, [place]

## 2016-07-18 NOTE — Progress Notes (Signed)
Physical Therapy Treatment Patient Details Name: Jeff Rivera MRN: [id number] DOB: Jul 23, 1941 Today's Date: 07/18/2016    History of Present Illness Patient is a 75 y/o male with hx of left TKA, HTN, ED, CAD, CKD, aortic stenosis presents s/p Rt TKA.     PT Comments    Pt performed increased mobility but remains to require min to mod assist for functional mobility.  Pt remains a high risk to fall without adequate support at home.  Recommendations remain for SNF placement to ensure strength and mobility improves before returning home.  Will f/u this pm to address function if patient remains hospitalized.       Follow Up Recommendations  SNF;Supervision/Assistance - 24 hour     Equipment Recommendations  None recommended by PT    Recommendations for Other Services       Precautions / Restrictions Precautions Precautions: Knee Precaution Booklet Issued: No Precaution Comments: Reviewed no pillow under knee. Restrictions Weight Bearing Restrictions: Yes RLE Weight Bearing: Weight bearing as tolerated    Mobility  Bed Mobility Overal bed mobility: Needs Assistance Bed Mobility: Supine to Sit     Supine to sit: Mod assist;HOB elevated     General bed mobility comments: Pt received in recliner require increased time to scoot edge of recliner in prep for transfer activities.  Pt rocking back and forth not moving due to difficulty with cognition to process commands.    Transfers Overall transfer level: Needs assistance Equipment used: Rolling walker (2 wheeled) Transfers: Sit to/from Stand Sit to Stand: Mod assist         General transfer comment: Pt required  increased time to ascend from recliner chair.  PTA allowed patient to perform without assist but unable to clear his bottom from the chair.  pt required mod assist to boost from seated surface and maintain balance.  Posterior lean noted and LOB during transfer.  Mod assist to succesfully complete.     Ambulation/Gait Ambulation/Gait assistance: Mod assist;Min assist (assist level varried from mod -min.  ) Ambulation Distance (Feet): 160 Feet Assistive device: Rolling walker (2 wheeled) Gait Pattern/deviations: Step-through pattern;Step-to pattern;Decreased stride length;Shuffle;Trunk flexed Gait velocity: decreased   General Gait Details: Pt required mod assist to facilitate weight shifting to improve foot clearance and stride lengths.  Pt able to progress to bouts of gait in which he requires min assist and then he stops and lean on RW with elbow and requires mod assist to initiate gait again.  Pt focuses on pain and requires max VCs to focus on task to improve mobility. Pt remains a high risk to fall without adequate assistance.     Stairs Stairs:  (remains unable and will require PTAR for transfer home if he does not go to SNF.  )          Wheelchair Mobility    Modified Rankin (Stroke Patients Only)       Balance Overall balance assessment: Needs assistance Sitting-balance support: Feet supported;Bilateral upper extremity supported Sitting balance-[name]: Poor Sitting balance - Comments: Lateral lean to left. Postural control: Left lateral lean Standing balance support: Bilateral upper extremity supported;During functional activity Standing balance-[name]: Poor Standing balance comment: Required mod assist while standing to maintain balance.                    Cognition Arousal/Alertness: Awake/alert Behavior During Therapy: WFL for tasks assessed/performed Overall Cognitive Status: Impaired/Different from baseline Area of Impairment: Memory;Attention Orientation Level: Situation Current Attention Level:  Selective Memory: Decreased short-term memory     Awareness: Emergent   General Comments: Pt remains to require increased time and verbal cueing to complete functional mobility.      Exercises Total Joint Exercises Ankle Circles/Pumps:  AROM;Both;10 reps;Supine Quad Sets: AROM;Right;10 reps;Supine Gluteal Sets: Both;10 reps;Seated Heel Slides: Right;10 reps;Supine;AAROM Hip ABduction/ADduction: AROM;Right;10 reps;Supine Straight Leg Raises: AAROM;Right;10 reps;Supine Long Arc Quad: Right;10 reps;Seated    General Comments        Pertinent Vitals/Pain Pain Assessment: 0-10 Pain Score: 6  Faces Pain Scale: Hurts even more Pain Location: R knee Pain Descriptors / Indicators: Aching;Constant;Operative site guarding;Tightness;Squeezing Pain Intervention(s): Monitored during session    Home Living                      Prior Function            PT Goals (current goals can now be found in the care plan section) Acute Rehab PT Goals Patient Stated Goal: to be able to stand on my own Potential to Achieve Goals: Fair Progress towards PT goals: Progressing toward goals    Frequency    7X/week      PT Plan Current plan remains appropriate    Co-evaluation             End of Session Equipment Utilized During Treatment: Gait belt Activity Tolerance: Patient tolerated treatment well Patient left: with call bell/phone within reach;with family/visitor present;with restraints reapplied     Time: [id number] PT Time Calculation (min) (ACUTE ONLY): 35 min  Charges:  $Gait Training: 8-22 mins $Therapeutic Exercise: 8-22 mins                    G Codes:      Cristela Blue 2016/07/20, 1:41 PM  Governor Rooks, PTA pager [phone]

## 2016-07-18 NOTE — Progress Notes (Signed)
Physical Therapy Treatment Patient Details Name: Jeff Rivera MRN: [id number] DOB: 1941-06-05 Today's Date: 07/18/2016    History of Present Illness Patient is a 75 y/o male with hx of left TKA, HTN, ED, CAD, CKD, aortic stenosis presents s/p Rt TKA.     PT Comments    Pt performed decreased mobility and required +2 assist in pm treatment.  Pt presents with multiple LOB and unable to progress treatment.  Family present and observed decline.  Pt remains a high fall risk and recommendation remainz for SNF placement to improve strength and promote functional mobility before returning home.    Follow Up Recommendations  SNF;Supervision/Assistance - 24 hour     Equipment Recommendations  None recommended by PT    Recommendations for Other Services       Precautions / Restrictions Precautions Precautions: Knee Precaution Booklet Issued: No Precaution Comments: Reviewed no pillow under knee. Restrictions Weight Bearing Restrictions: Yes RLE Weight Bearing: Weight bearing as tolerated    Mobility  Bed Mobility Overal bed mobility: Needs Assistance Bed Mobility: Supine to Sit       Sit to supine: Max assist (required increased assist for back to bed.  )   General bed mobility comments: Pt received in chair and required 10 min to scoot to edge of chair to prepare for transfer.  Pt required max assist to assist back to bed.  Bed then placed in supine and patient able to perform with use of rail to scoot to [facility].   Transfers Overall transfer level: Needs assistance Equipment used: Rolling walker (2 wheeled) Transfers: Sit to/from Stand Sit to Stand: Mod assist;+2 physical assistance         General transfer comment: Pt initially required mod assist to ascend from recliner x2.  Pt presents with posterior LOB with each transfer and requires mod assist to maintain.  Pt performed stand pivot with max assist +1 from chair to [facility].  Pt began to sit before completing transfer and  required max assist to facilitate alignment of hips to sit on potty chair.  Pt then performed 2 reps of standing from [facility] with mod assist.  From [facility] pt began to ambulate before becoming fatigue and then required max assist to sit at foot of bed.  Pt then required mod assist +2 to stand and sidestep to prepare to return to [facility].    Ambulation/Gait Ambulation/Gait assistance: Max assist;Total assist Ambulation Distance (Feet): 8 Feet Assistive device: Rolling walker (2 wheeled) Gait Pattern/deviations: Step-to pattern;Antalgic;Trunk flexed;Decreased stride length Gait velocity: decreased Gait velocity interpretation: Below normal speed for age/gender General Gait Details: Pt shuffling with poor foot clearance and forward flexion of upper trunk.  Pt despite cues begins to reach for rail on bed and required total assist to turn patient toward bed to sit on foot of bed.     Stairs Stairs:  (remains unable and will require PTAR for transfer home if he does not go to SNF.  )          Wheelchair Mobility    Modified Rankin (Stroke Patients Only)       Balance Overall balance assessment: Needs assistance   Sitting balance-[name]: Poor Sitting balance - Comments: Lateral lean to left.     Standing balance-[name]: Poor                      Cognition Arousal/Alertness: Awake/alert Behavior During Therapy: WFL for tasks assessed/performed Overall Cognitive Status: Within Functional Limits  for tasks assessed Area of Impairment: Memory;Attention Orientation Level: Situation Current Attention Level: Selective Memory: Decreased short-term memory     Awareness: Emergent   General Comments: Pt remains to require increased time and verbal cueing to complete functional mobility.      Exercises    General Comments        Pertinent Vitals/Pain Pain Assessment: 0-10 Pain Score: 7  Pain Location: R knee Pain Descriptors / Indicators: Aching;Grimacing;Guarding Pain  Intervention(s): Monitored during session;Repositioned    Home Living                      Prior Function            PT Goals (current goals can now be found in the care plan section) Acute Rehab PT Goals Patient Stated Goal: to lay down.   Potential to Achieve Goals: Fair Progress towards PT goals: Not progressing toward goals - comment (pt declines in the afternoon and requires increased assist and becomes physically unable to ambulate.  )    Frequency    7X/week      PT Plan Current plan remains appropriate    Co-evaluation             End of Session Equipment Utilized During Treatment: Gait belt Activity Tolerance: Patient limited by fatigue;Patient limited by lethargy Patient left: in bed;with call bell/phone within reach;with family/visitor present     Time: [id number] PT Time Calculation (min) (ACUTE ONLY): 35 min  Charges:  $Therapeutic Activity: 23-37 mins                    G Codes:      Cristela Blue 2016/07/30, 4:50 PM  Governor Rooks, PTA pager [phone]

## 2016-07-18 NOTE — Clinical Social Work Placement (Signed)
   CLINICAL SOCIAL WORK PLACEMENT  NOTE  Date:  07/18/2016  Patient Details  Name: Jeff Rivera MRN: [id number] Date of Birth: 25-Sep-1941  Clinical Social Work is seeking post-discharge placement for this patient at the [place] level of care (*CSW will initial, date and re-position this form in  chart as items are completed):  Yes   Patient/family provided with [place] Work Department's list of facilities offering this level of care within the geographic area requested by the patient (or if unable, by the patient's family).  Yes   Patient/family informed of their freedom to choose among providers that offer the needed level of care, that participate in Medicare, Medicaid or managed care program needed by the patient, have an available bed and are willing to accept the patient.  Yes   Patient/family informed of [facility]'s ownership interest in [facility] and [facility], as well as of the fact that they are under no obligation to receive care at these facilities.  PASRR submitted to EDS on 07/16/16     PASRR number received on 07/16/16     Existing PASRR number confirmed on       FL2 transmitted to all facilities in geographic area requested by pt/family on 07/16/16     FL2 transmitted to all facilities within larger geographic area on       Patient informed that his/her managed care company has contracts with or will negotiate with certain facilities, including the following:        Yes   Patient/family informed of bed offers received.  Patient chooses bed at [facility] [place] recommends and patient chooses bed at      Patient to be transferred to [facility] [place] on 07/19/16.  Patient to be transferred to facility by Ambulance     Patient family notified on   of transfer.  Name of family member notified:        PHYSICIAN Please prepare priority discharge summary, including medications      Additional Comment:   Barbette Or, [place]

## 2016-07-19 DIAGNOSIS — Z96651 Presence of right artificial knee joint: Secondary | ICD-10-CM | POA: Diagnosis not present

## 2016-07-19 DIAGNOSIS — Z9842 Cataract extraction status, left eye: Secondary | ICD-10-CM | POA: Diagnosis not present

## 2016-07-19 DIAGNOSIS — M1711 Unilateral primary osteoarthritis, right knee: Secondary | ICD-10-CM | POA: Diagnosis not present

## 2016-07-19 DIAGNOSIS — Z96653 Presence of artificial knee joint, bilateral: Secondary | ICD-10-CM | POA: Diagnosis present

## 2016-07-19 DIAGNOSIS — G9340 Encephalopathy, unspecified: Secondary | ICD-10-CM | POA: Diagnosis not present

## 2016-07-19 DIAGNOSIS — K219 Gastro-esophageal reflux disease without esophagitis: Secondary | ICD-10-CM | POA: Diagnosis present

## 2016-07-19 DIAGNOSIS — I129 Hypertensive chronic kidney disease with stage 1 through stage 4 chronic kidney disease, or unspecified chronic kidney disease: Secondary | ICD-10-CM | POA: Diagnosis present

## 2016-07-19 DIAGNOSIS — Z96652 Presence of left artificial knee joint: Secondary | ICD-10-CM | POA: Diagnosis not present

## 2016-07-19 DIAGNOSIS — I350 Nonrheumatic aortic (valve) stenosis: Secondary | ICD-10-CM | POA: Diagnosis not present

## 2016-07-19 DIAGNOSIS — I2510 Atherosclerotic heart disease of native coronary artery without angina pectoris: Secondary | ICD-10-CM | POA: Diagnosis not present

## 2016-07-19 DIAGNOSIS — E785 Hyperlipidemia, unspecified: Secondary | ICD-10-CM | POA: Diagnosis present

## 2016-07-19 DIAGNOSIS — Z8249 Family history of ischemic heart disease and other diseases of the circulatory system: Secondary | ICD-10-CM | POA: Diagnosis not present

## 2016-07-19 DIAGNOSIS — I959 Hypotension, unspecified: Secondary | ICD-10-CM | POA: Diagnosis present

## 2016-07-19 DIAGNOSIS — E871 Hypo-osmolality and hyponatremia: Secondary | ICD-10-CM | POA: Diagnosis present

## 2016-07-19 DIAGNOSIS — Z9841 Cataract extraction status, right eye: Secondary | ICD-10-CM | POA: Diagnosis not present

## 2016-07-19 DIAGNOSIS — E860 Dehydration: Secondary | ICD-10-CM | POA: Diagnosis present

## 2016-07-19 DIAGNOSIS — R4182 Altered mental status, unspecified: Secondary | ICD-10-CM | POA: Diagnosis not present

## 2016-07-19 DIAGNOSIS — Z87442 Personal history of urinary calculi: Secondary | ICD-10-CM | POA: Diagnosis not present

## 2016-07-19 DIAGNOSIS — N183 Chronic kidney disease, stage 3 (moderate): Secondary | ICD-10-CM | POA: Diagnosis present

## 2016-07-19 DIAGNOSIS — R531 Weakness: Secondary | ICD-10-CM | POA: Diagnosis not present

## 2016-07-19 DIAGNOSIS — Z791 Long term (current) use of non-steroidal anti-inflammatories (NSAID): Secondary | ICD-10-CM | POA: Diagnosis not present

## 2016-07-19 DIAGNOSIS — Z961 Presence of intraocular lens: Secondary | ICD-10-CM | POA: Diagnosis present

## 2016-07-19 DIAGNOSIS — Z471 Aftercare following joint replacement surgery: Secondary | ICD-10-CM | POA: Diagnosis not present

## 2016-07-19 DIAGNOSIS — R031 Nonspecific low blood-pressure reading: Secondary | ICD-10-CM | POA: Diagnosis not present

## 2016-07-19 DIAGNOSIS — M25569 Pain in unspecified knee: Secondary | ICD-10-CM | POA: Diagnosis not present

## 2016-07-19 DIAGNOSIS — Z87891 Personal history of nicotine dependence: Secondary | ICD-10-CM | POA: Diagnosis not present

## 2016-07-19 DIAGNOSIS — M1990 Unspecified osteoarthritis, unspecified site: Secondary | ICD-10-CM | POA: Diagnosis not present

## 2016-07-19 DIAGNOSIS — D649 Anemia, unspecified: Secondary | ICD-10-CM | POA: Diagnosis present

## 2016-07-19 DIAGNOSIS — K5900 Constipation, unspecified: Secondary | ICD-10-CM | POA: Diagnosis not present

## 2016-07-19 DIAGNOSIS — Z7982 Long term (current) use of aspirin: Secondary | ICD-10-CM | POA: Diagnosis not present

## 2016-07-19 DIAGNOSIS — N19 Unspecified kidney failure: Secondary | ICD-10-CM | POA: Diagnosis not present

## 2016-07-19 DIAGNOSIS — I10 Essential (primary) hypertension: Secondary | ICD-10-CM | POA: Diagnosis not present

## 2016-07-19 DIAGNOSIS — N189 Chronic kidney disease, unspecified: Secondary | ICD-10-CM | POA: Diagnosis not present

## 2016-07-19 DIAGNOSIS — J9811 Atelectasis: Secondary | ICD-10-CM | POA: Diagnosis not present

## 2016-07-19 DIAGNOSIS — N179 Acute kidney failure, unspecified: Secondary | ICD-10-CM | POA: Diagnosis not present

## 2016-07-19 NOTE — Progress Notes (Signed)
Subjective: 5 Days Post-Op Procedure(s) (LRB): COMPUTER NAVIGATION ASSISTED TOTAL KNEE ARTHROPLASTY RIGHT (Right) Patient reports pain as well controlled.  Progressing with PT. Tolerating PO's. Positive flatus. Denies SOB, Calf pain, CP.  Objective: Vital signs in last 24 hours: Temp:  [97.9 F (36.6 C)-99.2 F (37.3 C)] 99.2 F (37.3 C) (10/07 0427) Pulse Rate:  [90-99] 92 (10/07 0427) Resp:  [16] 16 (10/07 0427) BP: (108-111)/(55-64) 111/64 (10/07 0427) SpO2:  [95 %-96 %] 95 % (10/07 0427)  Intake/Output from previous day: 10/06 0701 - 10/07 0700 In: 480 [P.O.:480] Out: 1150 [Urine:1150] Intake/Output this shift: No intake/output data recorded.   Recent Labs  07/17/16 0259  HGB 9.3*    Recent Labs  07/17/16 0259  WBC 9.3  RBC 2.88*  HCT 28.0*  PLT 150   No results for input(s): NA, K, CL, CO2, BUN, CREATININE, GLUCOSE, CALCIUM in the last 72 hours. No results for input(s): LABPT, INR in the last 72 hours.  Well nourished. Alert and oriented x3. RRR, Lungs clear, BS x4. Abdomen soft and non tender. Right Calf soft and non tender. Right knee dressing C/D/I. No DVT signs. Compartment soft. No signs of infection.  Right LE grossly neurovascular intact.  Assessment/Plan: 5 Days Post-Op Procedure(s) (LRB): COMPUTER NAVIGATION ASSISTED TOTAL KNEE ARTHROPLASTY RIGHT (Right) Up with PT D/c to SNF [facility]) today Follow instructions F/u with Dr.Swinteck in office Continue current care  Khasir Woodrome L 07/19/2016, 9:25 AM

## 2016-07-19 NOTE — Progress Notes (Signed)
[facility] here to transport patient to [facility]. Attempted to call [phone] for report and was transferred twice to the wrong extension.  I gave my number to Chikita in the Rehab department, she stated that she would have the nurse call me once she could find her. Patient is in route.

## 2016-07-19 NOTE — Clinical Social Work Note (Signed)
CSW notified that pt medically stable for discharge to SNF. CSW notified SNF pt discharging today and will arrive via [facility]. CSW called [facility] and arranged transport to SNF, and requested RN call SNF and give report.

## 2016-07-21 DIAGNOSIS — Z471 Aftercare following joint replacement surgery: Secondary | ICD-10-CM | POA: Diagnosis not present

## 2016-07-21 DIAGNOSIS — I2510 Atherosclerotic heart disease of native coronary artery without angina pectoris: Secondary | ICD-10-CM | POA: Diagnosis not present

## 2016-07-21 DIAGNOSIS — M1711 Unilateral primary osteoarthritis, right knee: Secondary | ICD-10-CM | POA: Diagnosis not present

## 2016-07-21 DIAGNOSIS — K5900 Constipation, unspecified: Secondary | ICD-10-CM | POA: Diagnosis not present

## 2016-07-22 DIAGNOSIS — N189 Chronic kidney disease, unspecified: Secondary | ICD-10-CM | POA: Diagnosis not present

## 2016-07-22 DIAGNOSIS — R4182 Altered mental status, unspecified: Secondary | ICD-10-CM | POA: Diagnosis not present

## 2016-07-24 ENCOUNTER — Inpatient Hospital Stay (HOSPITAL_COMMUNITY): Payer: Medicare Other

## 2016-07-24 ENCOUNTER — Inpatient Hospital Stay (HOSPITAL_COMMUNITY)
Admission: EM | Admit: 2016-07-24 | Discharge: 2016-07-27 | DRG: 682 | Disposition: A | Discharge status: Skilled Nursing Facility | Payer: Medicare Other | Attending: Internal Medicine | Admitting: Internal Medicine

## 2016-07-24 ENCOUNTER — Emergency Department (HOSPITAL_COMMUNITY): Payer: Medicare Other

## 2016-07-24 DIAGNOSIS — Z87891 Personal history of nicotine dependence: Secondary | ICD-10-CM | POA: Diagnosis not present

## 2016-07-24 DIAGNOSIS — K219 Gastro-esophageal reflux disease without esophagitis: Secondary | ICD-10-CM | POA: Diagnosis present

## 2016-07-24 DIAGNOSIS — Z9841 Cataract extraction status, right eye: Secondary | ICD-10-CM | POA: Diagnosis not present

## 2016-07-24 DIAGNOSIS — R031 Nonspecific low blood-pressure reading: Secondary | ICD-10-CM | POA: Diagnosis not present

## 2016-07-24 DIAGNOSIS — E785 Hyperlipidemia, unspecified: Secondary | ICD-10-CM | POA: Diagnosis present

## 2016-07-24 DIAGNOSIS — I2510 Atherosclerotic heart disease of native coronary artery without angina pectoris: Secondary | ICD-10-CM | POA: Diagnosis present

## 2016-07-24 DIAGNOSIS — Z96651 Presence of right artificial knee joint: Secondary | ICD-10-CM

## 2016-07-24 DIAGNOSIS — R531 Weakness: Secondary | ICD-10-CM | POA: Diagnosis not present

## 2016-07-24 DIAGNOSIS — G9349 Other encephalopathy: Secondary | ICD-10-CM | POA: Diagnosis present

## 2016-07-24 DIAGNOSIS — G9340 Encephalopathy, unspecified: Secondary | ICD-10-CM | POA: Diagnosis present

## 2016-07-24 DIAGNOSIS — Z96653 Presence of artificial knee joint, bilateral: Secondary | ICD-10-CM | POA: Diagnosis present

## 2016-07-24 DIAGNOSIS — Z7982 Long term (current) use of aspirin: Secondary | ICD-10-CM | POA: Diagnosis not present

## 2016-07-24 DIAGNOSIS — J9811 Atelectasis: Secondary | ICD-10-CM | POA: Diagnosis not present

## 2016-07-24 DIAGNOSIS — E860 Dehydration: Secondary | ICD-10-CM | POA: Diagnosis present

## 2016-07-24 DIAGNOSIS — Z9842 Cataract extraction status, left eye: Secondary | ICD-10-CM

## 2016-07-24 DIAGNOSIS — Z96652 Presence of left artificial knee joint: Secondary | ICD-10-CM | POA: Diagnosis not present

## 2016-07-24 DIAGNOSIS — N179 Acute kidney failure, unspecified: Secondary | ICD-10-CM | POA: Diagnosis not present

## 2016-07-24 DIAGNOSIS — I129 Hypertensive chronic kidney disease with stage 1 through stage 4 chronic kidney disease, or unspecified chronic kidney disease: Secondary | ICD-10-CM | POA: Diagnosis present

## 2016-07-24 DIAGNOSIS — Z961 Presence of intraocular lens: Secondary | ICD-10-CM | POA: Diagnosis present

## 2016-07-24 DIAGNOSIS — M25569 Pain in unspecified knee: Secondary | ICD-10-CM | POA: Diagnosis not present

## 2016-07-24 DIAGNOSIS — I10 Essential (primary) hypertension: Secondary | ICD-10-CM | POA: Diagnosis not present

## 2016-07-24 DIAGNOSIS — Z791 Long term (current) use of non-steroidal anti-inflammatories (NSAID): Secondary | ICD-10-CM

## 2016-07-24 DIAGNOSIS — N183 Chronic kidney disease, stage 3 (moderate): Secondary | ICD-10-CM | POA: Diagnosis present

## 2016-07-24 DIAGNOSIS — D649 Anemia, unspecified: Secondary | ICD-10-CM | POA: Diagnosis present

## 2016-07-24 DIAGNOSIS — I959 Hypotension, unspecified: Secondary | ICD-10-CM | POA: Diagnosis present

## 2016-07-24 DIAGNOSIS — Z471 Aftercare following joint replacement surgery: Secondary | ICD-10-CM | POA: Diagnosis not present

## 2016-07-24 DIAGNOSIS — N19 Unspecified kidney failure: Secondary | ICD-10-CM | POA: Diagnosis not present

## 2016-07-24 DIAGNOSIS — E871 Hypo-osmolality and hyponatremia: Secondary | ICD-10-CM | POA: Diagnosis present

## 2016-07-24 DIAGNOSIS — Z87442 Personal history of urinary calculi: Secondary | ICD-10-CM | POA: Diagnosis not present

## 2016-07-24 DIAGNOSIS — I350 Nonrheumatic aortic (valve) stenosis: Secondary | ICD-10-CM

## 2016-07-24 DIAGNOSIS — Z8249 Family history of ischemic heart disease and other diseases of the circulatory system: Secondary | ICD-10-CM

## 2016-07-24 DIAGNOSIS — M1990 Unspecified osteoarthritis, unspecified site: Secondary | ICD-10-CM | POA: Diagnosis present

## 2016-07-24 LAB — CBC WITH DIFFERENTIAL/PLATELET
BASOS PCT: 1 %
Basophils Absolute: 0.1 10*3/uL (ref 0.0–0.1)
EOS ABS: 0.4 10*3/uL (ref 0.0–0.7)
EOS PCT: 4 %
HCT: 26 % — ABNORMAL LOW (ref 39.0–52.0)
HEMOGLOBIN: 9 g/dL — AB (ref 13.0–17.0)
Lymphocytes Relative: 14 %
Lymphs Abs: 1.4 10*3/uL (ref 0.7–4.0)
MCH: 32 pg (ref 26.0–34.0)
MCHC: 34.6 g/dL (ref 30.0–36.0)
MCV: 92.5 fL (ref 78.0–100.0)
Monocytes Absolute: 1 10*3/uL (ref 0.1–1.0)
Monocytes Relative: 11 %
NEUTROS PCT: 70 %
Neutro Abs: 6.8 10*3/uL (ref 1.7–7.7)
PLATELETS: 301 10*3/uL (ref 150–400)
RBC: 2.81 MIL/uL — AB (ref 4.22–5.81)
RDW: 13.3 % (ref 11.5–15.5)
WBC: 9.6 10*3/uL (ref 4.0–10.5)

## 2016-07-24 LAB — URINE MICROSCOPIC-ADD ON: WBC UA: NONE SEEN WBC/hpf (ref 0–5)

## 2016-07-24 LAB — URINALYSIS, ROUTINE W REFLEX MICROSCOPIC
Bilirubin Urine: NEGATIVE
GLUCOSE, UA: NEGATIVE mg/dL
Ketones, ur: NEGATIVE mg/dL
LEUKOCYTES UA: NEGATIVE
NITRITE: NEGATIVE
PH: 6 (ref 5.0–8.0)
Protein, ur: NEGATIVE mg/dL
SPECIFIC GRAVITY, URINE: 1.01 (ref 1.005–1.030)

## 2016-07-24 LAB — COMPREHENSIVE METABOLIC PANEL
ALBUMIN: 3.2 g/dL — AB (ref 3.5–5.0)
ALK PHOS: 35 U/L — AB (ref 38–126)
ALT: 55 U/L (ref 17–63)
ANION GAP: 12 (ref 5–15)
AST: 72 U/L — ABNORMAL HIGH (ref 15–41)
BUN: 76 mg/dL — ABNORMAL HIGH (ref 6–20)
CALCIUM: 9.5 mg/dL (ref 8.9–10.3)
CHLORIDE: 93 mmol/L — AB (ref 101–111)
CO2: 24 mmol/L (ref 22–32)
CREATININE: 4.38 mg/dL — AB (ref 0.61–1.24)
GFR calc non Af Amer: 12 mL/min — ABNORMAL LOW (ref >60)
GFR, EST AFRICAN AMERICAN: 14 mL/min — AB (ref >60)
GLUCOSE: 129 mg/dL — AB (ref 65–99)
Potassium: 4.4 mmol/L (ref 3.5–5.1)
SODIUM: 129 mmol/L — AB (ref 135–145)
Total Bilirubin: 0.4 mg/dL (ref 0.3–1.2)
Total Protein: 6 g/dL — ABNORMAL LOW (ref 6.5–8.1)

## 2016-07-24 MED ORDER — ONDANSETRON HCL 4 MG/2ML IJ SOLN
4.0000 mg | Freq: Once | INTRAMUSCULAR | Status: AC
  Administered 2016-07-24: 4 mg via INTRAVENOUS
  Filled 2016-07-24: qty 2

## 2016-07-24 MED ORDER — MORPHINE SULFATE (PF) 4 MG/ML IV SOLN
4.0000 mg | Freq: Once | INTRAVENOUS | Status: AC
  Administered 2016-07-24: 4 mg via INTRAVENOUS
  Filled 2016-07-24: qty 1

## 2016-07-24 MED ORDER — SODIUM CHLORIDE 0.9 % IV BOLUS (SEPSIS)
1000.0000 mL | Freq: Once | INTRAVENOUS | Status: AC
  Administered 2016-07-24: 1000 mL via INTRAVENOUS

## 2016-07-24 NOTE — ED Notes (Signed)
Attempted to call report

## 2016-07-24 NOTE — ED Provider Notes (Signed)
[facility] DEPT Provider Note   CSN: [id number] Arrival date & time: 07/24/16  1657     History   Chief Complaint Chief Complaint  Patient presents with  . Abnormal Lab    HPI Jeff Rivera is a 75 y.o. male.  HPI Patient is in the emergency department for elevated kidney function levels at his rehabilitation facility.  He underwent right knee orthopedic procedure nearly 2 weeks ago.  He was hospitalized for 5 days and has been at a rehabilitation facility.  His had some decreased oral intake over the past several days and increasing generalized weakness with some mild increasing confusion and was sent to the ER for further evaluation once his team noted that his BUN and creatinine was elevated.  Patient reports some intermittent urinary frequency without dysuria.  He does have a history of urinary retention before in the past.  He feels like he is emptying his bladder completely at this time.  Postvoid residual here in the emergency department is 77 mL   Past Medical History:  Diagnosis Date  . Aortic stenosis    mild   . Arthritis    OA  . CAD (coronary artery disease)   . Chronic kidney disease    creatine 1.5 was 1.5 09/2015  . Complication of anesthesia    patient was confused for 6-8 days.  doesnt remember anything from time he registered several days after he was admitted to [facility]  . Dyslipidemia   . ED (erectile dysfunction)   . GERD (gastroesophageal reflux disease)   . Heart murmur   . History of kidney stones   . Hypertension     Patient Active Problem List   Diagnosis Date Noted  . Osteoarthritis of right knee 07/14/2016  . Arthritis of left knee 10/27/2014  . 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 CAD (coronary artery disease) 09/12/2013  . Aortic stenosis 09/12/2013  . Dyslipidemia 09/12/2013  . HTN (hypertension) 09/12/2013  . DOE (dyspnea on exertion) 09/12/2013    Past Surgical History:  Procedure Laterality Date  . CARDIAC  CATHETERIZATION  01/2010   40% LAD lesion (Dr. Loni Muse. [place])   . COLONOSCOPY    . COLONOSCOPY    . CYSTOSCOPY    . EYE SURGERY Bilateral 2013   LENS REPLACMENT FOR CATRACTS  . KNEE ARTHROPLASTY Right 07/14/2016   Procedure: COMPUTER NAVIGATION ASSISTED TOTAL KNEE ARTHROPLASTY RIGHT;  Surgeon: Rod Can, MD;  Location: [facility];  Service: Orthopedics;  Laterality: Right;  . TIE OFF FOR LOW SPERM COUNT  50 YRS AGO  . TOTAL KNEE ARTHROPLASTY Left 10/27/2014   Procedure: LEFT TOTAL KNEE ARTHROPLASTY;  Surgeon: Mcarthur Rossetti, MD;  Location: [facility];  Service: Orthopedics;  Laterality: Left;  . TRANSTHORACIC ECHOCARDIOGRAM  09/2011   EF=>55%, mild conc LVH; normal RV systolic function; LA mildly dilated, IVC borderline dilated with collapse >50%; mild mitral annular calcif & mild MR; trace TR, elevated RVSP; AV mildly sclerotic, mild calcif of AV leaflets, mild valvular AS, mild regurg; trace pulm valve regurg       Home Medications    Prior to Admission medications   Medication Sig Start Date End Date Taking? Authorizing Provider  alfuzosin (UROXATRAL) 10 MG 24 hr tablet Take 1 tablet by mouth daily. 07/16/15   Historical Provider, MD  aspirin 81 MG chewable tablet Chew 1 tablet (81 mg total) by mouth 2 (two) times daily with a meal. 07/15/16   Rod Can, MD  Cholecalciferol (VITAMIN  D3) 2000 units capsule Take 2,000 Units by mouth daily.    Historical Provider, MD  Coenzyme Q10 (CO Q 10) 100 MG CAPS Take 1 capsule by mouth 2 (two) times daily.    Historical Provider, MD  docusate sodium (COLACE) 100 MG capsule Take 1 capsule (100 mg total) by mouth 2 (two) times daily. 07/15/16   Rod Can, MD  hydrochlorothiazide (MICROZIDE) 12.5 MG capsule Take 1 capsule (12.5 mg total) by mouth daily. Patient not taking: Reported on 07/01/2016 07/31/15   Pixie Casino, MD  lisinopril-hydrochlorothiazide (PRINZIDE,ZESTORETIC) 10-12.5 MG tablet Take 1 tablet by mouth daily.    Historical  Provider, MD  MAGNESIUM PO Take 400 mg by mouth daily.     Historical Provider, MD  meloxicam (MOBIC) 7.5 MG tablet Take 1 tablet by mouth daily as needed for pain.  07/08/15   Historical Provider, MD  Multiple Vitamin (MULTIVITAMIN) tablet Take 1 tablet by mouth daily.    Historical Provider, MD  niacin 500 MG tablet Take 500 mg by mouth 2 (two) times daily with a meal.    Historical Provider, MD  Omega-3 Fatty Acids (FISH OIL) 1200 MG CAPS Take 1 capsule by mouth daily.    Historical Provider, MD  omeprazole (PRILOSEC) 20 MG capsule Take 20 mg by mouth daily.    Historical Provider, MD  ondansetron (ZOFRAN) 4 MG tablet Take 1 tablet (4 mg total) by mouth every 6 (six) hours as needed for nausea. 07/15/16   Rod Can, MD  senna (SENOKOT) 8.6 MG TABS tablet Take 2 tablets (17.2 mg total) by mouth at bedtime. 07/15/16   Rod Can, MD  simvastatin (ZOCOR) 20 MG tablet Take 20 mg by mouth every evening.     Historical Provider, MD  traMADol (ULTRAM) 50 MG tablet Take 1-2 tablets (50-100 mg total) by mouth every 6 (six) hours as needed for moderate pain. 07/16/16   Rod Can, MD    Family History Family History  Problem Relation Age of Onset  . Heart attack Father   . Coronary artery disease Father     Social History Social History  Substance Use Topics  . Smoking status: Former Smoker    Years: 4.00    Types: Cigarettes    Quit date: 09/05/1961  . Smokeless tobacco: Never Used  . Alcohol use 2.4 oz/week    1 Standard drinks or equivalent, 3 Glasses of wine per week     Comment: 3-4 GLASSES WINE PER WEEK     Allergies   No known allergies   Review of Systems Review of Systems  All other systems reviewed and are negative.    Physical Exam Updated Vital Signs BP 105/59 (BP Location: Right Arm)   Pulse 65   Temp 98.2 F (36.8 C) (Oral)   Resp 16   Ht 5\' 8"  (1.727 m)   Wt 175 lb (79.4 kg)   SpO2 (!) 68%   BMI 26.61 kg/m   Physical Exam  Constitutional: He  is oriented to person, place, and time. He appears well-developed and well-nourished.  HENT:  Head: Normocephalic and atraumatic.  Eyes: EOM are normal.  Neck: Normal range of motion.  Cardiovascular: Normal rate, regular rhythm, normal heart sounds and intact distal pulses.   Pulmonary/Chest: Effort normal and breath sounds normal. No respiratory distress.  Abdominal: Soft. He exhibits no distension. There is no tenderness.  Musculoskeletal: Normal range of motion.  Neurological: He is alert and oriented to person, place, and time.  Skin: Skin  is warm and dry.  Psychiatric: He has a normal mood and affect. Judgment normal.  Nursing note and vitals reviewed.    ED Treatments / Results  Labs (all labs ordered are listed, but only abnormal results are displayed) Labs Reviewed  CBC WITH DIFFERENTIAL/PLATELET - Abnormal; Notable for the following:       Result Value   RBC 2.81 (*)    Hemoglobin 9.0 (*)    HCT 26.0 (*)    All other components within normal limits  COMPREHENSIVE METABOLIC PANEL - Abnormal; Notable for the following:    Sodium 129 (*)    Chloride 93 (*)    Glucose, Bld 129 (*)    BUN 76 (*)    Creatinine, Ser 4.38 (*)    Total Protein 6.0 (*)    Albumin 3.2 (*)    AST 72 (*)    Alkaline Phosphatase 35 (*)    GFR calc non Af Amer 12 (*)    GFR calc Af Amer 14 (*)    All other components within normal limits  URINALYSIS, ROUTINE W REFLEX MICROSCOPIC (NOT AT [facility]) - Abnormal; Notable for the following:    Hgb urine dipstick TRACE (*)    All other components within normal limits  URINE MICROSCOPIC-ADD ON - Abnormal; Notable for the following:    Squamous Epithelial / LPF 0-5 (*)    Bacteria, UA RARE (*)    All other components within normal limits    EKG  EKG Interpretation None       Radiology Dg Chest 2 View  Result Date: 07/24/2016 CLINICAL DATA:  History of hypertension and aortic stenosis EXAM: CHEST  2 VIEW COMPARISON:  08/15/2015 FINDINGS: Mild  linear atelectasis at the left base. No acute consolidation or significant effusion. Heart size is nonenlarged. Atherosclerosis of the aorta. No pneumothorax. IMPRESSION: 1. Minimal atelectasis left base. 2. Atherosclerotic vascular disease of the aorta. Electronically Signed   By: Donavan Foil M.D.   On: 07/24/2016 19:31   US Renal  Result Date: 07/24/2016 CLINICAL DATA:  Acute onset of bilateral flank pain and renal failure. Initial encounter. EXAM: RENAL / URINARY TRACT ULTRASOUND COMPLETE COMPARISON:  CT of the abdomen and pelvis from 09/20/2008 FINDINGS: Right Kidney: Length: 10.1 cm. Mildly increased parenchymal echogenicity noted. No mass or hydronephrosis visualized. Left Kidney: Length: 10.5 cm. Mildly increased parenchymal echogenicity noted. No mass or hydronephrosis visualized. Bladder: Appears normal for degree of bladder distention. IMPRESSION: 1. No evidence of hydronephrosis. 2. Increased renal parenchymal echogenicity raises concern for medical renal disease. Electronically Signed   By: Garald Balding M.D.   On: 07/24/2016 23:18    Procedures Procedures (including critical care time)  Medications Ordered in ED Medications  sodium chloride 0.9 % bolus 1,000 mL (not administered)     Initial Impression / Assessment and Plan / ED Course  I have reviewed the triage vital signs and the nursing notes.  Pertinent labs & imaging results that were available during my care of the patient were reviewed by me and considered in my medical decision making (see chart for details).  Clinical Course    Post void residual only shows 77 mL.  Renal ultrasound without hydro-.  Patient be admitted the hospital for additional workup at this time.  IV fluids given.  Final Clinical Impressions(s) / ED Diagnoses   Final diagnoses:  Weakness  Acute renal failure, unspecified acute renal failure type [facility])    New Prescriptions New Prescriptions   No medications on  file     Jola Schmidt,  MD 07/25/16 [phone]

## 2016-07-24 NOTE — H&P (Signed)
History and Physical    CHRISTIANJOSEPH ROTHKOPF [id number] DOB: Apr 25, 1941 DOA: 07/24/2016  PCP: Thressa Sheller, MD   Patient coming from: Rehabilitation facility  Chief Complaint: Abnormal renal function.  HPI: Jeff Rivera is a 75 y.o. male with medical history significant of aortic stenosis, CAD, hypertension, chronic kidney disease, urolithiasis, hyperlipidemia, GERD, osteoarthritis who just underwent a TKA with Dr. Delfino Lovett and is being sent to the emergency department for evaluation of elevated BUN and creatinine.  Apparently, the patient has been having decreased oral intake, increased generalized weakness and mild confusion for the past few days at the rehabilitation facility. Lab work was done and showed elevated BUN and creatinine.  While being examined, the patient was confused at times, one in to get up from bed to go to his home to take care of wife and pets. He was unable to provide much history and was apologetic at times for his confusion/irritability.   ED Course: Workup in the emergency department showed Hb level of 9.0,  BUN of 76 and creatinine of 4.38 mg/dL. renal ultrasound showed increased parenchymal echogenicity.  Review of Systems: As per HPI otherwise 10 point review of systems negative.    Past Medical History:  Diagnosis Date  . Aortic stenosis    mild   . Arthritis    OA  . CAD (coronary artery disease)   . Chronic kidney disease    creatine 1.5 was 1.5 09/2015  . Complication of anesthesia    patient was confused for 6-8 days.  doesnt remember anything from time he registered several days after he was admitted to [facility]  . Dyslipidemia   . ED (erectile dysfunction)   . GERD (gastroesophageal reflux disease)   . Heart murmur   . History of kidney stones   . Hypertension     Past Surgical History:  Procedure Laterality Date  . CARDIAC CATHETERIZATION  01/2010   40% LAD lesion (Dr. Loni Muse. [place])   . COLONOSCOPY    . COLONOSCOPY    . CYSTOSCOPY    . EYE  SURGERY Bilateral 2013   LENS REPLACMENT FOR CATRACTS  . KNEE ARTHROPLASTY Right 07/14/2016   Procedure: COMPUTER NAVIGATION ASSISTED TOTAL KNEE ARTHROPLASTY RIGHT;  Surgeon: Rod Can, MD;  Location: [place];  Service: Orthopedics;  Laterality: Right;  . TIE OFF FOR LOW SPERM COUNT  50 YRS AGO  . TOTAL KNEE ARTHROPLASTY Left 10/27/2014   Procedure: LEFT TOTAL KNEE ARTHROPLASTY;  Surgeon: Mcarthur Rossetti, MD;  Location: [facility];  Service: Orthopedics;  Laterality: Left;  . TRANSTHORACIC ECHOCARDIOGRAM  09/2011   EF=>55%, mild conc LVH; normal RV systolic function; LA mildly dilated, IVC borderline dilated with collapse >50%; mild mitral annular calcif & mild MR; trace TR, elevated RVSP; AV mildly sclerotic, mild calcif of AV leaflets, mild valvular AS, mild regurg; trace pulm valve regurg     reports that he quit smoking about 54 years ago. His smoking use included Cigarettes. He quit after 4.00 years of use. He has never used smokeless tobacco. He reports that he drinks about 2.4 oz of alcohol per week . He reports that he does not use drugs.  Allergies  Allergen Reactions  . No Known Allergies     Family History  Problem Relation Age of Onset  . Heart attack Father   . Coronary artery disease Father     Prior to Admission medications   Medication Sig Start Date End Date Taking? Authorizing Provider  acetaminophen (TYLENOL) 500 MG tablet  Take 500 mg by mouth every 6 (six) hours as needed for mild pain.   Yes Historical Provider, MD  alfuzosin (UROXATRAL) 10 MG 24 hr tablet Take 1 tablet by mouth daily. 07/16/15  Yes Historical Provider, MD  aspirin 81 MG chewable tablet Chew 1 tablet (81 mg total) by mouth 2 (two) times daily with a meal. 07/15/16  Yes Rod Can, MD  Cholecalciferol (VITAMIN D3) 2000 units capsule Take 2,000 Units by mouth daily.   Yes Historical Provider, MD  Coenzyme Q10 (CO Q 10) 100 MG CAPS Take 1 capsule by mouth 2 (two) times daily.   Yes Historical  Provider, MD  docusate sodium (COLACE) 100 MG capsule Take 1 capsule (100 mg total) by mouth 2 (two) times daily. 07/15/16  Yes Rod Can, MD  lisinopril-hydrochlorothiazide (PRINZIDE,ZESTORETIC) 10-12.5 MG tablet Take 1 tablet by mouth daily.   Yes Historical Provider, MD  MAGNESIUM PO Take 400 mg by mouth daily.    Yes Historical Provider, MD  meloxicam (MOBIC) 7.5 MG tablet Take 1 tablet by mouth daily as needed for pain.  07/08/15  Yes Historical Provider, MD  Multiple Vitamin (MULTIVITAMIN) tablet Take 1 tablet by mouth daily.   Yes Historical Provider, MD  niacin 500 MG tablet Take 500 mg by mouth 2 (two) times daily with a meal.   Yes Historical Provider, MD  Omega-3 Fatty Acids (FISH OIL) 1200 MG CAPS Take 1 capsule by mouth daily.   Yes Historical Provider, MD  omeprazole (PRILOSEC) 20 MG capsule Take 20 mg by mouth daily.   Yes Historical Provider, MD  ondansetron (ZOFRAN) 4 MG tablet Take 1 tablet (4 mg total) by mouth every 6 (six) hours as needed for nausea. 07/15/16  Yes Rod Can, MD  senna (SENOKOT) 8.6 MG TABS tablet Take 2 tablets (17.2 mg total) by mouth at bedtime. 07/15/16  Yes Rod Can, MD  simvastatin (ZOCOR) 20 MG tablet Take 20 mg by mouth every evening.    Yes Historical Provider, MD  hydrochlorothiazide (MICROZIDE) 12.5 MG capsule Take 1 capsule (12.5 mg total) by mouth daily. Patient not taking: Reported on 07/24/2016 07/31/15   Pixie Casino, MD  traMADol (ULTRAM) 50 MG tablet Take 1-2 tablets (50-100 mg total) by mouth every 6 (six) hours as needed for moderate pain. 07/16/16   Rod Can, MD    Physical Exam:  Constitutional: Mildly anxious. Vitals:   07/24/16 1715 07/24/16 1745 07/24/16 2121 07/24/16 2122  BP: 111/82 138/78  113/68  Pulse: 101 108 117 120  Resp:      Temp:      TempSrc:      SpO2: 100% 100% 100% 100%  Weight:      Height:       Eyes: PERRL, lids and conjunctivae normal ENMT: Mucous membranes are moist. Posterior pharynx  clear of any exudate or lesions.  Neck: normal, supple, no masses, no thyromegaly Respiratory: clear to auscultation bilaterally, no wheezing, no crackles. Normal respiratory effort. No accessory muscle use.  Cardiovascular: Regular rate and rhythm, Positive SEM, no / rubs / gallops. No extremity edema. 2+ pedal pulses. No carotid bruits.  Abdomen: no tenderness, no masses palpated. No hepatosplenomegaly. Bowel sounds positive.  Musculoskeletal: Right knee dressing without significant discharge. There is still surrounding edema, but no significant erythema, calor or tenderness. Skin: no rashes, lesions, ulcers. No induration Neurologic: CN 2-12 grossly intact. Sensation intact, DTR normal. Strength 5/5 in all 4.  Psychiatric: Alert and oriented x 2, was fully oriented to  time and situation.   Labs on Admission: I have personally reviewed following labs and imaging studies  CBC:  Recent Labs Lab 07/24/16 1837  WBC 9.6  NEUTROABS 6.8  HGB 9.0*  HCT 26.0*  MCV 92.5  PLT [id number]   Basic Metabolic Panel:  Recent Labs Lab 07/24/16 1837  NA 129*  K 4.4  CL 93*  CO2 24  GLUCOSE 129*  BUN 76*  CREATININE 4.38*  CALCIUM 9.5   GFR: Estimated Creatinine Clearance: 13.9 mL/min (by C-G formula based on SCr of 4.38 mg/dL (H)). Liver Function Tests:  Recent Labs Lab 07/24/16 1837  AST 72*  ALT 55  ALKPHOS 35*  BILITOT 0.4  PROT 6.0*  ALBUMIN 3.2*   No results for input(s): LIPASE, AMYLASE in the last 168 hours. No results for input(s): AMMONIA in the last 168 hours. Coagulation Profile: No results for input(s): INR, PROTIME in the last 168 hours. Cardiac Enzymes: No results for input(s): CKTOTAL, CKMB, CKMBINDEX, TROPONINI in the last 168 hours. BNP (last 3 results) No results for input(s): PROBNP in the last 8760 hours. HbA1C: No results for input(s): HGBA1C in the last 72 hours. CBG: No results for input(s): GLUCAP in the last 168 hours. Lipid Profile: No results for  input(s): CHOL, HDL, LDLCALC, TRIG, CHOLHDL, LDLDIRECT in the last 72 hours. Thyroid Function Tests: No results for input(s): TSH, T4TOTAL, FREET4, T3FREE, THYROIDAB in the last 72 hours. Anemia Panel: No results for input(s): VITAMINB12, FOLATE, FERRITIN, TIBC, IRON, RETICCTPCT in the last 72 hours. Urine analysis:    Component Value Date/Time   COLORURINE YELLOW 07/24/2016 2022   APPEARANCEUR CLEAR 07/24/2016 2022   APPEARANCEUR Hazy 11/04/2014 1500   LABSPEC 1.010 07/24/2016 2022   LABSPEC 1.016 11/04/2014 1500   PHURINE 6.0 07/24/2016 2022   GLUCOSEU NEGATIVE 07/24/2016 2022   GLUCOSEU Negative 11/04/2014 1500   HGBUR TRACE (A) 07/24/2016 2022   BILIRUBINUR NEGATIVE 07/24/2016 2022   BILIRUBINUR Negative 11/04/2014 1500   KETONESUR NEGATIVE 07/24/2016 2022   PROTEINUR NEGATIVE 07/24/2016 2022   UROBILINOGEN 1.0 10/20/2014 1111   NITRITE NEGATIVE 07/24/2016 2022   LEUKOCYTESUR NEGATIVE 07/24/2016 2022   LEUKOCYTESUR Negative 11/04/2014 1500     Radiological Exams on Admission: Dg Chest 2 View  Result Date: 07/24/2016 CLINICAL DATA:  History of hypertension and aortic stenosis EXAM: CHEST  2 VIEW COMPARISON:  08/15/2015 FINDINGS: Mild linear atelectasis at the left base. No acute consolidation or significant effusion. Heart size is nonenlarged. Atherosclerosis of the aorta. No pneumothorax. IMPRESSION: 1. Minimal atelectasis left base. 2. Atherosclerotic vascular disease of the aorta. Electronically Signed   By: Donavan Foil M.D.   On: 07/24/2016 19:31   US Renal  Result Date: 07/24/2016 CLINICAL DATA:  Acute onset of bilateral flank pain and renal failure. Initial encounter. EXAM: RENAL / URINARY TRACT ULTRASOUND COMPLETE COMPARISON:  CT of the abdomen and pelvis from 09/20/2008 FINDINGS: Right Kidney: Length: 10.1 cm. Mildly increased parenchymal echogenicity noted. No mass or hydronephrosis visualized. Left Kidney: Length: 10.5 cm. Mildly increased parenchymal echogenicity  noted. No mass or hydronephrosis visualized. Bladder: Appears normal for degree of bladder distention. IMPRESSION: 1. No evidence of hydronephrosis. 2. Increased renal parenchymal echogenicity raises concern for medical renal disease. Electronically Signed   By: Garald Balding M.D.   On: 07/24/2016 23:18   Echocardiogram 02/02/2015 ------------------------------------------------------------------- LV EF: 65% -   70%  ------------------------------------------------------------------- Indications:      Aortic Stenosis (I35.0).  ------------------------------------------------------------------- History:   PMH:  GERD, Aortic Stenosis  Dyspnea.  Coronary artery disease.  Aortic valve disease.  Risk factors:  Hypertension. Dyslipidemia.  ------------------------------------------------------------------- Study Conclusions  - Left ventricle: The cavity size was normal. Systolic function was   vigorous. The estimated ejection fraction was in the range of 65%   to 70%. Wall motion was normal; there were no regional wall   motion abnormalities. Doppler parameters are consistent with   abnormal left ventricular relaxation (grade 1 diastolic   dysfunction). There was no evidence of elevated ventricular   filling pressure by Doppler parameters. - Aortic valve: Trileaflet; severely thickened, severely calcified   leaflets. Cusp separation was moderately reduced. There was mild   to moderate stenosis. There was moderate regurgitation. Mean   gradient (S): 21 mm Hg. Peak gradient (S): 33 mm Hg. Valve area   (VTI): 1.33 cm^2. Valve area (Vmax): 1.28 cm^2. Valve area   (Vmean): 1.23 cm^2. - Aortic root: The aortic root was normal in size. - Mitral valve: Calcified annulus. Mildly thickened leaflets .   There was no regurgitation. - Left atrium: The atrium was normal in size. - Right ventricle: The cavity size was normal. Wall thickness was   normal. Systolic function was normal. - Right  atrium: The atrium was normal in size. - Tricuspid valve: There was mild regurgitation. - Pulmonary arteries: Systolic pressure was within the normal   range. - Inferior vena cava: The vessel was normal in size. The   respirophasic diameter changes were in the normal range (= 50%),   consistent with normal central venous pressure. - Pericardium, extracardiac: There was no pericardial effusion.   Assessment/Plan Principal Problem:   Acute renal failure (ARF) (HCC) Admit to telemetry/inpatient. Continue IV fluids. Hold lisinopril and hydrochlorothiazide. Follow-up BUN and creatinine in a.m. Nephrology consult in the morning.  Active Problems:   CAD (coronary artery disease) Stable. No chest pain at this time. Continue aspirin, simvastatin.    Aortic stenosis Stable. Periodic echocardiographic surveillance.    HTN (hypertension) Hold lisinopril and hydrochlorothiazide.    Status post total left knee replacement Continue local care. Physical therapy to be resumed once the patient improves.    Anemia Monitor hematocrit and hemoglobin.    DVT prophylaxis: Lovenox SQ. Code Status: Full code. Family Communication: Disposition Plan: Admit for fluid challenge and further evaluation. Consults called: Nephrology (Dr. Justin Mend) Admission status: Inpatient/telemetry.   Reubin Milan MD [facility] Pager [phone].  If 7PM-7AM, please contact night-coverage [email or web address] Password [facility]  07/24/2016, 11:32 PM

## 2016-07-24 NOTE — ED Notes (Signed)
Pt to ultrasound

## 2016-07-24 NOTE — ED Triage Notes (Signed)
Patient comes in with abnormal lab result from PCP. BUN and Crt were elevated at PCP and sent here. Crt 4.21 and BUN 71. No significant hx. Recent knee replacement. EMS BP 108/58. No IV access obtained in route.

## 2016-07-24 NOTE — ED Notes (Signed)
Patient taken to Xray  

## 2016-07-25 ENCOUNTER — Encounter (HOSPITAL_COMMUNITY): Payer: Self-pay | Admitting: Internal Medicine

## 2016-07-25 DIAGNOSIS — E871 Hypo-osmolality and hyponatremia: Secondary | ICD-10-CM

## 2016-07-25 DIAGNOSIS — I350 Nonrheumatic aortic (valve) stenosis: Secondary | ICD-10-CM

## 2016-07-25 DIAGNOSIS — I2510 Atherosclerotic heart disease of native coronary artery without angina pectoris: Secondary | ICD-10-CM

## 2016-07-25 DIAGNOSIS — G9349 Other encephalopathy: Secondary | ICD-10-CM | POA: Diagnosis present

## 2016-07-25 DIAGNOSIS — G9340 Encephalopathy, unspecified: Secondary | ICD-10-CM

## 2016-07-25 DIAGNOSIS — Z96651 Presence of right artificial knee joint: Secondary | ICD-10-CM

## 2016-07-25 DIAGNOSIS — N179 Acute kidney failure, unspecified: Principal | ICD-10-CM

## 2016-07-25 DIAGNOSIS — I10 Essential (primary) hypertension: Secondary | ICD-10-CM

## 2016-07-25 LAB — MAGNESIUM: MAGNESIUM: 2.5 mg/dL — AB (ref 1.7–2.4)

## 2016-07-25 LAB — COMPREHENSIVE METABOLIC PANEL
ALBUMIN: 2.4 g/dL — AB (ref 3.5–5.0)
ALK PHOS: 28 U/L — AB (ref 38–126)
ALT: 40 U/L (ref 17–63)
ANION GAP: 9 (ref 5–15)
AST: 47 U/L — ABNORMAL HIGH (ref 15–41)
BUN: 66 mg/dL — ABNORMAL HIGH (ref 6–20)
CALCIUM: 8.5 mg/dL — AB (ref 8.9–10.3)
CHLORIDE: 104 mmol/L (ref 101–111)
CO2: 23 mmol/L (ref 22–32)
Creatinine, Ser: 3.49 mg/dL — ABNORMAL HIGH (ref 0.61–1.24)
GFR calc Af Amer: 18 mL/min — ABNORMAL LOW (ref >60)
GFR calc non Af Amer: 16 mL/min — ABNORMAL LOW (ref >60)
GLUCOSE: 131 mg/dL — AB (ref 65–99)
POTASSIUM: 4.9 mmol/L (ref 3.5–5.1)
SODIUM: 136 mmol/L (ref 135–145)
Total Bilirubin: 0.4 mg/dL (ref 0.3–1.2)
Total Protein: 4.7 g/dL — ABNORMAL LOW (ref 6.5–8.1)

## 2016-07-25 LAB — CBC
HEMATOCRIT: 21.4 % — AB (ref 39.0–52.0)
HEMOGLOBIN: 7.4 g/dL — AB (ref 13.0–17.0)
MCH: 32.5 pg (ref 26.0–34.0)
MCHC: 34.6 g/dL (ref 30.0–36.0)
MCV: 93.9 fL (ref 78.0–100.0)
Platelets: 263 10*3/uL (ref 150–400)
RBC: 2.28 MIL/uL — AB (ref 4.22–5.81)
RDW: 13.8 % (ref 11.5–15.5)
WBC: 7.7 10*3/uL (ref 4.0–10.5)

## 2016-07-25 MED ORDER — SENNA 8.6 MG PO TABS
2.0000 | ORAL_TABLET | Freq: Every day | ORAL | Status: DC
  Administered 2016-07-26: 17.2 mg via ORAL
  Filled 2016-07-25: qty 2

## 2016-07-25 MED ORDER — TRAMADOL HCL 50 MG PO TABS
50.0000 mg | ORAL_TABLET | Freq: Four times a day (QID) | ORAL | Status: DC | PRN
  Administered 2016-07-25 – 2016-07-27 (×2): 50 mg via ORAL
  Filled 2016-07-25 (×2): qty 1

## 2016-07-25 MED ORDER — SODIUM CHLORIDE 0.9% FLUSH
3.0000 mL | Freq: Two times a day (BID) | INTRAVENOUS | Status: DC
  Administered 2016-07-25 – 2016-07-26 (×5): 3 mL via INTRAVENOUS

## 2016-07-25 MED ORDER — NIACIN 500 MG PO TABS
500.0000 mg | ORAL_TABLET | Freq: Two times a day (BID) | ORAL | Status: DC
  Administered 2016-07-25 – 2016-07-27 (×4): 500 mg via ORAL
  Filled 2016-07-25 (×6): qty 1

## 2016-07-25 MED ORDER — ONDANSETRON HCL 4 MG PO TABS: 4.0000 mg | ORAL_TABLET | Freq: Four times a day (QID) | ORAL | Status: DC | PRN

## 2016-07-25 MED ORDER — CO Q 10 100 MG PO CAPS: 1.0000 | ORAL_CAPSULE | Freq: Two times a day (BID) | ORAL | Status: DC

## 2016-07-25 MED ORDER — PANTOPRAZOLE SODIUM 40 MG PO TBEC
40.0000 mg | DELAYED_RELEASE_TABLET | Freq: Every day | ORAL | Status: DC
  Administered 2016-07-25 – 2016-07-27 (×3): 40 mg via ORAL
  Filled 2016-07-25 (×3): qty 1

## 2016-07-25 MED ORDER — VITAMIN D 1000 UNITS PO TABS
2000.0000 [IU] | ORAL_TABLET | Freq: Every day | ORAL | Status: DC
  Administered 2016-07-25 – 2016-07-27 (×3): 2000 [IU] via ORAL
  Filled 2016-07-25 (×3): qty 2

## 2016-07-25 MED ORDER — ACETAMINOPHEN 500 MG PO TABS: 500.0000 mg | ORAL_TABLET | Freq: Four times a day (QID) | ORAL | Status: DC | PRN

## 2016-07-25 MED ORDER — HYDROCODONE-ACETAMINOPHEN 5-325 MG PO TABS
1.0000 | ORAL_TABLET | ORAL | Status: DC | PRN
  Administered 2016-07-25 (×2): 2 via ORAL
  Filled 2016-07-25 (×2): qty 2

## 2016-07-25 MED ORDER — LISINOPRIL 10 MG PO TABS: 10.0000 mg | ORAL_TABLET | Freq: Every day | ORAL | Status: DC

## 2016-07-25 MED ORDER — HYDROCHLOROTHIAZIDE 12.5 MG PO CAPS: 12.5000 mg | ORAL_CAPSULE | Freq: Every day | ORAL | Status: DC

## 2016-07-25 MED ORDER — LORAZEPAM 2 MG/ML IJ SOLN
0.5000 mg | Freq: Four times a day (QID) | INTRAMUSCULAR | Status: DC | PRN
  Administered 2016-07-25 – 2016-07-26 (×2): 0.5 mg via INTRAVENOUS
  Filled 2016-07-25 (×2): qty 1

## 2016-07-25 MED ORDER — DOCUSATE SODIUM 100 MG PO CAPS
100.0000 mg | ORAL_CAPSULE | Freq: Two times a day (BID) | ORAL | Status: DC
  Administered 2016-07-25 – 2016-07-27 (×5): 100 mg via ORAL
  Filled 2016-07-25 (×5): qty 1

## 2016-07-25 MED ORDER — LISINOPRIL-HYDROCHLOROTHIAZIDE 10-12.5 MG PO TABS: 1.0000 | ORAL_TABLET | Freq: Every day | ORAL | Status: DC

## 2016-07-25 MED ORDER — ONDANSETRON HCL 4 MG/2ML IJ SOLN: 4.0000 mg | Freq: Four times a day (QID) | INTRAMUSCULAR | Status: DC | PRN

## 2016-07-25 MED ORDER — SODIUM CHLORIDE 0.9 % IV SOLN
INTRAVENOUS | Status: DC
  Administered 2016-07-25 (×2): 1000 mL via INTRAVENOUS
  Administered 2016-07-25 – 2016-07-26 (×2): via INTRAVENOUS
  Administered 2016-07-26: 1000 mL via INTRAVENOUS

## 2016-07-25 MED ORDER — SODIUM CHLORIDE 0.9 % IV BOLUS (SEPSIS)
1000.0000 mL | Freq: Once | INTRAVENOUS | Status: AC
  Administered 2016-07-25: 1000 mL via INTRAVENOUS

## 2016-07-25 MED ORDER — MAGNESIUM OXIDE 400 (241.3 MG) MG PO TABS
400.0000 mg | ORAL_TABLET | Freq: Every day | ORAL | Status: DC
  Administered 2016-07-25 – 2016-07-27 (×3): 400 mg via ORAL
  Filled 2016-07-25 (×3): qty 1

## 2016-07-25 MED ORDER — ADULT MULTIVITAMIN W/MINERALS CH
1.0000 | ORAL_TABLET | Freq: Every day | ORAL | Status: DC
  Administered 2016-07-25 – 2016-07-27 (×3): 1 via ORAL
  Filled 2016-07-25 (×3): qty 1

## 2016-07-25 MED ORDER — ALFUZOSIN HCL ER 10 MG PO TB24
10.0000 mg | ORAL_TABLET | Freq: Every day | ORAL | Status: DC
  Administered 2016-07-25 – 2016-07-27 (×3): 10 mg via ORAL
  Filled 2016-07-25 (×3): qty 1

## 2016-07-25 MED ORDER — SIMVASTATIN 20 MG PO TABS
20.0000 mg | ORAL_TABLET | Freq: Every evening | ORAL | Status: DC
  Administered 2016-07-25 – 2016-07-26 (×2): 20 mg via ORAL
  Filled 2016-07-25 (×2): qty 1

## 2016-07-25 MED ORDER — ASPIRIN 81 MG PO CHEW
81.0000 mg | CHEWABLE_TABLET | Freq: Two times a day (BID) | ORAL | Status: DC
  Administered 2016-07-25 – 2016-07-27 (×5): 81 mg via ORAL
  Filled 2016-07-25 (×5): qty 1

## 2016-07-25 MED ORDER — ENOXAPARIN SODIUM 30 MG/0.3ML ~~LOC~~ SOLN
30.0000 mg | Freq: Every day | SUBCUTANEOUS | Status: DC
  Administered 2016-07-25 – 2016-07-26 (×2): 30 mg via SUBCUTANEOUS
  Filled 2016-07-25 (×2): qty 0.3

## 2016-07-25 NOTE — Progress Notes (Signed)

## 2016-07-25 NOTE — ED Notes (Signed)
Attempted to call report

## 2016-07-25 NOTE — Progress Notes (Signed)
PROGRESS NOTE    Jeff Rivera  [id number] DOB: 21-Mar-1941 DOA: 07/24/2016 PCP: Thressa Sheller, MD     Brief Narrative:  Jeff Rivera is a 75 y.o. male with medical history significant of aortic stenosis, CAD, hypertension, chronic kidney disease, urolithiasis, hyperlipidemia, GERD, osteoarthritis who just underwent a right TKA with Dr. Delfino Lovett and is being sent to the emergency department for evaluation of elevated BUN and creatinine. Apparently, the patient has been having decreased oral intake, increased generalized weakness and mild confusion for the past few days at the rehabilitation facility. Lab work was done and showed elevated BUN and creatinine. Patient was admitted for acute kidney injury. Nephrology was consulted and patient was given IV fluids.   Assessment & Plan:   Principal Problem:   AKI (acute kidney injury) ([place]) Active Problems:   CAD (coronary artery disease)   Aortic stenosis   HTN (hypertension)   Status post right knee replacement   Anemia   Acute encephalopathy   Hyponatremia   AKI on CKD stage 3 Baseline Cr 1.3-1.5 Likely due to dehydration, poor oral intake, hypotension, ACE Stop ACE, HCTZ  UA negative Renal US: no obstruction or hydronephrosis  Appreciate nephrology  Improving with IVF   Altered mental status Family states that last year after left knee surgery, he was acutely delirious for about 14 days. They agree that this episode of confusion is very similar to that episode, possibly related to pain, meds, acute illness, new surrounding.  Monitor   Hyponatremia Improved with IVF  Monitor BMP   Chronic normocytic anemia Baseline Hgb around 9 - 10 No acute sign of bleed, monitor    CAD  Stable. No chest pain at this time. Continue aspirin, simvastatin.  Aortic stenosis Stable. Periodic echocardiographic surveillance outpatient.   HTN  Hold lisinopril and hydrochlorothiazide. Monitor Bp, stable for now   Status post total  right knee replacement Continue local care. Physical therapy to be resumed once the patient improves.  DVT prophylaxis: lovenox Code Status: full Family Communication: wife and son at bedside Disposition Plan: back to [place] at [facility]    Consultants:   Nephrology   Procedures:   None  Antimicrobials:   None     Subjective: Patient confused, but alert. Unable to tell me any history, but states no pain.   Objective: Vitals:   07/25/16 0000 07/25/16 0045 07/25/16 0556 07/25/16 0944  BP: 110/55 102/66 (!) 98/43 (!) 108/55  Pulse: 105 (!) 111 99 96  Resp:  17 18 18   Temp:  99.4 F (37.4 C) 98.8 F (37.1 C) 99.3 F (37.4 C)  TempSrc:  Oral Axillary Axillary  SpO2: 93% 95% 97% 96%  Weight:  80.3 kg (177 lb)    Height:  5\' 8"  (1.727 m)      Intake/Output Summary (Last 24 hours) at 07/25/16 1234 Last data filed at 07/25/16 1100  Gross per 24 hour  Intake             2445 ml  Output             1025 ml  Net             1420 ml   Filed Weights   07/24/16 1701 07/25/16 0045  Weight: 79.4 kg (175 lb) 80.3 kg (177 lb)    Examination:  General exam: Appears calm and comfortable  Respiratory system: Clear to auscultation. Respiratory effort normal. Cardiovascular system: S1 & S2 heard, RRR. +systolic murmur, trace edema Gastrointestinal system: Abdomen  is nondistended, soft and nontender. No organomegaly or masses felt. Normal bowel sounds heard. Central nervous system: Alert and oriented. No focal neurological deficits. Extremities: Symmetric 5 x 5 power. Right LE with bandage s/p TKA  Skin: No rashes, lesions or ulcers Psychiatry: Judgement and insight appear normal. Mood & affect appropriate.   Data Reviewed: I have personally reviewed following labs and imaging studies  CBC:  Recent Labs Lab 07/24/16 1837 07/25/16 0452  WBC 9.6 7.7  NEUTROABS 6.8  --   HGB 9.0* 7.4*  HCT 26.0* 21.4*  MCV 92.5 93.9  PLT 301 [id number]   Basic Metabolic Panel:  Recent  Labs Lab 07/24/16 1837 07/25/16 0452  NA 129* 136  K 4.4 4.9  CL 93* 104  CO2 24 23  GLUCOSE 129* 131*  BUN 76* 66*  CREATININE 4.38* 3.49*  CALCIUM 9.5 8.5*  MG  --  2.5*   GFR: Estimated Creatinine Clearance: 17.4 mL/min (by C-G formula based on SCr of 3.49 mg/dL (H)). Liver Function Tests:  Recent Labs Lab 07/24/16 1837 07/25/16 0452  AST 72* 47*  ALT 55 40  ALKPHOS 35* 28*  BILITOT 0.4 0.4  PROT 6.0* 4.7*  ALBUMIN 3.2* 2.4*   No results for input(s): LIPASE, AMYLASE in the last 168 hours. No results for input(s): AMMONIA in the last 168 hours. Coagulation Profile: No results for input(s): INR, PROTIME in the last 168 hours. Cardiac Enzymes: No results for input(s): CKTOTAL, CKMB, CKMBINDEX, TROPONINI in the last 168 hours. BNP (last 3 results) No results for input(s): PROBNP in the last 8760 hours. HbA1C: No results for input(s): HGBA1C in the last 72 hours. CBG: No results for input(s): GLUCAP in the last 168 hours. Lipid Profile: No results for input(s): CHOL, HDL, LDLCALC, TRIG, CHOLHDL, LDLDIRECT in the last 72 hours. Thyroid Function Tests: No results for input(s): TSH, T4TOTAL, FREET4, T3FREE, THYROIDAB in the last 72 hours. Anemia Panel: No results for input(s): VITAMINB12, FOLATE, FERRITIN, TIBC, IRON, RETICCTPCT in the last 72 hours. Sepsis Labs: No results for input(s): PROCALCITON, LATICACIDVEN in the last 168 hours.  No results found for this or any previous visit (from the past 240 hour(s)).     Radiology Studies: Dg Chest 2 View  Result Date: 07/24/2016 CLINICAL DATA:  History of hypertension and aortic stenosis EXAM: CHEST  2 VIEW COMPARISON:  08/15/2015 FINDINGS: Mild linear atelectasis at the left base. No acute consolidation or significant effusion. Heart size is nonenlarged. Atherosclerosis of the aorta. No pneumothorax. IMPRESSION: 1. Minimal atelectasis left base. 2. Atherosclerotic vascular disease of the aorta. Electronically Signed    By: Donavan Foil M.D.   On: 07/24/2016 19:31   US Renal  Result Date: 07/24/2016 CLINICAL DATA:  Acute onset of bilateral flank pain and renal failure. Initial encounter. EXAM: RENAL / URINARY TRACT ULTRASOUND COMPLETE COMPARISON:  CT of the abdomen and pelvis from 09/20/2008 FINDINGS: Right Kidney: Length: 10.1 cm. Mildly increased parenchymal echogenicity noted. No mass or hydronephrosis visualized. Left Kidney: Length: 10.5 cm. Mildly increased parenchymal echogenicity noted. No mass or hydronephrosis visualized. Bladder: Appears normal for degree of bladder distention. IMPRESSION: 1. No evidence of hydronephrosis. 2. Increased renal parenchymal echogenicity raises concern for medical renal disease. Electronically Signed   By: Garald Balding M.D.   On: 07/24/2016 23:18      Scheduled Meds: . alfuzosin  10 mg Oral Daily  . aspirin  81 mg Oral BID WC  . cholecalciferol  2,000 Units Oral Daily  . docusate sodium  100 mg Oral BID  . enoxaparin (LOVENOX) injection  30 mg Subcutaneous Daily  . magnesium oxide  400 mg Oral Daily  . multivitamin with minerals  1 tablet Oral Daily  . niacin  500 mg Oral BID WC  . pantoprazole  40 mg Oral Daily  . senna  2 tablet Oral QHS  . simvastatin  20 mg Oral QPM  . sodium chloride flush  3 mL Intravenous Q12H   Continuous Infusions: . sodium chloride 125 mL/hr at 07/25/16 0525     LOS: 1 day    Time spent: 40 minutes    Dessa Phi, DO [facility] Pager [phone]  If 7PM-7AM, please contact night-coverage [email or web address] Password [email or web address] 07/25/2016, 12:34 PM

## 2016-07-25 NOTE — Consult Note (Signed)
Jeff Rivera is an 75 y.o. male referred by Dr Maylene Roes   Chief Complaint: Acute on CKD 3 HPI: 75yo WM who is SP Rt TKA on 07/14/16 and was Dc'd to rehab facility 07/19/16 and was brought to ER yest for increasing weakness, poor PO intake and increasing confusion.  He has baseline CKD 3 with Scr 1.3-1.5 since at least 2016 and in ER Scr 4.38 and today 3.49.   Put out 825cc urine in past shift.  Pt currently not a reliable historian and thus I am unable to get good hx from him.  There is no information from the rehab center. He was Dc'd from [facility] on lisinopril.  Bp lowish in ER.  Renal US shows Rt 10.1cm, Lt 10.5cm with sl increase in echogenicity and no hydro. Past Medical History:  Diagnosis Date  . Aortic stenosis    mild   . Arthritis    OA  . CAD (coronary artery disease)   . Chronic kidney disease    creatine 1.5 was 1.5 09/2015  . Complication of anesthesia    patient was confused for 6-8 days.  doesnt remember anything from time he registered several days after he was admitted to [facility]  . Dyslipidemia   . ED (erectile dysfunction)   . GERD (gastroesophageal reflux disease)   . Heart murmur   . History of kidney stones   . Hypertension     Past Surgical History:  Procedure Laterality Date  . CARDIAC CATHETERIZATION  01/2010   40% LAD lesion (Dr. Loni Muse. [place])   . COLONOSCOPY    . COLONOSCOPY    . CYSTOSCOPY    . EYE SURGERY Bilateral 2013   LENS REPLACMENT FOR CATRACTS  . KNEE ARTHROPLASTY Right 07/14/2016   Procedure: COMPUTER NAVIGATION ASSISTED TOTAL KNEE ARTHROPLASTY RIGHT;  Surgeon: Rod Can, MD;  Location: [facility];  Service: Orthopedics;  Laterality: Right;  . TIE OFF FOR LOW SPERM COUNT  50 YRS AGO  . TOTAL KNEE ARTHROPLASTY Left 10/27/2014   Procedure: LEFT TOTAL KNEE ARTHROPLASTY;  Surgeon: Mcarthur Rossetti, MD;  Location: [facility];  Service: Orthopedics;  Laterality: Left;  . TRANSTHORACIC ECHOCARDIOGRAM  09/2011   EF=>55%, mild conc LVH; normal RV systolic function;  LA mildly dilated, IVC borderline dilated with collapse >50%; mild mitral annular calcif & mild MR; trace TR, elevated RVSP; AV mildly sclerotic, mild calcif of AV leaflets, mild valvular AS, mild regurg; trace pulm valve regurg    Family History  Problem Relation Age of Onset  . Heart attack Father   . Coronary artery disease Father    Social History:  reports that he quit smoking about 54 years ago. His smoking use included Cigarettes. He quit after 4.00 years of use. He has never used smokeless tobacco. He reports that he drinks about 2.4 oz of alcohol per week . He reports that he does not use drugs. Residing at rehab facility  Allergies:  Allergies  Allergen Reactions  . No Known Allergies     Medications Prior to Admission  Medication Sig Dispense Refill  . acetaminophen (TYLENOL) 500 MG tablet Take 500 mg by mouth every 6 (six) hours as needed for mild pain.    Marland Kitchen alfuzosin (UROXATRAL) 10 MG 24 hr tablet Take 1 tablet by mouth daily.    Marland Kitchen aspirin 81 MG chewable tablet Chew 1 tablet (81 mg total) by mouth 2 (two) times daily with a meal. 60 tablet 1  . Cholecalciferol (VITAMIN D3) 2000 units capsule  Take 2,000 Units by mouth daily.    . Coenzyme Q10 (CO Q 10) 100 MG CAPS Take 1 capsule by mouth 2 (two) times daily.    Marland Kitchen docusate sodium (COLACE) 100 MG capsule Take 1 capsule (100 mg total) by mouth 2 (two) times daily. 60 capsule 3  . lisinopril-hydrochlorothiazide (PRINZIDE,ZESTORETIC) 10-12.5 MG tablet Take 1 tablet by mouth daily.    Marland Kitchen MAGNESIUM PO Take 400 mg by mouth daily.     . meloxicam (MOBIC) 7.5 MG tablet Take 1 tablet by mouth daily as needed for pain.     . Multiple Vitamin (MULTIVITAMIN) tablet Take 1 tablet by mouth daily.    . niacin 500 MG tablet Take 500 mg by mouth 2 (two) times daily with a meal.    . Omega-3 Fatty Acids (FISH OIL) 1200 MG CAPS Take 1 capsule by mouth daily.    Marland Kitchen omeprazole (PRILOSEC) 20 MG capsule Take 20 mg by mouth daily.    . ondansetron  (ZOFRAN) 4 MG tablet Take 1 tablet (4 mg total) by mouth every 6 (six) hours as needed for nausea. 20 tablet 0  . senna (SENOKOT) 8.6 MG TABS tablet Take 2 tablets (17.2 mg total) by mouth at bedtime. 60 each 3  . simvastatin (ZOCOR) 20 MG tablet Take 20 mg by mouth every evening.     . hydrochlorothiazide (MICROZIDE) 12.5 MG capsule Take 1 capsule (12.5 mg total) by mouth daily. (Patient not taking: Reported on 07/24/2016) 30 capsule 2  . traMADol (ULTRAM) 50 MG tablet Take 1-2 tablets (50-100 mg total) by mouth every 6 (six) hours as needed for moderate pain. 60 tablet 0     Lab Results: UA: Benign  Recent Labs  07/24/16 1837 07/25/16 0452  WBC 9.6 7.7  HGB 9.0* 7.4*  HCT 26.0* 21.4*  PLT 301 263   BMET  Recent Labs  07/24/16 1837 07/25/16 0452  NA 129* 136  K 4.4 4.9  CL 93* 104  CO2 24 23  GLUCOSE 129* 131*  BUN 76* 66*  CREATININE 4.38* 3.49*  CALCIUM 9.5 8.5*   LFT  Recent Labs  07/25/16 0452  PROT 4.7*  ALBUMIN 2.4*  AST 47*  ALT 40  ALKPHOS 28*  BILITOT 0.4   Dg Chest 2 View  Result Date: 07/24/2016 CLINICAL DATA:  History of hypertension and aortic stenosis EXAM: CHEST  2 VIEW COMPARISON:  08/15/2015 FINDINGS: Mild linear atelectasis at the left base. No acute consolidation or significant effusion. Heart size is nonenlarged. Atherosclerosis of the aorta. No pneumothorax. IMPRESSION: 1. Minimal atelectasis left base. 2. Atherosclerotic vascular disease of the aorta. Electronically Signed   By: Donavan Foil M.D.   On: 07/24/2016 19:31   US Renal  Result Date: 07/24/2016 CLINICAL DATA:  Acute onset of bilateral flank pain and renal failure. Initial encounter. EXAM: RENAL / URINARY TRACT ULTRASOUND COMPLETE COMPARISON:  CT of the abdomen and pelvis from 09/20/2008 FINDINGS: Right Kidney: Length: 10.1 cm. Mildly increased parenchymal echogenicity noted. No mass or hydronephrosis visualized. Left Kidney: Length: 10.5 cm. Mildly increased parenchymal  echogenicity noted. No mass or hydronephrosis visualized. Bladder: Appears normal for degree of bladder distention. IMPRESSION: 1. No evidence of hydronephrosis. 2. Increased renal parenchymal echogenicity raises concern for medical renal disease. Electronically Signed   By: Garald Balding M.D.   On: 07/24/2016 23:18    ROS: No reliable but... Denies any change in vision No SOB No CP No abd pain Pain in Rt knee No dysuria  PHYSICAL EXAM: Blood pressure (!) 98/43, pulse 99, temperature 98.8 F (37.1 C), temperature source Axillary, resp. rate 18, height 5\' 8"  (1.727 m), weight 80.3 kg (177 lb), SpO2 97 %. HEENT: PERRLA EOMI NECK:No JVD LUNGS:Clear CARDIAC:RRR wo MRG ABD:+ BS NTND No HSM EXT:Tr edema in RLE.  Rt knee with bandage NEURO:CNI, Awake and alert and follows commands but not oriented, resting tremor oh hands, no asterixis  Assessment: 1. Acute on CKD 3 (baseline 1.3-1.5) most likely secondary to volume depletion, hypotension on ACEI. 2. SP Rt TKA 3. Hx CAD PLAN: 1. Cont IV fluids 2. Leave off ACE 3. Daily labs 4. Try to get info from NH 5. Check Mag level  Lamiah Marmol T 07/25/2016, 9:34 AM

## 2016-07-26 LAB — CBC
HEMATOCRIT: 26 % — AB (ref 39.0–52.0)
HEMOGLOBIN: 8.7 g/dL — AB (ref 13.0–17.0)
MCH: 32 pg (ref 26.0–34.0)
MCHC: 33.5 g/dL (ref 30.0–36.0)
MCV: 95.6 fL (ref 78.0–100.0)
Platelets: 295 10*3/uL (ref 150–400)
RBC: 2.72 MIL/uL — ABNORMAL LOW (ref 4.22–5.81)
RDW: 14 % (ref 11.5–15.5)
WBC: 6.6 10*3/uL (ref 4.0–10.5)

## 2016-07-26 LAB — RENAL FUNCTION PANEL
Albumin: 2.6 g/dL — ABNORMAL LOW (ref 3.5–5.0)
Anion gap: 5 (ref 5–15)
BUN: 34 mg/dL — ABNORMAL HIGH (ref 6–20)
CHLORIDE: 107 mmol/L (ref 101–111)
CO2: 27 mmol/L (ref 22–32)
Calcium: 9.3 mg/dL (ref 8.9–10.3)
Creatinine, Ser: 1.72 mg/dL — ABNORMAL HIGH (ref 0.61–1.24)
GFR calc Af Amer: 43 mL/min — ABNORMAL LOW (ref >60)
GFR calc non Af Amer: 37 mL/min — ABNORMAL LOW (ref >60)
Glucose, Bld: 112 mg/dL — ABNORMAL HIGH (ref 65–99)
PHOSPHORUS: 2.6 mg/dL (ref 2.5–4.6)
POTASSIUM: 4.6 mmol/L (ref 3.5–5.1)
Sodium: 139 mmol/L (ref 135–145)

## 2016-07-26 MED ORDER — HYDROCODONE-ACETAMINOPHEN 5-325 MG PO TABS
1.0000 | ORAL_TABLET | Freq: Four times a day (QID) | ORAL | Status: DC | PRN
  Administered 2016-07-26: 2 via ORAL
  Filled 2016-07-26: qty 2

## 2016-07-26 MED ORDER — ENOXAPARIN SODIUM 40 MG/0.4ML ~~LOC~~ SOLN
40.0000 mg | Freq: Every day | SUBCUTANEOUS | Status: DC
  Administered 2016-07-27: 40 mg via SUBCUTANEOUS
  Filled 2016-07-26: qty 0.4

## 2016-07-26 NOTE — Evaluation (Signed)
Physical Therapy Evaluation Patient Details Name: Jeff Rivera MRN: [id number] DOB: October 10, 1941 Today's Date: 07/26/2016   History of Present Illness  Patient is a 75 yo male admitted 07/24/16 with weakness, confusion, and abnormal renal function for evaluation of elevated BUN and creatinine.   Patient with recent Rt TKA 07/14/16 and is WBAT per medical record.   PMH:  aortic stenosis, CAD, hypertension, chronic kidney disease, urolithiasis, hyperlipidemia, GERD, osteoarthritis, anemia    Clinical Impression  Patient presents with problems listed below.  Will benefit from acute PT to maximize functional mobility prior to d/c.  Recommend patient return to SNF for continued therapy.    Follow Up Recommendations SNF;Supervision/Assistance - 24 hour    Equipment Recommendations  None recommended by PT    Recommendations for Other Services       Precautions / Restrictions Precautions Precautions: Knee;Fall Precaution Booklet Issued: No Precaution Comments: Reviewed no pillow under knee. Restrictions Weight Bearing Restrictions: Yes RLE Weight Bearing: Weight bearing as tolerated      Mobility  Bed Mobility Overal bed mobility: Needs Assistance Bed Mobility: Supine to Sit;Sit to Supine     Supine to sit: Max assist;HOB elevated Sit to supine: Max assist   General bed mobility comments: Verbal cues for technique.  Assist to bring trunk to sitting position and hips around to EOB using bed pads.  Once upright, patient able to maintain sitting balance with UE support.  Assist to control trunk and bring LE's onto bed to return to supine.  Transfers Overall transfer level: Needs assistance Equipment used: Rolling walker (2 wheeled) Transfers: Sit to/from Stand Sit to Stand: Max assist         General transfer comment: Verbal cues for hand placement.  Able to move to standing on third attempt with max assist to power up.  Patient stood x 60 seconds and fatigued.  Returned to  sitting with assist to control descent.  Ambulation/Gait             General Gait Details: Unable today  Stairs            Wheelchair Mobility    Modified Rankin (Stroke Patients Only)       Balance Overall balance assessment: Needs assistance Sitting-balance support: Bilateral upper extremity supported;Feet supported Sitting balance-[name]: Poor   Postural control: Posterior lean Standing balance support: Bilateral upper extremity supported Standing balance-[name]: Zero Standing balance comment: Required max assist to maintain upright balance                             Pertinent Vitals/Pain Pain Assessment: 0-10 Pain Score: 5  Pain Location: Rt knee Pain Descriptors / Indicators: Aching;Sore Pain Intervention(s): Limited activity within patient's tolerance;Monitored during session;Repositioned    Home Living Family/patient expects to be discharged to:: Skilled nursing facility Living Arrangements: Spouse/significant other             Home Equipment: Walker - 2 wheels;Cane - single point;Bedside commode;Shower seat - built in;Shower seat Additional Comments: Patient will return to [facility] SNF for continued rehab    Prior Function Level of Independence: Needs assistance   Gait / Transfers Assistance Needed: Working with PT on gait with RW  ADL's / Homemaking Assistance Needed: Assist for ADL's        Hand Dominance   Dominant Hand: Right    Extremity/Trunk Assessment   Upper Extremity Assessment: Generalized weakness  Lower Extremity Assessment: Generalized weakness;RLE deficits/detail RLE Deficits / Details: Decreased knee ROM to -15* ext and 70* flexion;  Strength grossly 3+/5    Cervical / Trunk Assessment: Kyphotic  Communication   Communication: No difficulties  Cognition Arousal/Alertness: Awake/alert Behavior During Therapy: WFL for tasks assessed/performed ("Irritated"  "I should be better by  now") Overall Cognitive Status: No family/caregiver present to determine baseline cognitive functioning Area of Impairment: Memory;Problem solving     Memory: Decreased short-term memory;Decreased recall of precautions       Problem Solving: Slow processing;Difficulty sequencing;Requires verbal cues General Comments: Patient repeating himself multiple times during conversation.    General Comments      Exercises Total Joint Exercises Ankle Circles/Pumps: AROM;Both;10 reps;Supine Quad Sets: AROM;Right;10 reps;Supine Short Arc Quad: AROM;Right;10 reps;Supine Heel Slides: AAROM;Right;5 reps;Supine Long Arc Quad: AROM;Right;5 reps;Seated Knee Flexion: AROM;Right;5 reps;Seated Goniometric ROM: 15-70 degrees Rt knee AROM   Assessment/Plan    PT Assessment Patient needs continued PT services  PT Problem List Decreased strength;Decreased range of motion;Decreased activity tolerance;Decreased balance;Decreased mobility;Decreased cognition;Decreased knowledge of use of DME;Decreased safety awareness;Decreased knowledge of precautions;Pain          PT Treatment Interventions DME instruction;Gait training;Functional mobility training;Therapeutic activities;Therapeutic exercise;Patient/family education;Cognitive remediation    PT Goals (Current goals can be found in the Care Plan section)  Acute Rehab PT Goals Patient Stated Goal: To walk PT Goal Formulation: With patient Time For Goal Achievement: 08/09/16 Potential to Achieve Goals: Fair    Frequency Min 5X/week   Barriers to discharge Decreased caregiver support Wife unable to provided physical assist    Co-evaluation               End of Session Equipment Utilized During Treatment: Gait belt Activity Tolerance: Patient limited by fatigue;Patient limited by pain Patient left: in bed;with call bell/phone within reach;with bed alarm set Nurse Communication: Mobility status         Time: [id number] PT Time  Calculation (min) (ACUTE ONLY): 29 min   Charges:   PT Evaluation $PT Eval Moderate Complexity: 1 Procedure PT Treatments $Therapeutic Activity: 8-22 mins   PT G Codes:        Despina Pole 08/13/2016, 6:12 PM Carita Pian. Sanjuana Kava, [place] Pager [phone]

## 2016-07-26 NOTE — Care Management Note (Signed)
Case Management Note  Patient Details  Name: Jeff Rivera MRN: [id number] Date of Birth: Mar 22, 1941  Subjective/Objective:    AKI, s/p TKA                 Action/Plan: Discharge Planning: Received call from [place]. Pt active with HH PT. NCM will continue to follow for dc needs. Waiting final recommendations for home.    Expected Discharge Date:                  Expected Discharge Plan:  [place]  In-House Referral:  NA  Discharge planning Services  CM Consult  Post Acute Care Choice:  Home Health, Resumption of Svcs/PTA Provider Choice offered to:  Patient  DME Arranged:  N/A DME Agency:  NA  HH Arranged:  PT [place] Agency:  [facility] (now [facility])  Status of Service:  In process, will continue to follow  If discussed at Long Length of Stay Meetings, dates discussed:    Additional Comments:  Erenest Rasher, RN 07/26/2016, 9:53 AM

## 2016-07-26 NOTE — Progress Notes (Signed)
PROGRESS NOTE    Jeff Rivera  [id number] DOB: June 26, 1941 DOA: 07/24/2016 PCP: Thressa Sheller, MD     Brief Narrative:  Jeff Rivera is a 75 y.o. male with medical history significant of aortic stenosis, CAD, hypertension, chronic kidney disease, urolithiasis, hyperlipidemia, GERD, osteoarthritis who just underwent a right TKA with Dr. Delfino Lovett and is being sent to the emergency department for evaluation of elevated BUN and creatinine. Apparently, the patient has been having decreased oral intake, increased generalized weakness and mild confusion for the past few days at the rehabilitation facility. Lab work was done and showed elevated BUN and creatinine. Patient was admitted for acute kidney injury. Nephrology was consulted and patient was given IV fluids.   Assessment & Plan:   Principal Problem:   AKI (acute kidney injury) ([place]) Active Problems:   CAD (coronary artery disease)   Aortic stenosis   HTN (hypertension)   Status post right knee replacement   Anemia   Acute encephalopathy   Hyponatremia   AKI on CKD stage 3 - improving Baseline Cr 1.3-1.5 Likely due to dehydration, poor oral intake, hypotension, ACE Stop ACE, HCTZ  UA negative Renal US: no obstruction or hydronephrosis  Appreciate nephrology  Improving with IVF   Altered mental status Family states that last year after left knee surgery, he was acutely delirious for about 14 days. They agree that this episode of confusion is very similar to that episode, possibly related to pain, meds, acute illness, new surrounding.  Monitor, improving   Hyponatremia Resolved with IVF, holding HCTZ   Monitor BMP   Chronic normocytic anemia Baseline Hgb around 9 - 10 No acute sign of bleed, monitor    CAD  Stable. No chest pain at this time. Continue aspirin, simvastatin.  Aortic stenosis Stable. Periodic echocardiographic surveillance outpatient.   HTN  Hold lisinopril and hydrochlorothiazide. Monitor Bp,  stable for now   Status post total right knee replacement Continue local care. Physical therapy to be resumed once the patient improves.  DVT prophylaxis: lovenox Code Status: full Family Communication: Spoke with son over the phone with patient's permission Disposition Plan: back to [facility]? PT and OT to eval   Consultants:   Nephrology   Procedures:   None  Antimicrobials:   None     Subjective: Patient doing well this morning. He states that he rested well last night. No complaints today. Has no specific complaints this morning; denies any fevers, chest pain, cough, shortness of breath, nausea, vomiting, diarrhea, abdominal pain, dysuria. Patient is tolerating meal.   Objective: Vitals:   07/25/16 1814 07/25/16 2105 07/26/16 0500 07/26/16 1005  BP: 122/61 103/60 97/74 122/62  Pulse: (!) 103 93 91 96  Resp: 18 19 18 17   Temp: 98.6 F (37 C) 97.8 F (36.6 C) 99.4 F (37.4 C) 99.2 F (37.3 C)  TempSrc: Oral Oral Oral Oral  SpO2: 99% 97% 97% 97%  Weight:  85.3 kg (188 lb)    Height:        Intake/Output Summary (Last 24 hours) at 07/26/16 1032 Last data filed at 07/26/16 1025  Gross per 24 hour  Intake             1353 ml  Output             2550 ml  Net            -1197 ml   Filed Weights   07/24/16 1701 07/25/16 0045 07/25/16 2105  Weight: 79.4 kg (  175 lb) 80.3 kg (177 lb) 85.3 kg (188 lb)    Examination:  General exam: Appears calm and comfortable  Respiratory system: Clear to auscultation. Respiratory effort normal. Cardiovascular system: S1 & S2 heard, RRR. +systolic murmur, trace edema Gastrointestinal system: Abdomen is nondistended, soft and nontender. No organomegaly or masses felt. Normal bowel sounds heard. Central nervous system: Alert and oriented. No focal neurological deficits. Extremities: Symmetric 5 x 5 power. Right LE with bandage s/p TKA  Skin: No rashes, lesions or ulcers Psychiatry: Judgement and insight appear  normal. Mood & affect appropriate.   Data Reviewed: I have personally reviewed following labs and imaging studies  CBC:  Recent Labs Lab 07/24/16 1837 07/25/16 0452 07/26/16 0819  WBC 9.6 7.7 6.6  NEUTROABS 6.8  --   --   HGB 9.0* 7.4* 8.7*  HCT 26.0* 21.4* 26.0*  MCV 92.5 93.9 95.6  PLT 301 263 [id number]   Basic Metabolic Panel:  Recent Labs Lab 07/24/16 1837 07/25/16 0452 07/26/16 0659  NA 129* 136 139  K 4.4 4.9 4.6  CL 93* 104 107  CO2 24 23 27   GLUCOSE 129* 131* 112*  BUN 76* 66* 34*  CREATININE 4.38* 3.49* 1.72*  CALCIUM 9.5 8.5* 9.3  MG  --  2.5*  --   PHOS  --   --  2.6   GFR: Estimated Creatinine Clearance: 38.9 mL/min (by C-G formula based on SCr of 1.72 mg/dL (H)). Liver Function Tests:  Recent Labs Lab 07/24/16 1837 07/25/16 0452 07/26/16 0659  AST 72* 47*  --   ALT 55 40  --   ALKPHOS 35* 28*  --   BILITOT 0.4 0.4  --   PROT 6.0* 4.7*  --   ALBUMIN 3.2* 2.4* 2.6*   No results for input(s): LIPASE, AMYLASE in the last 168 hours. No results for input(s): AMMONIA in the last 168 hours. Coagulation Profile: No results for input(s): INR, PROTIME in the last 168 hours. Cardiac Enzymes: No results for input(s): CKTOTAL, CKMB, CKMBINDEX, TROPONINI in the last 168 hours. BNP (last 3 results) No results for input(s): PROBNP in the last 8760 hours. HbA1C: No results for input(s): HGBA1C in the last 72 hours. CBG: No results for input(s): GLUCAP in the last 168 hours. Lipid Profile: No results for input(s): CHOL, HDL, LDLCALC, TRIG, CHOLHDL, LDLDIRECT in the last 72 hours. Thyroid Function Tests: No results for input(s): TSH, T4TOTAL, FREET4, T3FREE, THYROIDAB in the last 72 hours. Anemia Panel: No results for input(s): VITAMINB12, FOLATE, FERRITIN, TIBC, IRON, RETICCTPCT in the last 72 hours. Sepsis Labs: No results for input(s): PROCALCITON, LATICACIDVEN in the last 168 hours.  No results found for this or any previous visit (from the past 240  hour(s)).     Radiology Studies: Dg Chest 2 View  Result Date: 07/24/2016 CLINICAL DATA:  History of hypertension and aortic stenosis EXAM: CHEST  2 VIEW COMPARISON:  08/15/2015 FINDINGS: Mild linear atelectasis at the left base. No acute consolidation or significant effusion. Heart size is nonenlarged. Atherosclerosis of the aorta. No pneumothorax. IMPRESSION: 1. Minimal atelectasis left base. 2. Atherosclerotic vascular disease of the aorta. Electronically Signed   By: Donavan Foil M.D.   On: 07/24/2016 19:31   US Renal  Result Date: 07/24/2016 CLINICAL DATA:  Acute onset of bilateral flank pain and renal failure. Initial encounter. EXAM: RENAL / URINARY TRACT ULTRASOUND COMPLETE COMPARISON:  CT of the abdomen and pelvis from 09/20/2008 FINDINGS: Right Kidney: Length: 10.1 cm. Mildly increased parenchymal echogenicity noted.  No mass or hydronephrosis visualized. Left Kidney: Length: 10.5 cm. Mildly increased parenchymal echogenicity noted. No mass or hydronephrosis visualized. Bladder: Appears normal for degree of bladder distention. IMPRESSION: 1. No evidence of hydronephrosis. 2. Increased renal parenchymal echogenicity raises concern for medical renal disease. Electronically Signed   By: Garald Balding M.D.   On: 07/24/2016 23:18      Scheduled Meds: . alfuzosin  10 mg Oral Daily  . aspirin  81 mg Oral BID WC  . cholecalciferol  2,000 Units Oral Daily  . docusate sodium  100 mg Oral BID  . enoxaparin (LOVENOX) injection  30 mg Subcutaneous Daily  . magnesium oxide  400 mg Oral Daily  . multivitamin with minerals  1 tablet Oral Daily  . niacin  500 mg Oral BID WC  . pantoprazole  40 mg Oral Daily  . senna  2 tablet Oral QHS  . simvastatin  20 mg Oral QPM  . sodium chloride flush  3 mL Intravenous Q12H   Continuous Infusions: . sodium chloride 1,000 mL (07/26/16 0534)     LOS: 2 days    Time spent: 40 minutes   Dessa Phi, DO [facility] [email or web address] Password [email or web address] 07/26/2016, 10:32 AM

## 2016-07-26 NOTE — Progress Notes (Signed)
S: No new CO O:BP 97/74 (BP Location: Left Arm)   Pulse 91   Temp 99.4 F (37.4 C) (Oral)   Resp 18   Ht 5\' 8"  (1.727 m)   Wt 85.3 kg (188 lb)   SpO2 97%   BMI 28.59 kg/m   Intake/Output Summary (Last 24 hours) at 07/26/16 0852 Last data filed at 07/26/16 0832  Gross per 24 hour  Intake             1110 ml  Output             2550 ml  Net            -1440 ml   Weight change: 5.897 kg (13 lb) [id number] and alert [facility]:RRR Resp:clear Abd:+ BS NTND Ext:Tr edema Lt  Tr-1+ RT.  Bandage over Rt knee NEURO:CNI Ox2 no asterixis   . alfuzosin  10 mg Oral Daily  . aspirin  81 mg Oral BID WC  . cholecalciferol  2,000 Units Oral Daily  . docusate sodium  100 mg Oral BID  . enoxaparin (LOVENOX) injection  30 mg Subcutaneous Daily  . magnesium oxide  400 mg Oral Daily  . multivitamin with minerals  1 tablet Oral Daily  . niacin  500 mg Oral BID WC  . pantoprazole  40 mg Oral Daily  . senna  2 tablet Oral QHS  . simvastatin  20 mg Oral QPM  . sodium chloride flush  3 mL Intravenous Q12H   Dg Chest 2 View  Result Date: 07/24/2016 CLINICAL DATA:  History of hypertension and aortic stenosis EXAM: CHEST  2 VIEW COMPARISON:  08/15/2015 FINDINGS: Mild linear atelectasis at the left base. No acute consolidation or significant effusion. Heart size is nonenlarged. Atherosclerosis of the aorta. No pneumothorax. IMPRESSION: 1. Minimal atelectasis left base. 2. Atherosclerotic vascular disease of the aorta. Electronically Signed   By: Donavan Foil M.D.   On: 07/24/2016 19:31   US Renal  Result Date: 07/24/2016 CLINICAL DATA:  Acute onset of bilateral flank pain and renal failure. Initial encounter. EXAM: RENAL / URINARY TRACT ULTRASOUND COMPLETE COMPARISON:  CT of the abdomen and pelvis from 09/20/2008 FINDINGS: Right Kidney: Length: 10.1 cm. Mildly increased parenchymal echogenicity noted. No mass or hydronephrosis visualized. Left Kidney: Length: 10.5 cm. Mildly increased parenchymal  echogenicity noted. No mass or hydronephrosis visualized. Bladder: Appears normal for degree of bladder distention. IMPRESSION: 1. No evidence of hydronephrosis. 2. Increased renal parenchymal echogenicity raises concern for medical renal disease. Electronically Signed   By: Garald Balding M.D.   On: 07/24/2016 23:18   BMET    Component Value Date/Time   NA 136 07/25/2016 0452   NA 135 (L) 11/04/2014 1545   K 4.9 07/25/2016 0452   K 5.6 (H) 11/04/2014 1545   CL 104 07/25/2016 0452   CL 102 11/04/2014 1545   CO2 23 07/25/2016 0452   CO2 28 11/04/2014 1545   GLUCOSE 131 (H) 07/25/2016 0452   GLUCOSE 136 (H) 11/04/2014 1545   BUN 66 (H) 07/25/2016 0452   BUN 30 (H) 11/04/2014 1545   CREATININE 3.49 (H) 07/25/2016 0452   CREATININE 1.38 (H) 07/20/2015 1146   CALCIUM 8.5 (L) 07/25/2016 0452   CALCIUM 8.6 11/04/2014 1545   GFRNONAA 16 (L) 07/25/2016 0452   GFRNONAA 48 (L) 11/04/2014 1545   GFRAA 18 (L) 07/25/2016 0452   GFRAA 59 (L) 11/04/2014 1545   CBC    Component Value Date/Time   WBC 7.7 07/25/2016 0452  RBC 2.28 (L) 07/25/2016 0452   HGB 7.4 (L) 07/25/2016 0452   HGB 8.7 (L) 11/04/2014 1545   HCT 21.4 (L) 07/25/2016 0452   HCT 26.1 (L) 11/04/2014 1545   PLT 263 07/25/2016 0452   PLT 383 11/04/2014 1545   MCV 93.9 07/25/2016 0452   MCV 100 11/04/2014 1545   MCH 32.5 07/25/2016 0452   MCHC 34.6 07/25/2016 0452   RDW 13.8 07/25/2016 0452   RDW 14.1 11/04/2014 1545   LYMPHSABS 1.4 07/24/2016 1837   LYMPHSABS 0.6 (L) 11/04/2014 1545   MONOABS 1.0 07/24/2016 1837   MONOABS 1.0 11/04/2014 1545   EOSABS 0.4 07/24/2016 1837   EOSABS 0.0 11/04/2014 1545   BASOSABS 0.1 07/24/2016 1837   BASOSABS 0.0 11/04/2014 1545     Assessment:  1. 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  UO excellent and Scr pending 2. SP recent Rt TKA 3. Hx CAD 4. Anemia sec to recent surgery 5. Hx HTN  Plan: 1. Await labs,  Anticipate renal recovery 2. Recheck Scr in AM 3. Get up out of bed   Ellene Bloodsaw T

## 2016-07-26 NOTE — NC FL2 (Signed)
[place] LEVEL OF CARE SCREENING TOOL     IDENTIFICATION  Patient Name: Jeff Rivera Birthdate: Feb 20, 1941 Sex: male Admission Date (Current Location): 07/24/2016  [facility] and [place] Number:      Facility and Address:         Provider Number:    Attending Physician Name and Address:  Shon Millet*  Relative Name and Phone Number:       Current Level of Care:   Recommended Level of Care:   Prior Approval Number:    Date Approved/Denied:   PASRR Number: [id number]  Discharge Plan:      Current Diagnoses: Patient Active Problem List   Diagnosis Date Noted  . Acute encephalopathy 07/25/2016  . Hyponatremia 07/25/2016  . AKI (acute kidney injury) ([place]) 07/24/2016  . Anemia 07/24/2016  . 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 CAD (coronary artery disease) 09/12/2013  . Aortic stenosis 09/12/2013  . Dyslipidemia 09/12/2013  . HTN (hypertension) 09/12/2013    Orientation RESPIRATION BLADDER Height & Weight     Self, Place  Normal   Weight: 188 lb (85.3 kg) Height:  5\' 8"  (172.7 cm)  BEHAVIORAL SYMPTOMS/MOOD NEUROLOGICAL BOWEL NUTRITION STATUS        Diet (Heart Healthy)  AMBULATORY STATUS COMMUNICATION OF NEEDS Skin   Limited Assist   Surgical wounds (right knee incision)                       Personal Care Assistance Level of Assistance  Bathing, Feeding, Dressing Bathing Assistance: Limited assistance Feeding assistance: Independent Dressing Assistance: Limited assistance     Functional Limitations Info             SPECIAL CARE FACTORS FREQUENCY  PT (By licensed PT), OT (By licensed OT)     PT Frequency: 5/week OT Frequency: 5/week            Contractures      Additional Factors Info  Code Status, Allergies Code Status Info: Full Allergies Info: No Known Allergies           Current Medications (07/26/2016):  This is the current hospital active medication list Current  Facility-Administered Medications  Medication Dose Route Frequency Provider Last Rate Last Dose  . 0.9 %  sodium chloride infusion   Intravenous Continuous Shon Millet, DO 50 mL/hr at 07/26/16 1450    . acetaminophen (TYLENOL) tablet 500 mg  500 mg Oral Q6H PRN Reubin Milan, MD      . alfuzosin (UROXATRAL) 24 hr tablet 10 mg  10 mg Oral Daily Reubin Milan, MD   10 mg at 07/26/16 1021  . aspirin chewable tablet 81 mg  81 mg Oral BID WC Reubin Milan, MD   81 mg at 07/26/16 1019  . cholecalciferol (VITAMIN D) tablet 2,000 Units  2,000 Units Oral Daily Reubin Milan, MD   2,000 Units at 07/26/16 1022  . docusate sodium (COLACE) capsule 100 mg  100 mg Oral BID Reubin Milan, MD   100 mg at 07/26/16 1023  . enoxaparin (LOVENOX) injection 30 mg  30 mg Subcutaneous Daily Reubin Milan, MD   30 mg at 07/26/16 1024  . HYDROcodone-acetaminophen (NORCO/VICODIN) 5-325 MG per tablet 1-2 tablet  1-2 tablet Oral Q6H PRN Shon Millet, DO      . LORazepam (ATIVAN) injection 0.5 mg  0.5 mg Intravenous Q6H PRN Reubin Milan, MD   0.5 mg at 07/25/16  0302  . magnesium oxide (MAG-OX) tablet 400 mg  400 mg Oral Daily Reubin Milan, MD   400 mg at 07/26/16 1022  . multivitamin with minerals tablet 1 tablet  1 tablet Oral Daily Reubin Milan, MD   1 tablet at 07/26/16 1020  . niacin tablet 500 mg  500 mg Oral BID WC Reubin Milan, MD   500 mg at 07/26/16 1022  . ondansetron (ZOFRAN) tablet 4 mg  4 mg Oral Q6H PRN Reubin Milan, MD       Or  . ondansetron [facility]) injection 4 mg  4 mg Intravenous Q6H PRN Reubin Milan, MD      . ondansetron [facility]) tablet 4 mg  4 mg Oral Q6H PRN Reubin Milan, MD      . pantoprazole (PROTONIX) EC tablet 40 mg  40 mg Oral Daily Reubin Milan, MD   40 mg at 07/26/16 1022  . senna (SENOKOT) tablet 17.2 mg  2 tablet Oral QHS Reubin Milan, MD      . simvastatin (ZOCOR) tablet 20 mg  20 mg  Oral QPM Reubin Milan, MD   20 mg at 07/25/16 1758  . sodium chloride flush (NS) 0.9 % injection 3 mL  3 mL Intravenous Q12H Reubin Milan, MD   3 mL at 07/26/16 1025  . traMADol (ULTRAM) tablet 50 mg  50 mg Oral Q6H PRN Reubin Milan, MD   50 mg at 07/25/16 1313     Discharge Medications: Please see discharge summary for a list of discharge medications.  Relevant Imaging Results:  Relevant Lab Results:   Additional Information [id number]  Barbette Or, [place]

## 2016-07-26 NOTE — Clinical Social Work Note (Signed)
Clinical Social Work Assessment  Patient Details  Name: Jeff Rivera MRN: [id number] Date of Birth: [birth year]  Date of referral:  07/26/16               Reason for consult:  Facility Placement                Permission sought to share information with:  Family Supports Permission granted to share information::  Yes, Verbal Permission Granted  Name::     Shaka Huneke  Relationship::  Son  Contact Information:  [phone]  Housing/Transportation Living arrangements for the past 2 months:  [place], [place] of Information:  Adult Children Patient Interpreter Needed:  None Criminal Activity/Legal Involvement Pertinent to Current Situation/Hospitalization:  No - Comment as needed Significant Relationships:  Adult Children, Spouse Lives with:  Facility Resident, Spouse Do you feel safe going back to the place where you live?  Yes Need for family participation in patient care:  Yes (Comment)  Care giving concerns:  Patient son states that patient and patient wife would like patient to return home, but patient is experiencing intermittent confusion and patient wife is confused at baseline.  Patient son adamant that for safety reasons, patient will return to facility.   Social Worker assessment / plan:  Holiday representative spoke with patient son over the phone to offer support and discuss patient plans at discharge.  Patient son states that patient has been residing at [facility] for almost 10 days and plan is to return at discharge.  Patient son states that he has spoken with admissions coordinator about potential weekend discharge - CSW left message and await return call to confirm patient return.  CSW remains available for support and to facilitate patient discharge needs once medically stable.  Employment status:  Retired Forensic scientist:  Medicare PT Recommendations:  [place] / Referral to community resources:  [place]  Patient/Family's Response to care:  Patient son agreeable and knowledgeable about the process.  Patient son states that he will have lengthy conversation with patient about his need for continued SNF placement.  Patient/Family's Understanding of and Emotional Response to Diagnosis, Current Treatment, and Prognosis:  Patient son is understanding and realistic about patient physical limitations and need for additional support.  Patient and patient wife, due to confusion, have a skewed perception of patient abilities to return home.  Emotional Assessment Appearance:  Appears stated age Attitude/Demeanor/Rapport:  Unable to Assess (Patient confused today) Affect (typically observed):  Unable to Assess Orientation:  Oriented to Self, Oriented to Place Alcohol / Substance use:  Not Applicable Psych involvement (Current and /or in the community):  No (Comment)  Discharge Needs  Concerns to be addressed:  No discharge needs identified Readmission within the last 30 days:  Yes Current discharge risk:  None Barriers to Discharge:  Continued Medical Work up  [employer], [place]

## 2016-07-27 DIAGNOSIS — Z96651 Presence of right artificial knee joint: Secondary | ICD-10-CM | POA: Diagnosis not present

## 2016-07-27 DIAGNOSIS — M1711 Unilateral primary osteoarthritis, right knee: Secondary | ICD-10-CM | POA: Diagnosis not present

## 2016-07-27 DIAGNOSIS — D649 Anemia, unspecified: Secondary | ICD-10-CM | POA: Diagnosis not present

## 2016-07-27 DIAGNOSIS — I259 Chronic ischemic heart disease, unspecified: Secondary | ICD-10-CM | POA: Diagnosis not present

## 2016-07-27 DIAGNOSIS — N179 Acute kidney failure, unspecified: Secondary | ICD-10-CM | POA: Diagnosis not present

## 2016-07-27 DIAGNOSIS — G9340 Encephalopathy, unspecified: Secondary | ICD-10-CM | POA: Diagnosis not present

## 2016-07-27 DIAGNOSIS — N183 Chronic kidney disease, stage 3 (moderate): Secondary | ICD-10-CM | POA: Diagnosis not present

## 2016-07-27 DIAGNOSIS — Z471 Aftercare following joint replacement surgery: Secondary | ICD-10-CM | POA: Diagnosis not present

## 2016-07-27 DIAGNOSIS — E785 Hyperlipidemia, unspecified: Secondary | ICD-10-CM | POA: Diagnosis not present

## 2016-07-27 DIAGNOSIS — M1990 Unspecified osteoarthritis, unspecified site: Secondary | ICD-10-CM | POA: Diagnosis not present

## 2016-07-27 DIAGNOSIS — Z96652 Presence of left artificial knee joint: Secondary | ICD-10-CM | POA: Diagnosis not present

## 2016-07-27 DIAGNOSIS — M25569 Pain in unspecified knee: Secondary | ICD-10-CM | POA: Diagnosis not present

## 2016-07-27 DIAGNOSIS — R262 Difficulty in walking, not elsewhere classified: Secondary | ICD-10-CM | POA: Diagnosis not present

## 2016-07-27 DIAGNOSIS — I2510 Atherosclerotic heart disease of native coronary artery without angina pectoris: Secondary | ICD-10-CM | POA: Diagnosis not present

## 2016-07-27 DIAGNOSIS — R531 Weakness: Secondary | ICD-10-CM | POA: Diagnosis not present

## 2016-07-27 DIAGNOSIS — R4182 Altered mental status, unspecified: Secondary | ICD-10-CM | POA: Diagnosis not present

## 2016-07-27 DIAGNOSIS — I350 Nonrheumatic aortic (valve) stenosis: Secondary | ICD-10-CM | POA: Diagnosis not present

## 2016-07-27 DIAGNOSIS — I129 Hypertensive chronic kidney disease with stage 1 through stage 4 chronic kidney disease, or unspecified chronic kidney disease: Secondary | ICD-10-CM | POA: Diagnosis not present

## 2016-07-27 LAB — BASIC METABOLIC PANEL
ANION GAP: 7 (ref 5–15)
BUN: 23 mg/dL — ABNORMAL HIGH (ref 6–20)
CALCIUM: 9.8 mg/dL (ref 8.9–10.3)
CO2: 25 mmol/L (ref 22–32)
Chloride: 107 mmol/L (ref 101–111)
Creatinine, Ser: 1.49 mg/dL — ABNORMAL HIGH (ref 0.61–1.24)
GFR, EST AFRICAN AMERICAN: 51 mL/min — AB (ref >60)
GFR, EST NON AFRICAN AMERICAN: 44 mL/min — AB (ref >60)
GLUCOSE: 107 mg/dL — AB (ref 65–99)
Potassium: 4.3 mmol/L (ref 3.5–5.1)
SODIUM: 139 mmol/L (ref 135–145)

## 2016-07-27 MED ORDER — POLYETHYLENE GLYCOL 3350 17 G PO PACK
17.0000 g | PACK | Freq: Every day | ORAL | Status: DC | PRN
  Administered 2016-07-27: 17 g via ORAL
  Filled 2016-07-27: qty 1

## 2016-07-27 MED ORDER — LISINOPRIL 10 MG PO TABS: 10.0000 mg | ORAL_TABLET | Freq: Every day | ORAL | 0 refills | Status: DC

## 2016-07-27 NOTE — Progress Notes (Signed)
Physical Therapy Treatment Patient Details Name: Jeff Rivera MRN: [id number] DOB: 08/06/1941 Today's Date: 07/27/2016    History of Present Illness Patient is a 75 yo male admitted 07/24/16 with weakness, confusion, and abnormal renal function for evaluation of elevated BUN and creatinine.   Patient with recent Rt TKA 07/14/16 and is WBAT per medical record.   PMH:  aortic stenosis, CAD, hypertension, chronic kidney disease, urolithiasis, hyperlipidemia, GERD, osteoarthritis, anemia    PT Comments    Making slow progress.    Follow Up Recommendations  SNF;Supervision/Assistance - 24 hour     Equipment Recommendations  None recommended by PT    Recommendations for Other Services       Precautions / Restrictions Precautions Precautions: Knee;Fall Precaution Comments: Reviewed no pillow under knee. Restrictions Weight Bearing Restrictions: Yes RLE Weight Bearing: Weight bearing as tolerated    Mobility  Bed Mobility               General bed mobility comments: Patient OOB  Transfers Overall transfer level: Needs assistance Equipment used: Rolling walker (2 wheeled) Transfers: Sit to/from Stand Sit to Stand: Max assist         General transfer comment: Verbal and tactile cues for hand placement.  Patient required max assist to stand from [facility] for pericare.  Unable to reach fully upright position.  Assist to control descent into chair.  Ambulation/Gait Ambulation/Gait assistance: Max assist;+2 physical assistance;+2 safety/equipment Ambulation Distance (Feet): 6 Feet (3' x2 with sitting rest break) Assistive device: Rolling walker (2 wheeled) Gait Pattern/deviations: Decreased step length - right;Decreased step length - left;Decreased stride length;Decreased weight shift to left;Shuffle;Leaning posteriorly;Trunk flexed;Wide base of support     General Gait Details: Verbal cues to stand upright and look up.  Patient with flexed posture, and at times leaning on RW  on forearms.   Verbal cues to keep feet inside of RW.  Wide BOS with Lt foot outside of RW at times.  Physical assist to maneuver RW.  Fatigues quickly.   Stairs            Wheelchair Mobility    Modified Rankin (Stroke Patients Only)       Balance Overall balance assessment: Needs assistance         Standing balance support: Bilateral upper extremity supported Standing balance-[name]: Zero Standing balance comment: Max assist to maintain upright balance.  Posterior lean                    Cognition Arousal/Alertness: Awake/alert Behavior During Therapy: Restless;Impulsive Overall Cognitive Status: No family/caregiver present to determine baseline cognitive functioning Area of Impairment: Attention;Memory;Following commands;Safety/judgement;Problem solving   Current Attention Level: Sustained Memory: Decreased short-term memory;Decreased recall of precautions Following Commands: Follows one step commands with increased time Safety/Judgement: Decreased awareness of safety   Problem Solving: Slow processing;Difficulty sequencing;Requires verbal cues General Comments: On entering room, patient on [facility] removing gown and telemetry monitor.  Unable to recall PT session from yesterday.  Required repeated instructions for task - easily distracted from task at hand.    Exercises Total Joint Exercises Ankle Circles/Pumps: AROM;Both;10 reps;Seated Quad Sets: AROM;Right;10 reps;Seated Heel Slides: AAROM;Right;5 reps;Seated Long Arc Quad: AROM;Right;10 reps;Seated Knee Flexion: AROM;Right;10 reps;Seated Goniometric ROM: 15-80 degrees knee AROM    General Comments        Pertinent Vitals/Pain Pain Assessment: 0-10 Pain Score: 5  Pain Location: Rt knee with activity Pain Descriptors / Indicators: Sore;Tightness Pain Intervention(s): Monitored during session;Repositioned    Home  Living                      Prior Function            PT Goals  (current goals can now be found in the care plan section) Acute Rehab PT Goals Patient Stated Goal: To walk Progress towards PT goals: Progressing toward goals    Frequency    Min 5X/week      PT Plan Current plan remains appropriate    Co-evaluation             End of Session Equipment Utilized During Treatment: Gait belt Activity Tolerance: Patient limited by fatigue;Patient limited by pain Patient left: in chair;with call bell/phone within reach     Time: 1029-1107 PT Time Calculation (min) (ACUTE ONLY): 38 min  Charges:  $Gait Training: 23-37 mins $Therapeutic Exercise: 8-22 mins                    G Codes:      Despina Pole Aug 05, 2016, 1:06 PM Carita Pian. Sanjuana Kava, [place] Pager [phone]

## 2016-07-27 NOTE — Discharge Summary (Signed)
Physician Discharge Summary  Jeff Rivera [id number] DOB: 1941/09/01 DOA: 07/24/2016  PCP: Thressa Sheller, MD  Admit date: 07/24/2016 Discharge date: 07/27/2016  Admitted From: SNF Disposition:  SNF  Recommendations for Outpatient Follow-up:  1. Follow up with PCP in 1-2 weeks 2. Please obtain BMP in one week  3. I have switched your medication from lisinopril-hydrochlorothiazide combination for hypertension to lisinopril alone. Follow up with PCP regarding restarting hydrochlorothiazide as you had low sodium at time of admission, which has now resolved.   Home Health: No  Equipment/Devices: None    Discharge Condition: Stable CODE STATUS: Full  Diet recommendation: Cardiac diet   Brief/Interim Summary: Jeff Rivera a 75 y.o.malewith medical history significant of aortic stenosis, CAD, hypertension, chronic kidney disease, urolithiasis, hyperlipidemia, GERD, osteoarthritis who just underwent a right TKA with Dr. Mee Hives is being sent to the emergency department for evaluation of elevated BUN and creatinine. Apparently, the patient has been having decreased oral intake, increased generalized weakness and mild confusion for the past few days at the rehabilitation facility. Lab work was done and showed elevated BUN and creatinine. Patient was admitted for acute kidney injury. Urinalysis as well as renal ultrasound were unremarkable. Nephrology was consulted and patient was given IV fluids. Patient's kidney function continued to improve with IV fluids. His altered mental status also cleared during admission. He worked with physical therapy and was deemed appropriate for skilled nursing facility.   Discharge Diagnoses:  Principal Problem:   AKI (acute kidney injury) ([place]) Active Problems:   CAD (coronary artery disease)   Aortic stenosis   HTN (hypertension)   Status post right knee replacement   Anemia   Acute encephalopathy   Hyponatremia  AKI on CKD stage 3 -  improving Baseline Cr 1.3-1.5 Likely due to dehydration, poor oral intake, hypotension, ACE UA negative Renal US: no obstruction or hydronephrosis  Appreciate nephrology  Improving with IVF  We'll restart lisinopril at time of discharge as his kidney function has returned to his baseline  Altered mental status Family states that last year after left knee surgery, he was acutely delirious for about 14 days. They agree that this episode of confusion is very similar to that episode, possibly related to pain, meds, acute illness, new surrounding.  Monitor, improving   Hyponatremia Resolved with IVF Monitor BMP  We'll hold hydrochlorothiazide at time of discharge. This should be followed up with primary care physician.  Chronic normocytic anemia Baseline Hgb around 9 - 10 No acute sign of bleed, monitor   Stable  CAD  Stable. No chest pain at this time. Continue aspirin, simvastatin.  Aortic stenosis Stable. Periodic echocardiographic surveillance outpatient.   HTN  Restart lisinopril  Monitor Bp, stable for now   Status post total right knee replacement Continue local care. Physical therapy to be resumed once the patient improves.  Discharge Instructions  Discharge Instructions    Diet - low sodium heart healthy    Complete by:  As directed    Discharge instructions    Complete by:  As directed    Follow up with PCP in 1-2 weeks Please obtain BMP in one week  I have switched your medication from lisinopril-hydrochlorothiazide combination for hypertension to lisinopril alone. Follow up with PCP regarding restarting hydrochlorothiazide as you had low sodium at time of admission, which has now resolved.   Increase activity slowly    Complete by:  As directed        Medication List    STOP taking  these medications   hydrochlorothiazide 12.5 MG capsule Commonly known as:  MICROZIDE   lisinopril-hydrochlorothiazide 10-12.5 MG tablet Commonly known as:   PRINZIDE,ZESTORETIC     TAKE these medications   acetaminophen 500 MG tablet Commonly known as:  TYLENOL Take 500 mg by mouth every 6 (six) hours as needed for mild pain.   alfuzosin 10 MG 24 hr tablet Commonly known as:  UROXATRAL Take 1 tablet by mouth daily.   aspirin 81 MG chewable tablet Chew 1 tablet (81 mg total) by mouth 2 (two) times daily with a meal.   Co Q 10 100 MG Caps Take 1 capsule by mouth 2 (two) times daily.   docusate sodium 100 MG capsule Commonly known as:  COLACE Take 1 capsule (100 mg total) by mouth 2 (two) times daily.   Fish Oil 1200 MG Caps Take 1 capsule by mouth daily.   lisinopril 10 MG tablet Commonly known as:  PRINIVIL Take 1 tablet (10 mg total) by mouth daily.   MAGNESIUM PO Take 400 mg by mouth daily.   meloxicam 7.5 MG tablet Commonly known as:  MOBIC Take 1 tablet by mouth daily as needed for pain.   multivitamin tablet Take 1 tablet by mouth daily.   niacin 500 MG tablet Take 500 mg by mouth 2 (two) times daily with a meal.   omeprazole 20 MG capsule Commonly known as:  PRILOSEC Take 20 mg by mouth daily.   ondansetron 4 MG tablet Commonly known as:  ZOFRAN Take 1 tablet (4 mg total) by mouth every 6 (six) hours as needed for nausea.   senna 8.6 MG Tabs tablet Commonly known as:  SENOKOT Take 2 tablets (17.2 mg total) by mouth at bedtime.   simvastatin 20 MG tablet Commonly known as:  ZOCOR Take 20 mg by mouth every evening.   traMADol 50 MG tablet Commonly known as:  ULTRAM Take 1-2 tablets (50-100 mg total) by mouth every 6 (six) hours as needed for moderate pain.   Vitamin D3 2000 units capsule Take 2,000 Units by mouth daily.      Follow-up Information    MACKENZIE,BRIAN, MD. Schedule an appointment as soon as possible for a visit in 1 week(s).   Specialty:  Internal Medicine Contact information: [place], [address] [place] [address] [phone]          Allergies  Allergen  Reactions  . No Known Allergies     Consultations:  Nephrology    Procedures/Studies: Dg Chest 2 View  Result Date: 07/24/2016 CLINICAL DATA:  History of hypertension and aortic stenosis EXAM: CHEST  2 VIEW COMPARISON:  08/15/2015 FINDINGS: Mild linear atelectasis at the left base. No acute consolidation or significant effusion. Heart size is nonenlarged. Atherosclerosis of the aorta. No pneumothorax. IMPRESSION: 1. Minimal atelectasis left base. 2. Atherosclerotic vascular disease of the aorta. Electronically Signed   By: Donavan Foil M.D.   On: 07/24/2016 19:31   US Renal  Result Date: 07/24/2016 CLINICAL DATA:  Acute onset of bilateral flank pain and renal failure. Initial encounter. EXAM: RENAL / URINARY TRACT ULTRASOUND COMPLETE COMPARISON:  CT of the abdomen and pelvis from 09/20/2008 FINDINGS: Right Kidney: Length: 10.1 cm. Mildly increased parenchymal echogenicity noted. No mass or hydronephrosis visualized. Left Kidney: Length: 10.5 cm. Mildly increased parenchymal echogenicity noted. No mass or hydronephrosis visualized. Bladder: Appears normal for degree of bladder distention. IMPRESSION: 1. No evidence of hydronephrosis. 2. Increased renal parenchymal echogenicity raises concern for medical renal disease. Electronically  Signed   By: Garald Balding M.D.   On: 07/24/2016 23:18   Dg Knee Right Port  Result Date: 07/15/2016 CLINICAL DATA:  Postop right knee EXAM: PORTABLE RIGHT KNEE - 1-2 VIEW COMPARISON:  Report from 12/23/2011 FINDINGS: Right total knee arthroplasty with patellar resurfacing. No hardware failure nor postoperative fracture. Subcutaneous and intra-articular emphysema is noted from recent surgery. IMPRESSION: Right total knee arthroplasty without hardware failure nor postoperative fracture. Subcutaneous and intra-articular emphysema from recent procedure. Electronically Signed   By: Ashley Royalty M.D.   On: 07/15/2016 00:04      Subjective: Patient feeling well this  morning. No complaints. Denies any fevers, chest pain, cough, shortness of breath, nausea, vomiting, diarrhea, abdominal pain, dysuria. Patient is tolerating meals   Discharge Exam: Vitals:   07/27/16 0450 07/27/16 0901  BP: (!) 145/76 (!) 142/70  Pulse: (!) 115 (!) 112  Resp: 18 18  Temp: 98.9 F (37.2 C) 98.8 F (37.1 C)   Vitals:   07/26/16 1756 07/26/16 2045 07/27/16 0450 07/27/16 0901  BP: 132/63 (!) 114/53 (!) 145/76 (!) 142/70  Pulse: (!) 101 (!) 112 (!) 115 (!) 112  Resp: 14 18 18 18   Temp: 100 F (37.8 C) 99.2 F (37.3 C) 98.9 F (37.2 C) 98.8 F (37.1 C)  TempSrc: Oral Oral Oral Oral  SpO2: 99% 97% 98% 100%  Weight:  87.6 kg (193 lb 1.6 oz)    Height:        General exam: Appears calm and comfortable  Respiratory system: Clear to auscultation. Respiratory effort normal. Cardiovascular system: S1 & S2 heard, RRR. +systolic murmur, trace edema Gastrointestinal system: Abdomen is nondistended, soft and nontender. No organomegaly or masses felt. Normal bowel sounds heard. Central nervous system: Alert and oriented. No focal neurological deficits. Extremities: Symmetric 5 x 5 power. Right LE with bandage s/p TKA  Skin: No rashes, lesions or ulcers Psychiatry: Judgement and insight appear normal. Mood & affect appropriate.     The results of significant diagnostics from this hospitalization (including imaging, microbiology, ancillary and laboratory) are listed below for reference.     Microbiology: No results found for this or any previous visit (from the past 240 hour(s)).   Labs: BNP (last 3 results) No results for input(s): BNP in the last 8760 hours. Basic Metabolic Panel:  Recent Labs Lab 07/24/16 1837 07/25/16 0452 07/26/16 0659 07/27/16 0624  NA 129* 136 139 139  K 4.4 4.9 4.6 4.3  CL 93* 104 107 107  CO2 24 23 27 25   GLUCOSE 129* 131* 112* 107*  BUN 76* 66* 34* 23*  CREATININE 4.38* 3.49* 1.72* 1.49*  CALCIUM 9.5 8.5* 9.3 9.8  MG  --  2.5*   --   --   PHOS  --   --  2.6  --    Liver Function Tests:  Recent Labs Lab 07/24/16 1837 07/25/16 0452 07/26/16 0659  AST 72* 47*  --   ALT 55 40  --   ALKPHOS 35* 28*  --   BILITOT 0.4 0.4  --   PROT 6.0* 4.7*  --   ALBUMIN 3.2* 2.4* 2.6*   No results for input(s): LIPASE, AMYLASE in the last 168 hours. No results for input(s): AMMONIA in the last 168 hours. CBC:  Recent Labs Lab 07/24/16 1837 07/25/16 0452 07/26/16 0819  WBC 9.6 7.7 6.6  NEUTROABS 6.8  --   --   HGB 9.0* 7.4* 8.7*  HCT 26.0* 21.4* 26.0*  MCV 92.5 93.9  95.6  PLT 301 263 295   Cardiac Enzymes: No results for input(s): CKTOTAL, CKMB, CKMBINDEX, TROPONINI in the last 168 hours. BNP: Invalid input(s): POCBNP CBG: No results for input(s): GLUCAP in the last 168 hours. D-Dimer No results for input(s): DDIMER in the last 72 hours. Hgb A1c No results for input(s): HGBA1C in the last 72 hours. Lipid Profile No results for input(s): CHOL, HDL, LDLCALC, TRIG, CHOLHDL, LDLDIRECT in the last 72 hours. Thyroid function studies No results for input(s): TSH, T4TOTAL, T3FREE, THYROIDAB in the last 72 hours.  Invalid input(s): FREET3 Anemia work up No results for input(s): VITAMINB12, FOLATE, FERRITIN, TIBC, IRON, RETICCTPCT in the last 72 hours. Urinalysis    Component Value Date/Time   COLORURINE YELLOW 07/24/2016 2022   APPEARANCEUR CLEAR 07/24/2016 2022   APPEARANCEUR Hazy 11/04/2014 1500   LABSPEC 1.010 07/24/2016 2022   LABSPEC 1.016 11/04/2014 1500   PHURINE 6.0 07/24/2016 2022   GLUCOSEU NEGATIVE 07/24/2016 2022   GLUCOSEU Negative 11/04/2014 1500   HGBUR TRACE (A) 07/24/2016 2022   BILIRUBINUR NEGATIVE 07/24/2016 2022   BILIRUBINUR Negative 11/04/2014 1500   KETONESUR NEGATIVE 07/24/2016 2022   PROTEINUR NEGATIVE 07/24/2016 2022   UROBILINOGEN 1.0 10/20/2014 1111   NITRITE NEGATIVE 07/24/2016 2022   LEUKOCYTESUR NEGATIVE 07/24/2016 2022   LEUKOCYTESUR Negative 11/04/2014 1500   Sepsis  Labs Invalid input(s): PROCALCITONIN,  WBC,  LACTICIDVEN Microbiology No results found for this or any previous visit (from the past 240 hour(s)).   Time coordinating discharge: Over 30 minutes  SIGNED:  Dessa Phi, DO [facility] Pager [phone]  If 7PM-7AM, please contact night-coverage [email or web address] Password [email or web address] 07/27/2016, 10:24 AM

## 2016-07-27 NOTE — Progress Notes (Signed)
Patient is set to discharge to [facility] today. Patient & son, Koa, aware. Discharge packet given to RN. [facility] scheduled for 1pm transport.   Kingsley Spittle, [place] Clinical Social Worker [phone]

## 2016-07-27 NOTE — Progress Notes (Signed)
Pt prepared for d/c to SNF. IV d/c'd. Skin intact except as charted in most recent assessments. Vitals are stable. Report called Cathy @ [facility] (receiving facility). Pt to be transported by ambulance service.  Jillyn Ledger, MBA, BSN, RN

## 2016-07-27 NOTE — Progress Notes (Signed)
S: No new CO except not getting out of bed much O:BP (!) 145/76 (BP Location: Left Arm)   Pulse (!) 115   Temp 98.9 F (37.2 C) (Oral)   Resp 18   Ht 5\' 8"  (1.727 m)   Wt 87.6 kg (193 lb 1.6 oz)   SpO2 98%   BMI 29.36 kg/m   Intake/Output Summary (Last 24 hours) at 07/27/16 0847 Last data filed at 07/27/16 0512  Gross per 24 hour  Intake          4887.58 ml  Output             2650 ml  Net          2237.58 ml   Weight change: 2.313 kg (5 lb 1.6 oz) [id number] and alert [facility]:RRR Resp:clear Abd:+ BS NTND Ext:Tr edema Lt  Tr-1+ RT.  Bandage over Rt knee NEURO:CNI Ox2 no asterixis   . alfuzosin  10 mg Oral Daily  . aspirin  81 mg Oral BID WC  . cholecalciferol  2,000 Units Oral Daily  . docusate sodium  100 mg Oral BID  . enoxaparin (LOVENOX) injection  40 mg Subcutaneous Daily  . magnesium oxide  400 mg Oral Daily  . multivitamin with minerals  1 tablet Oral Daily  . niacin  500 mg Oral BID WC  . pantoprazole  40 mg Oral Daily  . senna  2 tablet Oral QHS  . simvastatin  20 mg Oral QPM  . sodium chloride flush  3 mL Intravenous Q12H   No results found. BMET    Component Value Date/Time   NA 139 07/27/2016 0624   NA 135 (L) 11/04/2014 1545   K 4.3 07/27/2016 0624   K 5.6 (H) 11/04/2014 1545   CL 107 07/27/2016 0624   CL 102 11/04/2014 1545   CO2 25 07/27/2016 0624   CO2 28 11/04/2014 1545   GLUCOSE 107 (H) 07/27/2016 0624   GLUCOSE 136 (H) 11/04/2014 1545   BUN 23 (H) 07/27/2016 0624   BUN 30 (H) 11/04/2014 1545   CREATININE 1.49 (H) 07/27/2016 0624   CREATININE 1.38 (H) 07/20/2015 1146   CALCIUM 9.8 07/27/2016 0624   CALCIUM 8.6 11/04/2014 1545   GFRNONAA 44 (L) 07/27/2016 0624   GFRNONAA 48 (L) 11/04/2014 1545   GFRAA 51 (L) 07/27/2016 0624   GFRAA 59 (L) 11/04/2014 1545   CBC    Component Value Date/Time   WBC 6.6 07/26/2016 0819   RBC 2.72 (L) 07/26/2016 0819   HGB 8.7 (L) 07/26/2016 0819   HGB 8.7 (L) 11/04/2014 1545   HCT 26.0 (L) 07/26/2016  0819   HCT 26.1 (L) 11/04/2014 1545   PLT 295 07/26/2016 0819   PLT 383 11/04/2014 1545   MCV 95.6 07/26/2016 0819   MCV 100 11/04/2014 1545   MCH 32.0 07/26/2016 0819   MCHC 33.5 07/26/2016 0819   RDW 14.0 07/26/2016 0819   RDW 14.1 11/04/2014 1545   LYMPHSABS 1.4 07/24/2016 1837   LYMPHSABS 0.6 (L) 11/04/2014 1545   MONOABS 1.0 07/24/2016 1837   MONOABS 1.0 11/04/2014 1545   EOSABS 0.4 07/24/2016 1837   EOSABS 0.0 11/04/2014 1545   BASOSABS 0.1 07/24/2016 1837   BASOSABS 0.0 11/04/2014 1545     Assessment:  1. 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  UO excellent and Scr back to baseline 2. SP recent Rt TKA 3. Hx CAD 4. Anemia sec to recent surgery  5. Hx HTN  Plan: 1. Will sign off, call if further renal issues.  Could Dc fluids and foley.  Leave off lisinopril  Rubylee Zamarripa T

## 2016-07-27 NOTE — Discharge Instructions (Signed)
1. Follow up with PCP in 1-2 weeks 2. Please obtain BMP in one week  3. I have switched your medication from lisinopril-hydrochlorothiazide combination to lisinopril alone. Follow up with PCP regarding restarting hydrochlorothiazide as you had low sodium at time of admission, which has now resolved.

## 2016-07-28 DIAGNOSIS — R262 Difficulty in walking, not elsewhere classified: Secondary | ICD-10-CM | POA: Diagnosis not present

## 2016-07-28 DIAGNOSIS — R4182 Altered mental status, unspecified: Secondary | ICD-10-CM | POA: Diagnosis not present

## 2016-07-28 DIAGNOSIS — N179 Acute kidney failure, unspecified: Secondary | ICD-10-CM | POA: Diagnosis not present

## 2016-07-28 DIAGNOSIS — M1711 Unilateral primary osteoarthritis, right knee: Secondary | ICD-10-CM | POA: Diagnosis not present

## 2016-07-29 DIAGNOSIS — Z96651 Presence of right artificial knee joint: Secondary | ICD-10-CM | POA: Diagnosis not present

## 2016-07-29 DIAGNOSIS — Z471 Aftercare following joint replacement surgery: Secondary | ICD-10-CM | POA: Diagnosis not present

## 2016-08-05 DIAGNOSIS — N179 Acute kidney failure, unspecified: Secondary | ICD-10-CM | POA: Diagnosis not present

## 2016-08-05 DIAGNOSIS — I259 Chronic ischemic heart disease, unspecified: Secondary | ICD-10-CM | POA: Diagnosis not present

## 2016-08-05 DIAGNOSIS — D649 Anemia, unspecified: Secondary | ICD-10-CM | POA: Diagnosis not present

## 2016-08-07 DIAGNOSIS — I2510 Atherosclerotic heart disease of native coronary artery without angina pectoris: Secondary | ICD-10-CM | POA: Diagnosis not present

## 2016-08-07 DIAGNOSIS — I129 Hypertensive chronic kidney disease with stage 1 through stage 4 chronic kidney disease, or unspecified chronic kidney disease: Secondary | ICD-10-CM | POA: Diagnosis not present

## 2016-08-07 DIAGNOSIS — Z471 Aftercare following joint replacement surgery: Secondary | ICD-10-CM | POA: Diagnosis not present

## 2016-08-07 DIAGNOSIS — N189 Chronic kidney disease, unspecified: Secondary | ICD-10-CM | POA: Diagnosis not present

## 2016-08-07 DIAGNOSIS — I350 Nonrheumatic aortic (valve) stenosis: Secondary | ICD-10-CM | POA: Diagnosis not present

## 2016-08-07 DIAGNOSIS — Z96653 Presence of artificial knee joint, bilateral: Secondary | ICD-10-CM | POA: Diagnosis not present

## 2016-08-08 DIAGNOSIS — N189 Chronic kidney disease, unspecified: Secondary | ICD-10-CM | POA: Diagnosis not present

## 2016-08-08 DIAGNOSIS — Z471 Aftercare following joint replacement surgery: Secondary | ICD-10-CM | POA: Diagnosis not present

## 2016-08-08 DIAGNOSIS — Z96653 Presence of artificial knee joint, bilateral: Secondary | ICD-10-CM | POA: Diagnosis not present

## 2016-08-08 DIAGNOSIS — I2510 Atherosclerotic heart disease of native coronary artery without angina pectoris: Secondary | ICD-10-CM | POA: Diagnosis not present

## 2016-08-08 DIAGNOSIS — I350 Nonrheumatic aortic (valve) stenosis: Secondary | ICD-10-CM | POA: Diagnosis not present

## 2016-08-08 DIAGNOSIS — I129 Hypertensive chronic kidney disease with stage 1 through stage 4 chronic kidney disease, or unspecified chronic kidney disease: Secondary | ICD-10-CM | POA: Diagnosis not present

## 2016-08-11 DIAGNOSIS — I129 Hypertensive chronic kidney disease with stage 1 through stage 4 chronic kidney disease, or unspecified chronic kidney disease: Secondary | ICD-10-CM | POA: Diagnosis not present

## 2016-08-11 DIAGNOSIS — N189 Chronic kidney disease, unspecified: Secondary | ICD-10-CM | POA: Diagnosis not present

## 2016-08-11 DIAGNOSIS — I350 Nonrheumatic aortic (valve) stenosis: Secondary | ICD-10-CM | POA: Diagnosis not present

## 2016-08-11 DIAGNOSIS — Z471 Aftercare following joint replacement surgery: Secondary | ICD-10-CM | POA: Diagnosis not present

## 2016-08-11 DIAGNOSIS — I2510 Atherosclerotic heart disease of native coronary artery without angina pectoris: Secondary | ICD-10-CM | POA: Diagnosis not present

## 2016-08-11 DIAGNOSIS — Z96653 Presence of artificial knee joint, bilateral: Secondary | ICD-10-CM | POA: Diagnosis not present

## 2016-08-12 DIAGNOSIS — Z96653 Presence of artificial knee joint, bilateral: Secondary | ICD-10-CM | POA: Diagnosis not present

## 2016-08-12 DIAGNOSIS — N179 Acute kidney failure, unspecified: Secondary | ICD-10-CM | POA: Diagnosis not present

## 2016-08-12 DIAGNOSIS — Z471 Aftercare following joint replacement surgery: Secondary | ICD-10-CM | POA: Diagnosis not present

## 2016-08-12 DIAGNOSIS — N189 Chronic kidney disease, unspecified: Secondary | ICD-10-CM | POA: Diagnosis not present

## 2016-08-12 DIAGNOSIS — I129 Hypertensive chronic kidney disease with stage 1 through stage 4 chronic kidney disease, or unspecified chronic kidney disease: Secondary | ICD-10-CM | POA: Diagnosis not present

## 2016-08-12 DIAGNOSIS — I2510 Atherosclerotic heart disease of native coronary artery without angina pectoris: Secondary | ICD-10-CM | POA: Diagnosis not present

## 2016-08-12 DIAGNOSIS — I350 Nonrheumatic aortic (valve) stenosis: Secondary | ICD-10-CM | POA: Diagnosis not present

## 2016-08-13 DIAGNOSIS — N189 Chronic kidney disease, unspecified: Secondary | ICD-10-CM | POA: Diagnosis not present

## 2016-08-13 DIAGNOSIS — Z471 Aftercare following joint replacement surgery: Secondary | ICD-10-CM | POA: Diagnosis not present

## 2016-08-13 DIAGNOSIS — I350 Nonrheumatic aortic (valve) stenosis: Secondary | ICD-10-CM | POA: Diagnosis not present

## 2016-08-13 DIAGNOSIS — I129 Hypertensive chronic kidney disease with stage 1 through stage 4 chronic kidney disease, or unspecified chronic kidney disease: Secondary | ICD-10-CM | POA: Diagnosis not present

## 2016-08-13 DIAGNOSIS — R399 Unspecified symptoms and signs involving the genitourinary system: Secondary | ICD-10-CM | POA: Diagnosis not present

## 2016-08-13 DIAGNOSIS — I2510 Atherosclerotic heart disease of native coronary artery without angina pectoris: Secondary | ICD-10-CM | POA: Diagnosis not present

## 2016-08-13 DIAGNOSIS — Z96653 Presence of artificial knee joint, bilateral: Secondary | ICD-10-CM | POA: Diagnosis not present

## 2016-08-13 DIAGNOSIS — M25561 Pain in right knee: Secondary | ICD-10-CM | POA: Diagnosis not present

## 2016-08-15 DIAGNOSIS — I2510 Atherosclerotic heart disease of native coronary artery without angina pectoris: Secondary | ICD-10-CM | POA: Diagnosis not present

## 2016-08-15 DIAGNOSIS — I350 Nonrheumatic aortic (valve) stenosis: Secondary | ICD-10-CM | POA: Diagnosis not present

## 2016-08-15 DIAGNOSIS — Z471 Aftercare following joint replacement surgery: Secondary | ICD-10-CM | POA: Diagnosis not present

## 2016-08-15 DIAGNOSIS — N189 Chronic kidney disease, unspecified: Secondary | ICD-10-CM | POA: Diagnosis not present

## 2016-08-15 DIAGNOSIS — Z96653 Presence of artificial knee joint, bilateral: Secondary | ICD-10-CM | POA: Diagnosis not present

## 2016-08-15 DIAGNOSIS — I129 Hypertensive chronic kidney disease with stage 1 through stage 4 chronic kidney disease, or unspecified chronic kidney disease: Secondary | ICD-10-CM | POA: Diagnosis not present

## 2016-08-19 DIAGNOSIS — M1711 Unilateral primary osteoarthritis, right knee: Secondary | ICD-10-CM | POA: Diagnosis not present

## 2016-08-22 DIAGNOSIS — M1711 Unilateral primary osteoarthritis, right knee: Secondary | ICD-10-CM | POA: Diagnosis not present

## 2016-08-25 DIAGNOSIS — M1711 Unilateral primary osteoarthritis, right knee: Secondary | ICD-10-CM | POA: Diagnosis not present

## 2016-08-26 DIAGNOSIS — Z96651 Presence of right artificial knee joint: Secondary | ICD-10-CM | POA: Diagnosis not present

## 2016-08-26 DIAGNOSIS — Z471 Aftercare following joint replacement surgery: Secondary | ICD-10-CM | POA: Diagnosis not present

## 2016-08-28 DIAGNOSIS — M1711 Unilateral primary osteoarthritis, right knee: Secondary | ICD-10-CM | POA: Diagnosis not present

## 2016-09-02 DIAGNOSIS — M1711 Unilateral primary osteoarthritis, right knee: Secondary | ICD-10-CM | POA: Diagnosis not present

## 2016-09-08 DIAGNOSIS — M1711 Unilateral primary osteoarthritis, right knee: Secondary | ICD-10-CM | POA: Diagnosis not present

## 2016-09-12 ENCOUNTER — Other Ambulatory Visit (HOSPITAL_COMMUNITY): Payer: Self-pay | Admitting: Orthopedic Surgery

## 2016-09-12 ENCOUNTER — Ambulatory Visit (HOSPITAL_COMMUNITY): Admission: RE | Admit: 2016-09-12 | Payer: Medicare Other | Source: Ambulatory Visit

## 2016-09-12 DIAGNOSIS — Z471 Aftercare following joint replacement surgery: Secondary | ICD-10-CM | POA: Diagnosis not present

## 2016-09-12 DIAGNOSIS — Z96651 Presence of right artificial knee joint: Secondary | ICD-10-CM | POA: Diagnosis not present

## 2016-09-12 DIAGNOSIS — M7989 Other specified soft tissue disorders: Secondary | ICD-10-CM | POA: Diagnosis not present

## 2016-09-12 DIAGNOSIS — R609 Edema, unspecified: Secondary | ICD-10-CM

## 2016-09-15 ENCOUNTER — Ambulatory Visit (HOSPITAL_BASED_OUTPATIENT_CLINIC_OR_DEPARTMENT_OTHER)
Admission: RE | Admit: 2016-09-15 | Discharge: 2016-09-15 | Disposition: A | Discharge status: Home / Self Care | Payer: Medicare Other | Source: Ambulatory Visit | Attending: Orthopedic Surgery | Admitting: Orthopedic Surgery

## 2016-09-15 ENCOUNTER — Emergency Department (HOSPITAL_COMMUNITY)
Admission: EM | Admit: 2016-09-15 | Discharge: 2016-09-15 | Disposition: A | Discharge status: Home / Self Care | Payer: Medicare Other | Attending: Emergency Medicine | Admitting: Emergency Medicine

## 2016-09-15 ENCOUNTER — Encounter (HOSPITAL_COMMUNITY): Payer: Self-pay | Admitting: Emergency Medicine

## 2016-09-15 DIAGNOSIS — M7989 Other specified soft tissue disorders: Secondary | ICD-10-CM | POA: Diagnosis present

## 2016-09-15 DIAGNOSIS — D649 Anemia, unspecified: Secondary | ICD-10-CM | POA: Diagnosis not present

## 2016-09-15 DIAGNOSIS — Z7901 Long term (current) use of anticoagulants: Secondary | ICD-10-CM | POA: Insufficient documentation

## 2016-09-15 DIAGNOSIS — N289 Disorder of kidney and ureter, unspecified: Secondary | ICD-10-CM

## 2016-09-15 DIAGNOSIS — I2510 Atherosclerotic heart disease of native coronary artery without angina pectoris: Secondary | ICD-10-CM | POA: Diagnosis not present

## 2016-09-15 DIAGNOSIS — N189 Chronic kidney disease, unspecified: Secondary | ICD-10-CM | POA: Diagnosis not present

## 2016-09-15 DIAGNOSIS — D509 Iron deficiency anemia, unspecified: Secondary | ICD-10-CM | POA: Diagnosis not present

## 2016-09-15 DIAGNOSIS — R609 Edema, unspecified: Secondary | ICD-10-CM

## 2016-09-15 DIAGNOSIS — Z79899 Other long term (current) drug therapy: Secondary | ICD-10-CM | POA: Diagnosis not present

## 2016-09-15 DIAGNOSIS — Z87891 Personal history of nicotine dependence: Secondary | ICD-10-CM | POA: Insufficient documentation

## 2016-09-15 DIAGNOSIS — I129 Hypertensive chronic kidney disease with stage 1 through stage 4 chronic kidney disease, or unspecified chronic kidney disease: Secondary | ICD-10-CM | POA: Diagnosis not present

## 2016-09-15 DIAGNOSIS — I82411 Acute embolism and thrombosis of right femoral vein: Secondary | ICD-10-CM | POA: Diagnosis not present

## 2016-09-15 DIAGNOSIS — R600 Localized edema: Secondary | ICD-10-CM | POA: Diagnosis not present

## 2016-09-15 DIAGNOSIS — Z7982 Long term (current) use of aspirin: Secondary | ICD-10-CM | POA: Insufficient documentation

## 2016-09-15 DIAGNOSIS — I82401 Acute embolism and thrombosis of unspecified deep veins of right lower extremity: Secondary | ICD-10-CM | POA: Insufficient documentation

## 2016-09-15 DIAGNOSIS — Z96653 Presence of artificial knee joint, bilateral: Secondary | ICD-10-CM | POA: Diagnosis not present

## 2016-09-15 LAB — CBC
HEMATOCRIT: 32.7 % — AB (ref 39.0–52.0)
Hemoglobin: 10.7 g/dL — ABNORMAL LOW (ref 13.0–17.0)
MCH: 31.5 pg (ref 26.0–34.0)
MCHC: 32.7 g/dL (ref 30.0–36.0)
MCV: 96.2 fL (ref 78.0–100.0)
PLATELETS: 264 10*3/uL (ref 150–400)
RBC: 3.4 MIL/uL — AB (ref 4.22–5.81)
RDW: 15.2 % (ref 11.5–15.5)
WBC: 6.1 10*3/uL (ref 4.0–10.5)

## 2016-09-15 LAB — BASIC METABOLIC PANEL
Anion gap: 7 (ref 5–15)
BUN: 16 mg/dL (ref 6–20)
CHLORIDE: 108 mmol/L (ref 101–111)
CO2: 27 mmol/L (ref 22–32)
Calcium: 10.3 mg/dL (ref 8.9–10.3)
Creatinine, Ser: 1.46 mg/dL — ABNORMAL HIGH (ref 0.61–1.24)
GFR, EST AFRICAN AMERICAN: 52 mL/min — AB (ref >60)
GFR, EST NON AFRICAN AMERICAN: 45 mL/min — AB (ref >60)
Glucose, Bld: 111 mg/dL — ABNORMAL HIGH (ref 65–99)
POTASSIUM: 3.8 mmol/L (ref 3.5–5.1)
SODIUM: 142 mmol/L (ref 135–145)

## 2016-09-15 MED ORDER — RIVAROXABAN (XARELTO) VTE STARTER PACK (15 & 20 MG): ORAL_TABLET | ORAL | 0 refills | Status: DC

## 2016-09-15 NOTE — Discharge Instructions (Signed)
Start taking your furosemide (Lasix) once a day. Try to stay on a low-salt diet.  Stop taking meloxicam while you are taking the blood thinner (rivaroxaban, Xarelto). If you take the 2 medications together, it dramatically increases your risk of bleeding from your stomach.  You may resume your physical therapy for your knee replacement.

## 2016-09-15 NOTE — ED Notes (Signed)
Pt stable, ambulatory, states understanding of discharge instructions 

## 2016-09-15 NOTE — ED Triage Notes (Signed)
Pt. Stated, I had a knee replacement in October and I've had pain and swelling since the surgery.  Had a ultrasound and I have a clot.

## 2016-09-15 NOTE — ED Provider Notes (Signed)
[facility] DEPT Provider Note   CSN: [id number] Arrival date & time: 09/15/16  1656     History   Chief Complaint Chief Complaint  Patient presents with  . DVT  . Leg Pain  . Leg Swelling    HPI Jeff Rivera is a 75 y.o. male.  He is approximately 2 months status post right total knee arthroplasty. He has had swelling in his leg her since surgery. He was sent for venous Doppler to look for DVT, and it was positive. He was sent to the ED to start appropriate treatment. He has had pain in his legs since the surgery but this has not changed recently. He has also noted that there is some swelling in his other leg as well. He denies chest pain or dyspnea.   The history is provided by the patient.  Leg Pain      Past Medical History:  Diagnosis Date  . Aortic stenosis    mild   . Arthritis    OA  . CAD (coronary artery disease)   . Chronic kidney disease    creatine 1.5 was 1.5 09/2015  . Complication of anesthesia    patient was confused for 6-8 days.  doesnt remember anything from time he registered several days after he was admitted to [facility]  . Dyslipidemia   . ED (erectile dysfunction)   . GERD (gastroesophageal reflux disease)   . Heart murmur   . History of kidney stones   . Hypertension     Patient Active Problem List   Diagnosis Date Noted  . Acute encephalopathy 07/25/2016  . Hyponatremia 07/25/2016  . AKI (acute kidney injury) ([place]) 07/24/2016  . Anemia 07/24/2016  . 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 CAD (coronary artery disease) 09/12/2013  . Aortic stenosis 09/12/2013  . Dyslipidemia 09/12/2013  . HTN (hypertension) 09/12/2013    Past Surgical History:  Procedure Laterality Date  . CARDIAC CATHETERIZATION  01/2010   40% LAD lesion (Dr. Loni Muse. [facility])   . COLONOSCOPY    . COLONOSCOPY    . CYSTOSCOPY    . EYE SURGERY Bilateral 2013   LENS REPLACMENT FOR CATRACTS  . KNEE ARTHROPLASTY Right 07/14/2016   Procedure: COMPUTER  NAVIGATION ASSISTED TOTAL KNEE ARTHROPLASTY RIGHT;  Surgeon: Rod Can, MD;  Location: [facility];  Service: Orthopedics;  Laterality: Right;  . TIE OFF FOR LOW SPERM COUNT  50 YRS AGO  . TOTAL KNEE ARTHROPLASTY Left 10/27/2014   Procedure: LEFT TOTAL KNEE ARTHROPLASTY;  Surgeon: Mcarthur Rossetti, MD;  Location: [facility];  Service: Orthopedics;  Laterality: Left;  . TOTAL KNEE ARTHROPLASTY Right 07/15/2016  . TRANSTHORACIC ECHOCARDIOGRAM  09/2011   EF=>55%, mild conc LVH; normal RV systolic function; LA mildly dilated, IVC borderline dilated with collapse >50%; mild mitral annular calcif & mild MR; trace TR, elevated RVSP; AV mildly sclerotic, mild calcif of AV leaflets, mild valvular AS, mild regurg; trace pulm valve regurg       Home Medications    Prior to Admission medications   Medication Sig Start Date End Date Taking? Authorizing Provider  acetaminophen (TYLENOL) 500 MG tablet Take 500 mg by mouth every 6 (six) hours as needed for mild pain.   Yes Historical Provider, MD  alfuzosin (UROXATRAL) 10 MG 24 hr tablet Take 1 tablet by mouth daily. 07/16/15  Yes Historical Provider, MD  aspirin 81 MG chewable tablet Chew 1 tablet (81 mg total) by mouth 2 (two) times daily with a meal. 07/15/16  Yes Rod Can, MD  Cholecalciferol (VITAMIN D3) 2000 units capsule Take 2,000 Units by mouth daily.   Yes Historical Provider, MD  Coenzyme Q10 (CO Q 10) 100 MG CAPS Take 1 capsule by mouth 2 (two) times daily.   Yes Historical Provider, MD  lisinopril (PRINIVIL) 10 MG tablet Take 1 tablet (10 mg total) by mouth daily. 07/27/16  Yes Jennifer Chahn-Yang Choi, DO  MAGNESIUM PO Take 400 mg by mouth daily.    Yes Historical Provider, MD  Multiple Vitamin (MULTIVITAMIN) tablet Take 1 tablet by mouth daily.   Yes Historical Provider, MD  niacin 500 MG tablet Take 500 mg by mouth 2 (two) times daily with a meal.   Yes Historical Provider, MD  Omega-3 Fatty Acids (FISH OIL) 1200 MG CAPS Take 1 capsule  by mouth daily.   Yes Historical Provider, MD  omeprazole (PRILOSEC) 20 MG capsule Take 20 mg by mouth daily.   Yes Historical Provider, MD  simvastatin (ZOCOR) 20 MG tablet Take 20 mg by mouth every evening.    Yes Historical Provider, MD  traMADol (ULTRAM) 50 MG tablet Take 1-2 tablets (50-100 mg total) by mouth every 6 (six) hours as needed for moderate pain. 07/16/16  Yes Rod Can, MD  Rivaroxaban 15 & 20 MG TBPK Take as directed on package: Start with one 15mg  tablet by mouth twice a day with food. On Day 22, switch to one 20mg  tablet once a day with food. [id number]   Delora Fuel, MD    Family History Family History  Problem Relation Age of Onset  . Heart attack Father   . Coronary artery disease Father     Social History Social History  Substance Use Topics  . Smoking status: Former Smoker    Years: 4.00    Types: Cigarettes    Quit date: 09/05/1961  . Smokeless tobacco: Never Used  . Alcohol use 2.4 oz/week    1 Standard drinks or equivalent, 3 Glasses of wine per week     Comment: 3-4 GLASSES WINE PER WEEK     Allergies   No known allergies   Review of Systems Review of Systems  All other systems reviewed and are negative.    Physical Exam Updated Vital Signs BP 158/93 (BP Location: Right Arm)   Pulse 88   Temp 98.3 F (36.8 C) (Oral)   Resp 17   SpO2 97%   Physical Exam  Nursing note and vitals reviewed.  74 year old male, resting comfortably and in no acute distress. Vital signs are Significant for hypertension. Oxygen saturation is 97%, which is normal. Head is normocephalic and atraumatic. PERRLA, EOMI. Oropharynx is clear. Neck is nontender and supple without adenopathy or JVD. Back is nontender and there is no CVA tenderness. Lungs are clear without rales, wheezes, or rhonchi. Chest is nontender. Heart has regular rate and rhythm with [id number] systolic ejection murmur best heard at the cardiac apex. Abdomen is soft, flat, nontender without masses  or hepatosplenomegaly and peristalsis is normoactive. Extremities: There is 2+ pretibial edema on the left leg, and 3+ pretibial edema on the right leg. Right calf circumference is approximately 2-3 cm greater than left calf circumference. Right 5 care circumference is approximately 3 cm greater than left thigh circumference. There is no tenderness over the femoral vessels. There is no tenderness in the calf and no cord is palpable. There is no erythema or warmth. Homans sign is negative. Skin is warm and dry without rash. Neurologic: Mental status  is normal, cranial nerves are intact, there are no motor or sensory deficits.   ED Treatments / Results  Labs (all labs ordered are listed, but only abnormal results are displayed) Labs Reviewed  BASIC METABOLIC PANEL - Abnormal; Notable for the following:       Result Value   Glucose, Bld 111 (*)    Creatinine, Ser 1.46 (*)    GFR calc non Af Amer 45 (*)    GFR calc Af Amer 52 (*)    All other components within normal limits  CBC - Abnormal; Notable for the following:    RBC 3.40 (*)    Hemoglobin 10.7 (*)    HCT 32.7 (*)    All other components within normal limits    Procedures Procedures (including critical care time)  Medications Ordered in ED Medications - No data to display   Initial Impression / Assessment and Plan / ED Course  I have reviewed the triage vital signs and the nursing notes.  Pertinent labs & imaging results that were available during my care of the patient were reviewed by me and considered in my medical decision making (see chart for details).  Clinical Course    DVT in the right lower extremity with initiating factor being right total knee arthroplasty. Old records are reviewed confirming right knee arthroplasty on October 2. Preliminary report of vascular ultrasound is positive for DVT from the proximal femoral vein through the calf. No clinical signs or symptoms to suggest pulmonary embolism. He is started  on treatment with rivaroxaban. After workup shows stable renal insufficiency. He has mild to moderate anemia which is asked she improved over last value from when he was in the hospital. He is referred back to his PCP and cardiologist for reevaluation in one week. Also, he has significant lower extremity edema. He states that his PCP had given him a prescription for furosemide to take as needed. He is advised to start taking it once a day. He believes that it is a 20 mg tablet. His PCP and/or cardiologist will need to monitor his response to therapy and adjust diuretic dose accordingly.  Final Clinical Impressions(s) / ED Diagnoses   Final diagnoses:  Acute thromboembolism of deep veins of right lower extremity (HCC)  Peripheral edema    New Prescriptions New Prescriptions   RIVAROXABAN 15 & 20 MG TBPK    Take as directed on package: Start with one 15mg  tablet by mouth twice a day with food. On Day 22, switch to one 20mg  tablet once a day with food.     Delora Fuel, MD [id number] [phone]

## 2016-09-15 NOTE — Progress Notes (Addendum)
*  PRELIMINARY RESULTS* Vascular Ultrasound Right lower extremity venous duplex has been completed.  Preliminary findings: Right lower extremity is positive for acute deep vein thrombosis from the proximal femoral vein through the calf.  Left lower extremity negative for deep vein thrombosis. Negative for baker's cysts bilaterally. Preliminary results called to Maudie Mercury at Dr. Algernon Huxley office. Patient advised to go to emergency department for treatment.  Everrett Coombe 09/15/2016, 4:21 PM

## 2016-09-19 DIAGNOSIS — K219 Gastro-esophageal reflux disease without esophagitis: Secondary | ICD-10-CM | POA: Diagnosis not present

## 2016-09-19 DIAGNOSIS — I82409 Acute embolism and thrombosis of unspecified deep veins of unspecified lower extremity: Secondary | ICD-10-CM | POA: Diagnosis not present

## 2016-09-19 DIAGNOSIS — Z09 Encounter for follow-up examination after completed treatment for conditions other than malignant neoplasm: Secondary | ICD-10-CM | POA: Diagnosis not present

## 2016-09-19 DIAGNOSIS — M170 Bilateral primary osteoarthritis of knee: Secondary | ICD-10-CM | POA: Diagnosis not present

## 2016-09-24 DIAGNOSIS — N3941 Urge incontinence: Secondary | ICD-10-CM | POA: Diagnosis not present

## 2016-09-24 DIAGNOSIS — R351 Nocturia: Secondary | ICD-10-CM | POA: Diagnosis not present

## 2016-09-24 DIAGNOSIS — N401 Enlarged prostate with lower urinary tract symptoms: Secondary | ICD-10-CM | POA: Diagnosis not present

## 2016-09-25 DIAGNOSIS — M1711 Unilateral primary osteoarthritis, right knee: Secondary | ICD-10-CM | POA: Diagnosis not present

## 2016-09-30 DIAGNOSIS — M1711 Unilateral primary osteoarthritis, right knee: Secondary | ICD-10-CM | POA: Diagnosis not present

## 2016-10-01 DIAGNOSIS — I10 Essential (primary) hypertension: Secondary | ICD-10-CM | POA: Diagnosis not present

## 2016-10-01 DIAGNOSIS — E7800 Pure hypercholesterolemia, unspecified: Secondary | ICD-10-CM | POA: Diagnosis not present

## 2016-10-01 DIAGNOSIS — R0609 Other forms of dyspnea: Secondary | ICD-10-CM | POA: Diagnosis not present

## 2016-10-01 DIAGNOSIS — I352 Nonrheumatic aortic (valve) stenosis with insufficiency: Secondary | ICD-10-CM | POA: Diagnosis not present

## 2016-10-07 DIAGNOSIS — M1711 Unilateral primary osteoarthritis, right knee: Secondary | ICD-10-CM | POA: Diagnosis not present

## 2016-10-15 DIAGNOSIS — M1711 Unilateral primary osteoarthritis, right knee: Secondary | ICD-10-CM | POA: Diagnosis not present

## 2016-10-17 DIAGNOSIS — M1711 Unilateral primary osteoarthritis, right knee: Secondary | ICD-10-CM | POA: Diagnosis not present

## 2016-10-21 DIAGNOSIS — Z471 Aftercare following joint replacement surgery: Secondary | ICD-10-CM | POA: Diagnosis not present

## 2016-10-21 DIAGNOSIS — Z96651 Presence of right artificial knee joint: Secondary | ICD-10-CM | POA: Diagnosis not present

## 2016-10-22 IMAGING — CR DG CHEST 2V
2 series · 2 of 2 positions shown · non-contrast
Comparison: 06/17/2013

CLINICAL DATA: Preop, left total knee arthroplasty

EXAM:
CHEST  2 VIEW

[w chest pa]
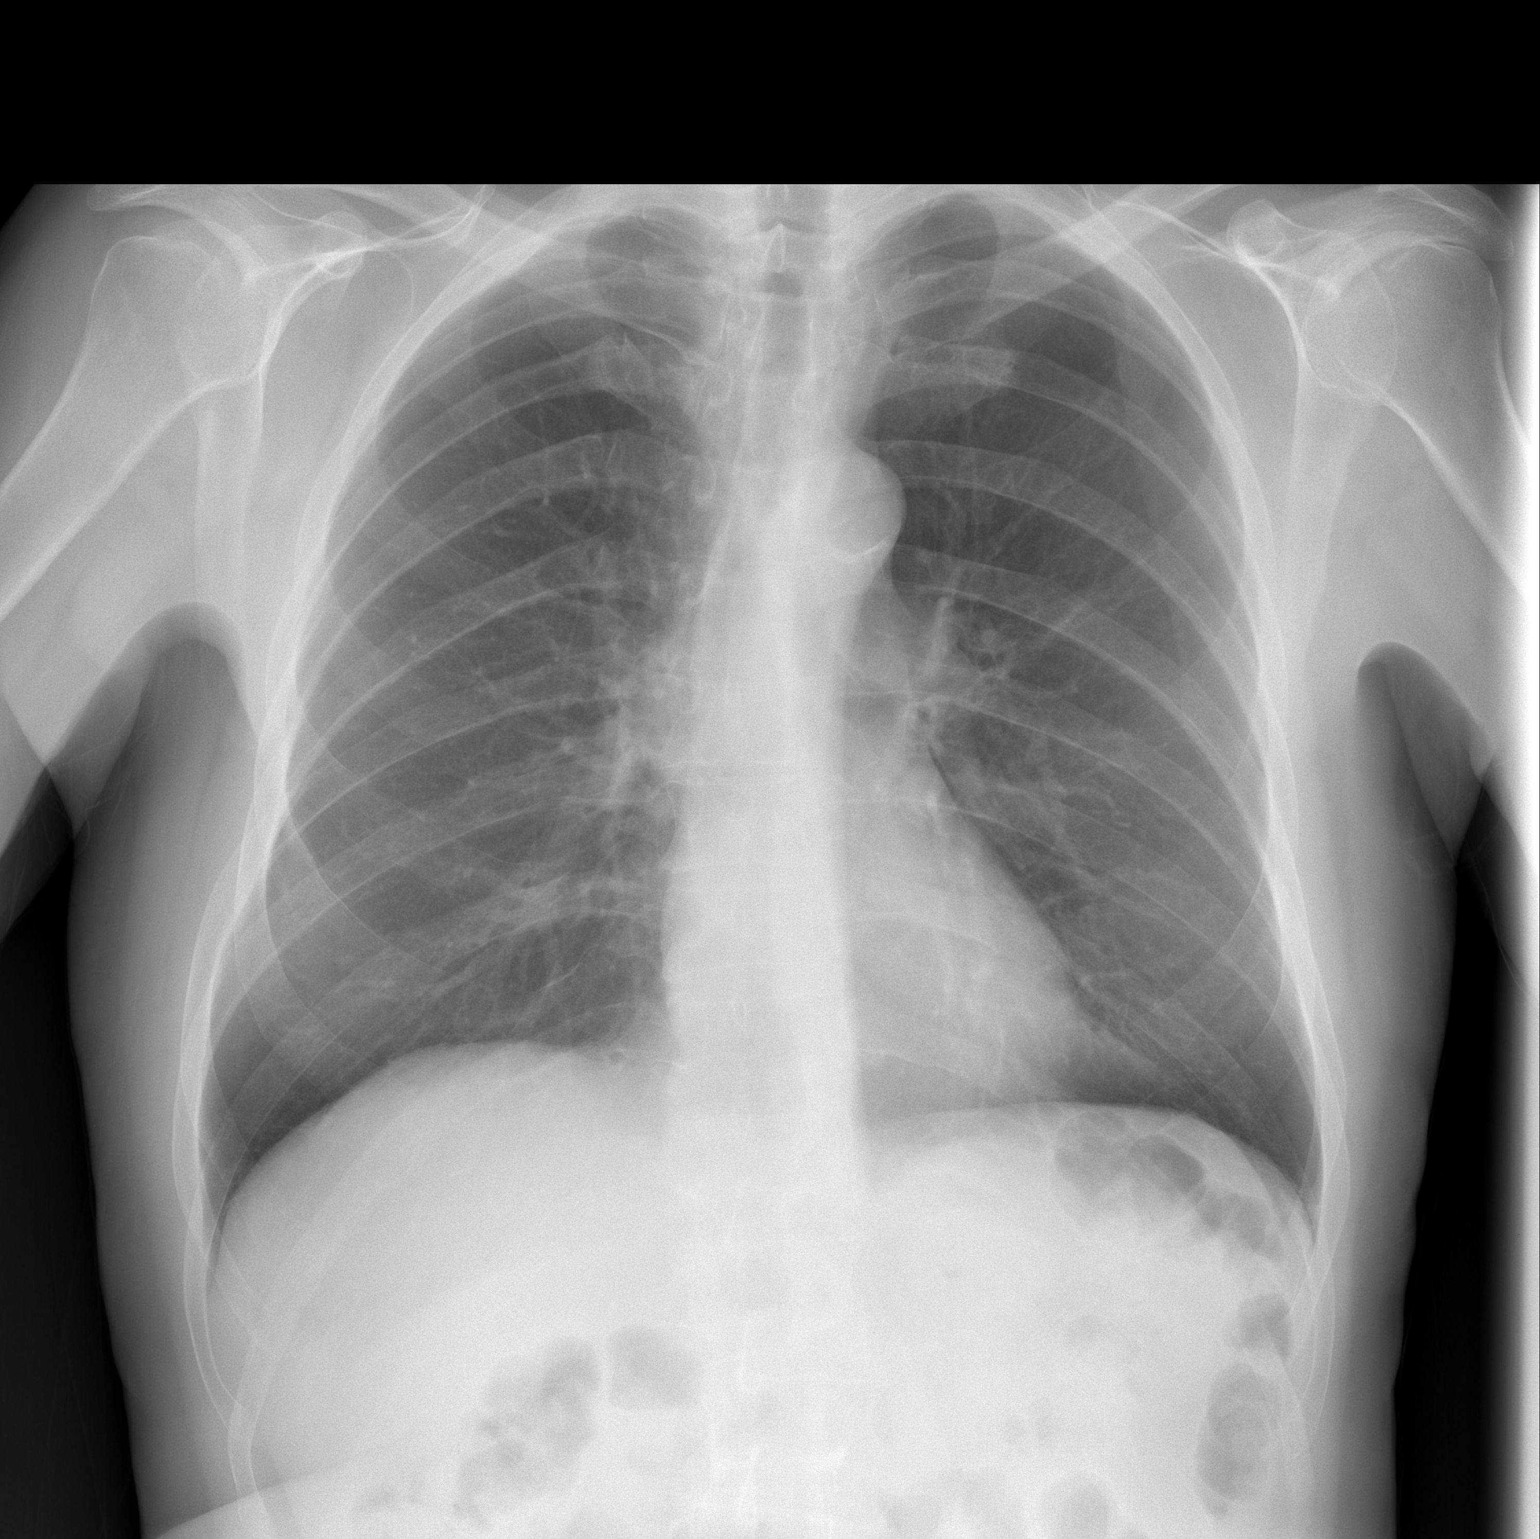

[w chest lat]
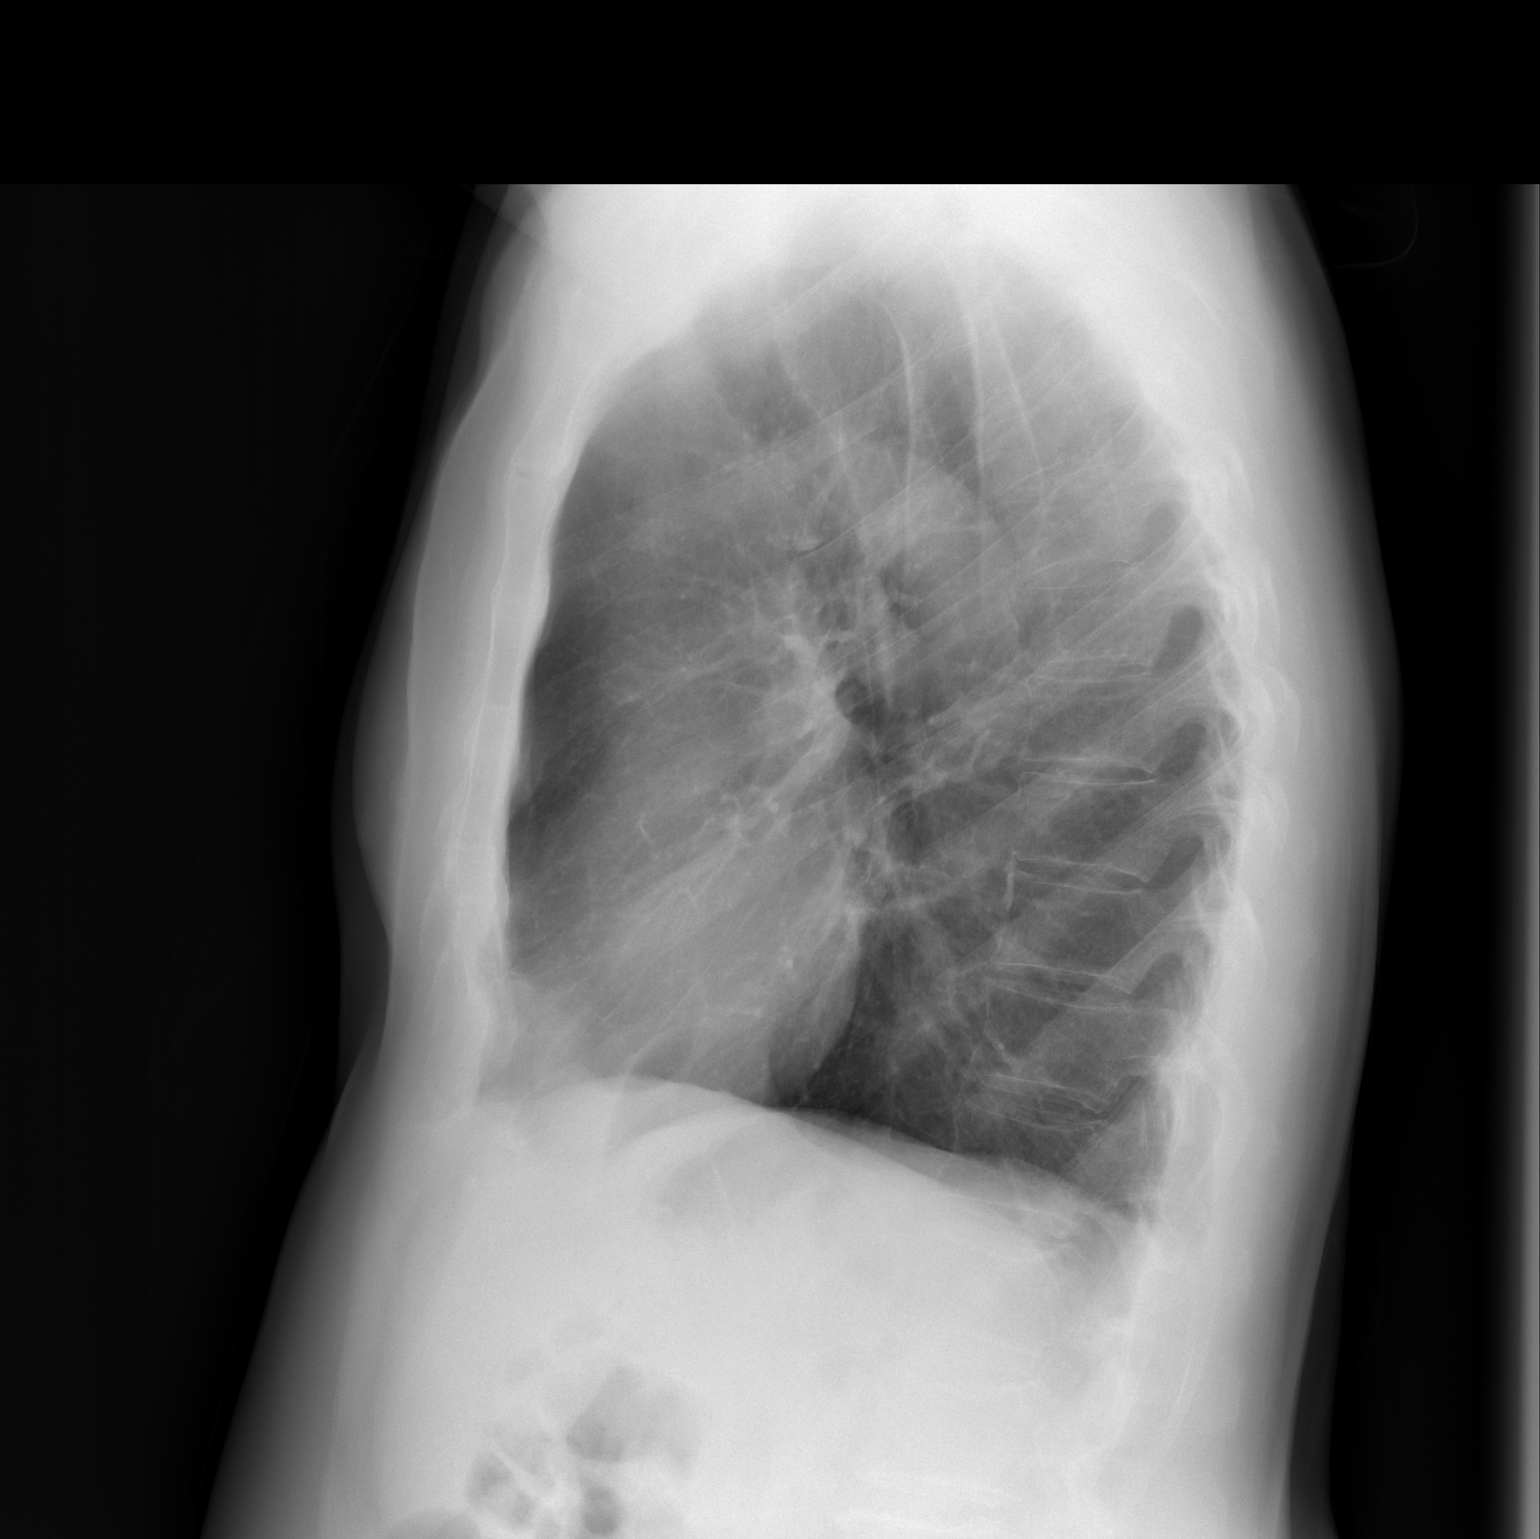

[2 of 2 positions shown; findings below may reference images not displayed]

FINDINGS: Cardiomediastinal silhouette is stable. Mild thoracic
dextroscoliosis. No acute infiltrate or pleural effusion. No
pulmonary edema.
IMPRESSION: No active cardiopulmonary disease.

## 2016-10-23 DIAGNOSIS — M1711 Unilateral primary osteoarthritis, right knee: Secondary | ICD-10-CM | POA: Diagnosis not present

## 2016-10-31 DIAGNOSIS — M1711 Unilateral primary osteoarthritis, right knee: Secondary | ICD-10-CM | POA: Diagnosis not present

## 2016-11-06 DIAGNOSIS — M1711 Unilateral primary osteoarthritis, right knee: Secondary | ICD-10-CM | POA: Diagnosis not present

## 2016-11-11 DIAGNOSIS — R195 Other fecal abnormalities: Secondary | ICD-10-CM | POA: Diagnosis not present

## 2016-11-11 DIAGNOSIS — K922 Gastrointestinal hemorrhage, unspecified: Secondary | ICD-10-CM | POA: Diagnosis not present

## 2016-11-11 DIAGNOSIS — Z1212 Encounter for screening for malignant neoplasm of rectum: Secondary | ICD-10-CM | POA: Diagnosis not present

## 2016-11-11 DIAGNOSIS — R5383 Other fatigue: Secondary | ICD-10-CM | POA: Diagnosis not present

## 2016-11-11 DIAGNOSIS — R2681 Unsteadiness on feet: Secondary | ICD-10-CM | POA: Diagnosis not present

## 2016-11-11 DIAGNOSIS — Z23 Encounter for immunization: Secondary | ICD-10-CM | POA: Diagnosis not present

## 2016-11-12 DIAGNOSIS — D500 Iron deficiency anemia secondary to blood loss (chronic): Secondary | ICD-10-CM | POA: Diagnosis not present

## 2016-11-12 DIAGNOSIS — K449 Diaphragmatic hernia without obstruction or gangrene: Secondary | ICD-10-CM | POA: Diagnosis not present

## 2016-11-12 DIAGNOSIS — D62 Acute posthemorrhagic anemia: Secondary | ICD-10-CM | POA: Diagnosis not present

## 2016-11-12 DIAGNOSIS — K921 Melena: Secondary | ICD-10-CM | POA: Diagnosis not present

## 2016-11-13 DIAGNOSIS — Z86718 Personal history of other venous thrombosis and embolism: Secondary | ICD-10-CM | POA: Diagnosis not present

## 2016-11-13 DIAGNOSIS — I82409 Acute embolism and thrombosis of unspecified deep veins of unspecified lower extremity: Secondary | ICD-10-CM | POA: Diagnosis present

## 2016-11-13 DIAGNOSIS — I10 Essential (primary) hypertension: Secondary | ICD-10-CM | POA: Diagnosis not present

## 2016-11-13 DIAGNOSIS — Z8719 Personal history of other diseases of the digestive system: Secondary | ICD-10-CM | POA: Diagnosis not present

## 2016-11-13 NOTE — H&P (Signed)
OFFICE VISIT NOTES COPIED TO EPIC FOR DOCUMENTATION  . History of Present Illness Laverda Page MD; 11/13/2016 5:59 PM) Patient words: ACUTE visit for blood clots and medications; last office visit 10/01/2016.  The patient is a 76 year old male who presents for a follow-up for Follow-up for DVT.  Additional reasons for visit:  Follow-up for Aortic stenosis is described as the following: Patient is here for an acute visit regarding his Xarelto and recent black stools and significant anemia. Patient had right knee arthroplasty 2 months ago, he presented to the emergency room on 09/15/2016 with worsening right leg edema than the left, painful swelling. Was diagnosed with DVT and started on Xarelto and discharged home.  On Monday saw PCP due to black stools and was noted to have Hg of 8.5. He was advised to stop taking Xarelto. He went to GI yesterday and EGD was done which was normal and Hg had came up to 9.4. Supposed to go to GI doctor next Monday for repeat blood work. He recommended starting Xarelto tomorrow and if symptoms came back would do colonoscopy. He is falling frequently due to pain in leg.  His right leg is still extensively swollen. Denies chest pain, SOB, dyspnea at this time. He does have some chronic dyspne on exertion.  He has a history of hypertension, hyperlipidemia, and mild CAD with a 40% LAD lesion by coronary angiogram in 2011. He also has a history of moderate aortic stenosis with moderate aortic insufficiency.   Problem List/Past Medical Franky Macho Reader; 11/13/2016 2:21 PM) Aortic valve stenosis with insufficiency, unspecified etiology (I35.2)  Echocardiogram 02/20/2016: 1. Left ventricle cavity is normal in size. Mild concentric hypertrophy of the left ventricle. Normal global wall motion. Normal diastolic filling pattern. Calculated EF 64%. 2. Left atrial cavity is mildly dilated at 4.2 cm. 3. Mild calcification of the aortic valve annulus. Mild aortic valve  leaflet calcification. Mildly restricted aortic valve leaflets. Mild aortic valve stenosis. Aortic valve peak pressure gradient of 29 and mean gradient of 18 mmHg, calculated aortic valve area 1.27 cm. Mild to moderate aortic regurgitation. 4. Mild calcification of the mitral valve annulus. Mild mitral valve leaflet calcification. Mild mitral regurgitation. 5. Mild tricuspid regurgitation. Mild pulmonary hypertension. Pulmonary artery systolic pressure is estimated at 35 mm Hg. 6. IVC is dilated with blunted respiratory response. This suggests elevated right heart pressure. Labwork  Labs 11/12/2016: RBC 2.95, Hgb 9.4, Hct 28.2, MCV 95.6, no other indicis. Labs12/01/2016: Serum glucose 111 mg, creatinine 1.46, BUN 16, eGFR 45, hemoglobin 10.7/hematocrit 32.7 normal indicis. 07/20/2015: Creatinine 1.38, potassium 4.5 11/04/2014: HB 8.7/HCT 26.1 with normocytic indices, BUN 30, creatinine 1.5, potassium 5.6 Benign hypertension (I10)  Dyspnea on exertion (R06.09)  Abnormal stress ECG with treadmill (R94.39)  Lexiscan myoview stress test 02/11/2016: 1. The resting electrocardiogram demonstrated normal sinus rhythm, normal resting conduction, no resting arrhythmias and normal rest repolarization. Stress EKG is non-diagnostic for ischemia as it a pharmacologic stress using Lexiscan. Stress symptoms included dizziness. 2. Myocardial perfusion imaging is normal. Overall left ventricular systolic function was normal without regional wall motion abnormalities. The left ventricular ejection fraction was 78%. Hypercholesteremia (E78.00)  Bilateral carotid bruits (R09.89)  Carotid artery duplex 02/20/2016: Right carotid bifurcation mild heterogeneous plaque. Left common carotid soft plaque. Left carotid bifurcation mild heterogeneous plaque. Left ICA heteregenous plaque. no stenosis. Antegrade right vertebral artery flow. Antegrade left vertebral artery flow. Cramp of both lower extremities (R25.2)  ABI 02/20/2016:  This exam reveals normal perfusion of the right  lower extremity with ABI 1.0 and mildly decreased perfusion of the left lower extremity with ABI 0.94 noted at the post tibial artery level. Benign non-nodular prostatic hyperplasia with lower urinary tract symptoms (N40.1)  Arthritis (M19.90)  History of deep venous thrombosis (DVT) of distal vein of right lower extremity (Z86.718) 09/15/2016 Lower extremity venous duplex 09/15/2016: acute deep vein thrombosis involving the right femoral vein, right popliteal vein, right posterial tibial vein, and right peroneal vein. - No evidence of deep vein thrombosis involving the visualized veins of the left lower extremity. This followed Right knee arthroplasty 2 months prior.  Allergies (Charavina Reader; 12/08/16 2:21 PM) No Known Allergies 02/07/2016  Family History Franky Macho Reader; 12-08-16 2:21 PM) Mother  Deceased. at age 89 from issues falling and breaking hip Father  Deceased. at age 51 from MI Sister 1  older, age 48 quad bypass  Social History ([place] Reader; December 08, 2016 2:21 PM) Current tobacco use  Former smoker. quit at age 76 Alcohol Use  Moderate alcohol use. small glass of wine each night Marital status  Married. Living Situation  Lives with spouse. Number of Children  1.  Past Surgical History Franky Macho Reader; 12-08-16 2:21 PM) Total Knee Replacement - Left 10/27/2014  Medication History (Charavina Reader; 12-08-16 2:43 PM) Lisinopril (10MG Tablet, 1 Oral daily, Taken starting 03/07/2016) Active. Furosemide (40MG Tablet, 1 (one) Tablet Oral every morning, Taken starting 10/01/2016) Active. Omeprazole (20MG Capsule DR, 1 Oral daily) Active. Magnesium Oxide (400MG Capsule, 1 Oral daily) Active. CoQ10 (100MG Capsule, 1 Oral daily) Active. Mens Multivitamin Plus (1 Oral daily) Active. Myrbetriq (25MG Tablet ER 24HR, 1 Oral as needed) Active. TraMADol HCl (50MG Tablet, 1 Oral as needed) Active. Simvastatin  (20MG Tablet, 1 Oral daily) Active. Xarelto Starter Pack (15 & 20MG Tab Ther Pack, 1 Oral two times daily) Discontinued: Temporarily held. Xarelto (20MG Tablet, 1 (one) Tablet Oral every evening after dinner, Taken starting 10/01/2016) Discontinued: Temporarily held. (Due to a colonoscopy for black and tarry stools) Fish Oil (1000MG Capsule DR, 1 Oral daily) Discontinued: Patient Choice. Aspirin (81MG Tablet, 1 Oral daily) Discontinued: Patient Choice. D3 Dots (2000UNIT Tablet Disint, 1 Oral daily) Discontinued: Patient Choice. Medications Reconciled (verbally with pt; list present, but not completely accurate.)  Diagnostic Studies History (Charavina Reader; 2016-12-08 2:32 PM) Ablation  Coronary Angiogram 2011 40% LAD lesion Colonoscopy 2012 Normal. Treadmill stress test 06/29/2013 Reduced exercise tolerance, otherwise normal. Echocardiogram 02/02/2015 LVEF 25-05%, grade 1 diastolic dysfunction. Tricuspid aortic valve. Severely thickened aortic valve leaflets, severely calcified leaflets, mild to moderate stenosis, moderate regurgitation. Peak gradient 33 mmHg, V-max 1.28 cm. Aortic root normal in size. Nuclear stress test 02/11/2016 1. The resting electrocardiogram demonstrated normal sinus rhythm, normal resting conduction, no resting arrhythmias and normal rest repolarization. Stress EKG is non-diagnostic for ischemia as it a pharmacologic stress using Lexiscan. Stress symptoms included dizziness. 2. Myocardial perfusion imaging is normal. Overall left ventricular systolic function was normal without regional wall motion abnormalities. The left ventricular ejection fraction was 78%. Carotid Doppler 02/20/2016 Right carotid bifurcation mild heterogeneous plaque. Left common carotid soft plaque. Left carotid bifurcation mild heterogeneous plaque. Left ICA heteregenous plaque. no stenosis. Antegrade right vertebral artery flow. Antegrade left vertebral artery flow. Endoscopy  11/12/2016    Review of Systems Laverda Page MD; 08-Dec-2016 5:50 PM) General Not Present- Anorexia, Fatigue and Fever. Respiratory Present- Difficulty Breathing on Exertion (chronic and has been stabble until GI bleed and feels tired). Not Present- Cough and Wakes up from Sleep Wheezing or Short of  Breath. Cardiovascular Present- Night Cramps. Not Present- Chest Pain, Claudications and Palpitations. Gastrointestinal Present- Black, Tarry Stool. Not Present- Change in Bowel Habits and Nausea. Neurological Not Present- Focal Neurological Symptoms and Syncope. Endocrine Not Present- Cold Intolerance, Excessive Sweating, Heat Intolerance and Thyroid Problems. Hematology Not Present- Anemia, Easy Bruising, Petechiae and Prolonged Bleeding.  Vitals Franky Macho Reader; 11/13/2016 2:45 PM) 11/13/2016 2:30 PM Weight: 175.5 lb Height: 68in Body Surface Area: 1.93 m Body Mass Index: 26.68 kg/m  Pulse: 77 (Regular)  P.OX: 98% (Room air) BP: 110/60 (Sitting, Left Arm, Standard)  Physical Exam Sue Lush; 11/13/2016 3:36 PM) General Mental Status-Alert. General Appearance-Cooperative, Appears stated age, Not in acute distress. Build & Nutrition-Well nourished and Moderately built.  Head and Neck Thyroid Gland Characteristics - no palpable nodules, no palpable enlargement.  Chest and Lung Exam Chest and lung exam reveals -normal excursion with symmetric chest walls and on auscultation, normal breath sounds, no adventitious sounds.  Cardiovascular Inspection Jugular vein - Right - No Distention. Auscultation Heart Sounds - S1 WNL, S2 WNL and No gallop present. Murmurs & Other Heart Sounds: Murmur: Murmur 2 - Location - Aortic Area. Timing - Diastolic. Grade - I/VI. Location - Aortic Area. Timing - Early systolic. Grade - III/VI. Radiation - Carotids.  Abdomen Palpation/Percussion Normal exam - Non Tender and No hepatosplenomegaly. Auscultation Normal exam - Bowel  sounds normal.  Peripheral Vascular Lower Extremity Inspection - Bilateral - Loss of hair. Palpation - Edema - Right - 3+ Pitting edema(right leg extensive edema). Femoral pulse - Bilateral - Normal. Popliteal pulse - Bilateral - Feeble. Dorsalis pedis pulse - Bilateral - Absent. Posterior tibial pulse - Bilateral - Normal. Carotid arteries - Bilateral-Soft Bruit. Abdomen-No prominent abdominal aortic pulsation, No epigastric bruit.  Neurologic Neurologic evaluation reveals -alert and oriented x 3 with no impairment of recent or remote memory. Motor-Grossly intact without any focal deficits.  Musculoskeletal Global Assessment Left Lower Extremity - normal range of motion without pain. Right Lower Extremity - normal range of motion without pain.    Assessment & Plan Laverda Page MD; 11/13/2016 5:58 PM) Benign hypertension (I10) Impression: EKG 10/01/2016: Normal sinus rhythm at rate of 86 bpm, normal axis. No evidence of ischemia, normal EKG. No significant change from EKG 02/07/2016 Current Plans METABOLIC PANEL, BASIC (32951) Future Plans 05/20/4165: METABOLIC PANEL, BASIC (06301) - one time 12/18/2016: CBC & PLATELETS (AUTO) (60109) - one time   Labwork Story: Labs 11/13/2016: Glucose 114, BUN 32, creatinine 1.64, EGFR 40, sodium 133, potassium 4.9, CMP otherwise normal.  Labs 11/12/2016: RBC 2.95, Hgb 9.4, Hct 28.2, MCV 95.6, no other indicis.  Labs12/01/2016: Serum glucose 111 mg, creatinine 1.46, BUN 16, eGFR 45, hemoglobin 10.7/hematocrit 32.7 normal indicis.  07/20/2015: Creatinine 1.38, potassium 4.5  11/04/2014: HB 8.7/HCT 26.1 with normocytic indices, BUN 30, creatinine 1.5, potassium 5.6 H/O: GI bleed (Z87.19) Story: Upper GI-Dr Clarene Essex 11/12/2016: NOrmal EGD. Restart Anticoagulation. Consider filter History of deep venous thrombosis (DVT) of distal vein of right lower extremity (N23.557) Story: Lower extremity venous duplex 09/15/2016: acute deep vein  thrombosis involving the right femoral vein, right popliteal vein, right posterial tibial vein, and right peroneal vein. - No evidence of deep vein thrombosis involving the visualized veins of the left lower extremity. This followed Right knee arthroplasty 2 months prior. Current Plans Started TraMADol HCl 50MG, 1 Tablet three times daily, as needed, #60, 11/13/2016, No Refill.  Plan:   Mechanism of underlying disease process and action of medications discussed with the patient.  I discussed primary/secondary prevention and also dietary counseling was done. Patient is here for an acute visit due to black tarry stools while on Xarelto for DVT. He saw his PCP on Monday who advised him to discontinue the Xarelto due to black tarry stools and Hg of 8.5. He saw GI this week for an EGD which was normal and Hg had come up to 9.4. On further investigation, patient stated he has been using Ibuprofen a couple times a week due to pain in right leg from knee replacement and can't get his Tramadol refilled. I have given him a prescription for Tramadol and advised him not to take any ibuprofen or aspirin. I'm very concerned about extensive nature of the DVT, however not much option for lysis due to active GI bleed. Significant change in his physical exam since last time I saw him when he mows only very mild.  Physical exam suggests extensive DVT in right leg, and I am very concerned about possibility of pulmonary embolus. I will place an IVC filter tomorrow morning first thing. His sister at the bedside, I have discussed with him regarding the risks and benefits including risk of perforation, embolization, bleeding and IVC syndrome and they're willing to proceed. I have ordered pre-procedure labwork. Advised him to restart Xarelto this afternoon and will monitor CBC. If he continues to have black stools and low Hg, I will consider trying coumadin. I suspect NSAIDs contributed to his bleeding risk.  Advised patient to  rest and elevate legs for a few days until this filter is set. His aortic valve disease clinically appears to be stable and his dyspnea is also stable. Blood pressure is well controlled.  CC: Thressa Sheller, MD    Signed by Laverda Page, MD (11/13/2016 5:59 PM)

## 2016-11-14 ENCOUNTER — Encounter (HOSPITAL_COMMUNITY): Payer: Self-pay

## 2016-11-14 ENCOUNTER — Inpatient Hospital Stay (HOSPITAL_COMMUNITY)
Admission: RE | Admit: 2016-11-14 | Discharge: 2016-11-15 | DRG: 253 | Disposition: A | Discharge status: Home / Self Care | Payer: Medicare Other | Source: Ambulatory Visit | Attending: Family Medicine | Admitting: Family Medicine

## 2016-11-14 ENCOUNTER — Encounter (HOSPITAL_COMMUNITY)
Admission: RE | Disposition: A | Discharge status: Home / Self Care | Payer: Self-pay | Source: Ambulatory Visit | Attending: Family Medicine

## 2016-11-14 DIAGNOSIS — I82441 Acute embolism and thrombosis of right tibial vein: Secondary | ICD-10-CM | POA: Diagnosis present

## 2016-11-14 DIAGNOSIS — I272 Pulmonary hypertension, unspecified: Secondary | ICD-10-CM | POA: Diagnosis present

## 2016-11-14 DIAGNOSIS — D62 Acute posthemorrhagic anemia: Secondary | ICD-10-CM | POA: Diagnosis not present

## 2016-11-14 DIAGNOSIS — I10 Essential (primary) hypertension: Secondary | ICD-10-CM | POA: Diagnosis present

## 2016-11-14 DIAGNOSIS — I82409 Acute embolism and thrombosis of unspecified deep veins of unspecified lower extremity: Secondary | ICD-10-CM | POA: Diagnosis present

## 2016-11-14 DIAGNOSIS — Z96651 Presence of right artificial knee joint: Secondary | ICD-10-CM | POA: Diagnosis present

## 2016-11-14 DIAGNOSIS — K219 Gastro-esophageal reflux disease without esophagitis: Secondary | ICD-10-CM | POA: Diagnosis present

## 2016-11-14 DIAGNOSIS — E785 Hyperlipidemia, unspecified: Secondary | ICD-10-CM | POA: Diagnosis present

## 2016-11-14 DIAGNOSIS — Z87891 Personal history of nicotine dependence: Secondary | ICD-10-CM | POA: Diagnosis not present

## 2016-11-14 DIAGNOSIS — I82411 Acute embolism and thrombosis of right femoral vein: Secondary | ICD-10-CM | POA: Diagnosis not present

## 2016-11-14 DIAGNOSIS — N189 Chronic kidney disease, unspecified: Secondary | ICD-10-CM | POA: Diagnosis present

## 2016-11-14 DIAGNOSIS — M1990 Unspecified osteoarthritis, unspecified site: Secondary | ICD-10-CM | POA: Diagnosis present

## 2016-11-14 DIAGNOSIS — I82431 Acute embolism and thrombosis of right popliteal vein: Secondary | ICD-10-CM | POA: Diagnosis present

## 2016-11-14 DIAGNOSIS — I2510 Atherosclerotic heart disease of native coronary artery without angina pectoris: Secondary | ICD-10-CM | POA: Diagnosis present

## 2016-11-14 DIAGNOSIS — N3281 Overactive bladder: Secondary | ICD-10-CM | POA: Diagnosis present

## 2016-11-14 DIAGNOSIS — I083 Combined rheumatic disorders of mitral, aortic and tricuspid valves: Secondary | ICD-10-CM | POA: Diagnosis present

## 2016-11-14 DIAGNOSIS — I129 Hypertensive chronic kidney disease with stage 1 through stage 4 chronic kidney disease, or unspecified chronic kidney disease: Secondary | ICD-10-CM | POA: Diagnosis present

## 2016-11-14 DIAGNOSIS — E7800 Pure hypercholesterolemia, unspecified: Secondary | ICD-10-CM | POA: Diagnosis present

## 2016-11-14 DIAGNOSIS — K921 Melena: Secondary | ICD-10-CM | POA: Diagnosis present

## 2016-11-14 HISTORY — PX: PERIPHERAL VASCULAR CATHETERIZATION: SHX172C

## 2016-11-14 LAB — CBC
HCT: 24 % — ABNORMAL LOW (ref 39.0–52.0)
HEMATOCRIT: 22.8 % — AB (ref 39.0–52.0)
Hemoglobin: 7.8 g/dL — ABNORMAL LOW (ref 13.0–17.0)
Hemoglobin: 7.3 g/dL — ABNORMAL LOW (ref 13.0–17.0)
MCH: 31 pg (ref 26.0–34.0)
MCH: 30.5 pg (ref 26.0–34.0)
MCHC: 32.5 g/dL (ref 30.0–36.0)
MCHC: 32 g/dL (ref 30.0–36.0)
MCV: 95.2 fL (ref 78.0–100.0)
MCV: 95.4 fL (ref 78.0–100.0)
PLATELETS: 268 10*3/uL (ref 150–400)
Platelets: 248 10*3/uL (ref 150–400)
RBC: 2.52 MIL/uL — AB (ref 4.22–5.81)
RBC: 2.39 MIL/uL — ABNORMAL LOW (ref 4.22–5.81)
RDW: 15.2 % (ref 11.5–15.5)
RDW: 15.2 % (ref 11.5–15.5)
WBC: 6.9 10*3/uL (ref 4.0–10.5)
WBC: 6.3 10*3/uL (ref 4.0–10.5)

## 2016-11-14 LAB — HEMOGLOBIN AND HEMATOCRIT, BLOOD
HCT: 25.1 % — ABNORMAL LOW (ref 39.0–52.0)
Hemoglobin: 8.3 g/dL — ABNORMAL LOW (ref 13.0–17.0)

## 2016-11-14 LAB — CREATININE, SERUM
CREATININE: 1.72 mg/dL — AB (ref 0.61–1.24)
GFR calc Af Amer: 42 mL/min — ABNORMAL LOW (ref >60)
GFR calc non Af Amer: 37 mL/min — ABNORMAL LOW (ref >60)

## 2016-11-14 LAB — PREPARE RBC (CROSSMATCH)

## 2016-11-14 SURGERY — IVC FILTER INSERTION
Anesthesia: LOCAL

## 2016-11-14 MED ORDER — SODIUM CHLORIDE 0.9% FLUSH: 3.0000 mL | Freq: Two times a day (BID) | INTRAVENOUS | Status: DC

## 2016-11-14 MED ORDER — OXYCODONE-ACETAMINOPHEN 5-325 MG PO TABS
ORAL_TABLET | ORAL | Status: AC
  Filled 2016-11-14: qty 1

## 2016-11-14 MED ORDER — FENTANYL CITRATE (PF) 100 MCG/2ML IJ SOLN: 50.0000 ug | INTRAMUSCULAR | Status: DC | PRN

## 2016-11-14 MED ORDER — SODIUM CHLORIDE 0.9 % IV SOLN
80.0000 mg | Freq: Once | INTRAVENOUS | Status: AC
  Administered 2016-11-14: 80 mg via INTRAVENOUS
  Filled 2016-11-14: qty 80

## 2016-11-14 MED ORDER — SODIUM CHLORIDE 0.9 % IV SOLN: Freq: Once | INTRAVENOUS | Status: DC

## 2016-11-14 MED ORDER — ACETAMINOPHEN 325 MG PO TABS: 650.0000 mg | ORAL_TABLET | Freq: Four times a day (QID) | ORAL | Status: DC | PRN

## 2016-11-14 MED ORDER — PANTOPRAZOLE SODIUM 40 MG IV SOLR: 40.0000 mg | Freq: Two times a day (BID) | INTRAVENOUS | Status: DC

## 2016-11-14 MED ORDER — ACETAMINOPHEN 650 MG RE SUPP: 650.0000 mg | Freq: Four times a day (QID) | RECTAL | Status: DC | PRN

## 2016-11-14 MED ORDER — OXYCODONE-ACETAMINOPHEN 5-325 MG PO TABS
1.0000 | ORAL_TABLET | ORAL | Status: DC | PRN
  Administered 2016-11-14: 2 via ORAL

## 2016-11-14 MED ORDER — SODIUM CHLORIDE 0.9 % IV SOLN
INTRAVENOUS | Status: DC
  Administered 2016-11-14 – 2016-11-15 (×4): via INTRAVENOUS

## 2016-11-14 MED ORDER — FENTANYL CITRATE (PF) 100 MCG/2ML IJ SOLN
INTRAMUSCULAR | Status: AC
  Filled 2016-11-14: qty 2

## 2016-11-14 MED ORDER — SODIUM CHLORIDE 0.9% FLUSH: 3.0000 mL | INTRAVENOUS | Status: DC | PRN

## 2016-11-14 MED ORDER — SODIUM CHLORIDE 0.9 % IV SOLN
8.0000 mg/h | INTRAVENOUS | Status: DC
  Administered 2016-11-14 – 2016-11-15 (×3): 8 mg/h via INTRAVENOUS
  Filled 2016-11-14 (×6): qty 80

## 2016-11-14 MED ORDER — IODIXANOL 320 MG/ML IV SOLN
INTRAVENOUS | Status: DC | PRN
  Administered 2016-11-14: 30 mL via INTRAVENOUS

## 2016-11-14 MED ORDER — ONDANSETRON HCL 4 MG/2ML IJ SOLN: 4.0000 mg | Freq: Four times a day (QID) | INTRAMUSCULAR | Status: DC | PRN

## 2016-11-14 MED ORDER — POLYSACCHARIDE IRON COMPLEX 150 MG PO CAPS
150.0000 mg | ORAL_CAPSULE | Freq: Two times a day (BID) | ORAL | Status: DC
  Administered 2016-11-14 – 2016-11-15 (×2): 150 mg via ORAL
  Filled 2016-11-14 (×2): qty 1

## 2016-11-14 MED ORDER — HEPARIN (PORCINE) IN NACL 2-0.9 UNIT/ML-% IJ SOLN
INTRAMUSCULAR | Status: AC
  Filled 2016-11-14: qty 500

## 2016-11-14 MED ORDER — HYDRALAZINE HCL 20 MG/ML IJ SOLN: 10.0000 mg | Freq: Three times a day (TID) | INTRAMUSCULAR | Status: DC | PRN

## 2016-11-14 MED ORDER — ACETAMINOPHEN 500 MG PO TABS: 500.0000 mg | ORAL_TABLET | Freq: Four times a day (QID) | ORAL | Status: DC | PRN

## 2016-11-14 MED ORDER — SIMVASTATIN 20 MG PO TABS
20.0000 mg | ORAL_TABLET | Freq: Every evening | ORAL | Status: DC
  Administered 2016-11-14: 20 mg via ORAL
  Filled 2016-11-14: qty 1

## 2016-11-14 MED ORDER — SODIUM CHLORIDE 0.9% FLUSH
3.0000 mL | INTRAVENOUS | Status: DC | PRN
Start: 2016-11-14 — End: 2016-11-14

## 2016-11-14 MED ORDER — SODIUM CHLORIDE 0.9 % IV BOLUS (SEPSIS)
500.0000 mL | Freq: Once | INTRAVENOUS | Status: AC
  Administered 2016-11-14: 500 mL via INTRAVENOUS

## 2016-11-14 MED ORDER — MIDAZOLAM HCL 2 MG/2ML IJ SOLN
INTRAMUSCULAR | Status: AC
  Filled 2016-11-14: qty 2

## 2016-11-14 MED ORDER — SODIUM CHLORIDE 0.9% FLUSH
3.0000 mL | Freq: Two times a day (BID) | INTRAVENOUS | Status: DC
  Administered 2016-11-14 – 2016-11-15 (×3): 3 mL via INTRAVENOUS

## 2016-11-14 MED ORDER — FENTANYL CITRATE (PF) 100 MCG/2ML IJ SOLN
50.0000 ug | INTRAMUSCULAR | Status: DC | PRN
  Administered 2016-11-14: 50 ug via INTRAVENOUS

## 2016-11-14 MED ORDER — OXYCODONE-ACETAMINOPHEN 5-325 MG PO TABS
ORAL_TABLET | ORAL | Status: AC
  Filled 2016-11-14: qty 2

## 2016-11-14 MED ORDER — LIDOCAINE HCL (PF) 1 % IJ SOLN
INTRAMUSCULAR | Status: DC | PRN
  Administered 2016-11-14: 20 mL via SUBCUTANEOUS

## 2016-11-14 MED ORDER — SODIUM CHLORIDE 0.9 % IV SOLN: INTRAVENOUS | Status: DC

## 2016-11-14 MED ORDER — FUROSEMIDE 40 MG PO TABS: 40.0000 mg | ORAL_TABLET | Freq: Every day | ORAL | Status: DC | PRN

## 2016-11-14 MED ORDER — MIRABEGRON ER 50 MG PO TB24
50.0000 mg | ORAL_TABLET | Freq: Every day | ORAL | Status: DC
  Administered 2016-11-14 – 2016-11-15 (×2): 50 mg via ORAL
  Filled 2016-11-14 (×3): qty 1

## 2016-11-14 MED ORDER — SODIUM CHLORIDE 0.9 % IV SOLN: 250.0000 mL | INTRAVENOUS | Status: DC | PRN

## 2016-11-14 MED ORDER — HEPARIN (PORCINE) IN NACL 2-0.9 UNIT/ML-% IJ SOLN
INTRAMUSCULAR | Status: DC | PRN
  Administered 2016-11-14: 500 mL

## 2016-11-14 MED ORDER — MIDAZOLAM HCL 2 MG/2ML IJ SOLN
INTRAMUSCULAR | Status: DC | PRN
  Administered 2016-11-14: 2 mg via INTRAVENOUS

## 2016-11-14 MED ORDER — TRAMADOL HCL 50 MG PO TABS
50.0000 mg | ORAL_TABLET | Freq: Four times a day (QID) | ORAL | Status: DC | PRN
  Administered 2016-11-14: 100 mg via ORAL
  Administered 2016-11-14: 50 mg via ORAL
  Administered 2016-11-15: 100 mg via ORAL
  Filled 2016-11-14 (×3): qty 2

## 2016-11-14 MED ORDER — ONDANSETRON HCL 4 MG PO TABS: 4.0000 mg | ORAL_TABLET | Freq: Four times a day (QID) | ORAL | Status: DC | PRN

## 2016-11-14 MED ORDER — FENTANYL CITRATE (PF) 100 MCG/2ML IJ SOLN
INTRAMUSCULAR | Status: DC | PRN
  Administered 2016-11-14 (×4): 25 ug via INTRAVENOUS

## 2016-11-14 MED ORDER — LIDOCAINE HCL (PF) 1 % IJ SOLN
INTRAMUSCULAR | Status: AC
  Filled 2016-11-14: qty 30

## 2016-11-14 MED ORDER — CO Q 10 100 MG PO CAPS: 1.0000 | ORAL_CAPSULE | Freq: Every day | ORAL | Status: DC

## 2016-11-14 MED ORDER — ADULT MULTIVITAMIN W/MINERALS CH
1.0000 | ORAL_TABLET | Freq: Every day | ORAL | Status: DC
  Administered 2016-11-14 – 2016-11-15 (×2): 1 via ORAL
  Filled 2016-11-14 (×2): qty 1

## 2016-11-14 SURGICAL SUPPLY — 11 items
BAG SNAP BAND KOVER 36X36 (MISCELLANEOUS) ×2 IMPLANT
COVER DOME SNAP 22 D (MISCELLANEOUS) ×1 IMPLANT
COVER PRB 48X5XTLSCP FOLD TPE (BAG) IMPLANT
COVER PROBE 5X48 (BAG) ×2
FILTER VC CELECT-JUGULAR (Miscellaneous) ×1 IMPLANT
KIT PV (KITS) ×2 IMPLANT
SYRINGE MEDRAD AVANTA MACH 7 (SYRINGE) ×1 IMPLANT
TRAY PV CATH (CUSTOM PROCEDURE TRAY) ×2 IMPLANT
TUBING HIGH PRESSURE 120CM (CONNECTOR) ×4 IMPLANT
WIRE HITORQ VERSACORE ST 145CM (WIRE) ×1 IMPLANT
WIRE MINI STICK MAX (SHEATH) ×1 IMPLANT

## 2016-11-14 NOTE — Interval H&P Note (Signed)
History and Physical Interval Note:  11/14/2016 7:49 AM  Jeff Rivera  has presented today for surgery, with the diagnosis of right femoral dvt  The various methods of treatment have been discussed with the patient and family. After consideration of risks, benefits and other options for treatment, the patient has consented to  Procedure(s): IVC Filter Insertion (N/A) as a surgical intervention .  The patient's history has been reviewed, patient examined, no change in status, stable for surgery.  I have reviewed the patient's chart and labs.  Questions were answered to the patient's satisfaction.     Adrian Prows

## 2016-11-14 NOTE — H&P (Signed)
[facility] History and Physical  Jeff Rivera [id number] DOB: 09-May-1941 DOA: 11/14/2016  Referring physician: Dr. Einar Rivera PCP: Jeff Sheller, MD   Chief Complaint: "I keep pooping black stuff."  HPI: Jeff Rivera is a 76 y.o. male  with past medical history significant for chronic kidney disease, kidney stones, high blood pressure, chronic upper GI bleed and DVT presents to the hospital as a direct admission at the request of Dr. Einar Rivera. Late last year the patient had right knee surgery. He then in early December presented with right leg edema and painful swelling was diagnosed with a venous thromboembolism and was started on Xarelto discharged home. Patient was noted to have black stools and hemoglobin was low so Xarelto stopped. By report patient had EGD in the last week and was normal. Patient was follow-up with GI doctor on Monday for repeat blood work. Patient came to the hospital for an IVC filter placement. There were no complications reported during the procedure. Dr. Einar Rivera wants patient observed overnight. Note patient has history of frequent falls from leg pain.   Review of Systems:  As per HPI otherwise 10 point review of systems negative.    Past Medical History:  Diagnosis Date  . Aortic stenosis    mild   . Arthritis    OA  . CAD (coronary artery disease)   . Chronic kidney disease    creatine 1.5 was 1.5 09/2015  . Complication of anesthesia    patient was confused for 6-8 days.  doesnt remember anything from time he registered several days after he was admitted to [facility]  . Dyslipidemia   . ED (erectile dysfunction)   . GERD (gastroesophageal reflux disease)   . Heart murmur   . History of kidney stones   . Hypertension    Past Surgical History:  Procedure Laterality Date  . CARDIAC CATHETERIZATION  01/2010   40% LAD lesion (Dr. Loni Muse. [place])   . COLONOSCOPY    . COLONOSCOPY    . CYSTOSCOPY    . EYE SURGERY Bilateral 2013   LENS REPLACMENT FOR CATRACTS   . KNEE ARTHROPLASTY Right 07/14/2016   Procedure: COMPUTER NAVIGATION ASSISTED TOTAL KNEE ARTHROPLASTY RIGHT;  Surgeon: Rod Can, MD;  Location: [facility];  Service: Orthopedics;  Laterality: Right;  . TIE OFF FOR LOW SPERM COUNT  50 YRS AGO  . TOTAL KNEE ARTHROPLASTY Left 10/27/2014   Procedure: LEFT TOTAL KNEE ARTHROPLASTY;  Surgeon: Mcarthur Rossetti, MD;  Location: [facility];  Service: Orthopedics;  Laterality: Left;  . TOTAL KNEE ARTHROPLASTY Right 07/15/2016  . TRANSTHORACIC ECHOCARDIOGRAM  09/2011   EF=>55%, mild conc LVH; normal RV systolic function; LA mildly dilated, IVC borderline dilated with collapse >50%; mild mitral annular calcif & mild MR; trace TR, elevated RVSP; AV mildly sclerotic, mild calcif of AV leaflets, mild valvular AS, mild regurg; trace pulm valve regurg   Social History:  reports that he quit smoking about 55 years ago. His smoking use included Cigarettes. He quit after 4.00 years of use. He has never used smokeless tobacco. He reports that he drinks about 2.4 oz of alcohol per week . He reports that he does not use drugs.  No Known Allergies  Family History  Problem Relation Age of Onset  . Heart attack Father   . Coronary artery disease Father      Prior to Admission medications   Medication Sig Start Date End Date Taking? Authorizing Provider  acetaminophen (TYLENOL) 500 MG tablet Take 500 mg by  mouth every 6 (six) hours as needed for mild pain.   Yes Historical Provider, MD  Coenzyme Q10 (CO Q 10) 100 MG CAPS Take 1 capsule by mouth daily.    Yes Historical Provider, MD  furosemide (LASIX) 40 MG tablet Take 40 mg by mouth daily as needed for fluid. 10/01/16  Yes Historical Provider, MD  lisinopril (PRINIVIL) 10 MG tablet Take 1 tablet (10 mg total) by mouth daily. 07/27/16  Yes Jennifer Chahn-Yang Choi, DO  mirabegron ER (MYRBETRIQ) 50 MG TB24 tablet Take 50 mg by mouth daily.   Yes Historical Provider, MD  Multiple Vitamin (MULTIVITAMIN) tablet Take 1  tablet by mouth daily.   Yes Historical Provider, MD  omeprazole (PRILOSEC) 20 MG capsule Take 40 mg by mouth daily.    Yes Historical Provider, MD  simvastatin (ZOCOR) 20 MG tablet Take 20 mg by mouth every evening.    Yes Historical Provider, MD  traMADol (ULTRAM) 50 MG tablet Take 1-2 tablets (50-100 mg total) by mouth every 6 (six) hours as needed for moderate pain. Patient taking differently: Take 50 mg by mouth every 6 (six) hours as needed for moderate pain.  07/16/16  Yes Rod Can, MD  XARELTO 20 MG TABS tablet Take 20 mg by mouth every evening. 10/01/16  Yes Historical Provider, MD  aspirin 81 MG chewable tablet Chew 1 tablet (81 mg total) by mouth 2 (two) times daily with a meal. Patient not taking: Reported on 11/13/2016 07/15/16   Rod Can, MD  Rivaroxaban 15 & 20 MG TBPK Take as directed on package: Start with one 15mg  tablet by mouth twice a day with food. On Day 22, switch to one 20mg  tablet once a day with food. Patient not taking: Reported on 11/13/2016 0000000   Delora Fuel, MD   Physical Exam: Vitals:   11/14/16 0945 11/14/16 1000 11/14/16 1015 11/14/16 1030  BP: (!) 144/64 (!) 131/99 (!) 113/59 (!) 108/47  Pulse: 100 (!) 105 89 82  Resp: 13 20 11 15   Temp:      TempSrc:      SpO2: 99% 98% 98% 99%  Weight:      Height:        Wt Readings from Last 3 Encounters:  11/14/16 79.4 kg (175 lb)  07/26/16 87.6 kg (193 lb 1.6 oz)  07/14/16 79.5 kg (175 lb 5 oz)    General:  Appears calm and comfortable, A&Ox3 Eyes:  PERRL, EOMI, normal lids, iris ENT:  grossly normal hearing, lips & tongue Neck:  no LAD, masses or thyromegaly Cardiovascular:  RRR, no m/r/g. Non pitting LE edema bil.  Respiratory:  CTA bilaterally, no w/r/r. Normal respiratory effort. Abdomen:  soft, ntnd Skin:  no rash or induration seen on limited exam Musculoskeletal:  grossly normal tone BUE/BLE Psychiatric:  grossly normal mood and affect, speech fluent and appropriate Neurologic:  CN 2-12  grossly intact, moves all extremities in coordinated fashion.          Labs on Admission:  Basic Metabolic Panel: No results for input(s): NA, K, CL, CO2, GLUCOSE, BUN, CREATININE, CALCIUM, MG, PHOS in the last 168 hours. Liver Function Tests: No results for input(s): AST, ALT, ALKPHOS, BILITOT, PROT, ALBUMIN in the last 168 hours. No results for input(s): LIPASE, AMYLASE in the last 168 hours. No results for input(s): AMMONIA in the last 168 hours. CBC:  Recent Labs Lab 11/14/16 0651  WBC 6.9  HGB 7.8*  HCT 24.0*  MCV 95.2  PLT 268  Cardiac Enzymes: No results for input(s): CKTOTAL, CKMB, CKMBINDEX, TROPONINI in the last 168 hours.  BNP (last 3 results) No results for input(s): BNP in the last 8760 hours.  ProBNP (last 3 results) No results for input(s): PROBNP in the last 8760 hours.   Creatinine clearance cannot be calculated (Patient's most recent lab result is older than the maximum 21 days allowed.)  CBG: No results for input(s): GLUCAP in the last 168 hours.  Radiological Exams on Admission: No results found.  EKG: pending  Assessment/Plan Principal Problem:   Acute blood loss anemia Active Problems:   CAD (coronary artery disease)   Dyslipidemia   HTN (hypertension)   DVT (deep venous thrombosis) (HCC)   Acute blood loss anemia Patient has a history of chronic upper GI bleed per history given by him and his son Patient is followed by Dr. Watt Climes with [facility] physicians gastroenterology Serial hemoglobin On-call physician gastrenterology's has been notified and Dr. Amedeo Plenty states he appreciates notice of pt being in hospital but given recent EGd for issue likely NTD at this time  IV PPI Patient deferred rectal exam stating he just had one  Falls 2/2 Leg pain PT/OT eval after off of bedrest  DVT Not on anticoag due to GI bleed, Xarelto stopped POD #0 for IVC filter placement by Dr. Einar Rivera  Hypertension When necessary hydralazine 10 mg IV as needed  for severe blood pressure  Hyperlipidemia Continue statin  GERD Cont PPI  CAD Asa 81mg  held  Overactive bladder Mirabegron continued  Hyperlipidemia Continue statin   Code Status: Full DVT Prophylaxis: Heparin-->SCDs Family Communication: son at bedside Disposition Plan: Pending Improvement  Status: obs, med-surg  Elwin Mocha, MD Family Medicine [facility] [email or web address] Password [email or web address]

## 2016-11-14 NOTE — Progress Notes (Signed)
Blood work drawn. Protonix IV started. 2nd IV started for possible blood transfusion. Patient in a lot of pain rt knee and rt ankle. Dr. Einar Gip notified and orders received.

## 2016-11-15 LAB — CBC
HCT: 26 % — ABNORMAL LOW (ref 39.0–52.0)
Hemoglobin: 8.6 g/dL — ABNORMAL LOW (ref 13.0–17.0)
MCH: 31 pg (ref 26.0–34.0)
MCHC: 33.1 g/dL (ref 30.0–36.0)
MCV: 93.9 fL (ref 78.0–100.0)
PLATELETS: 219 10*3/uL (ref 150–400)
RBC: 2.77 MIL/uL — AB (ref 4.22–5.81)
RDW: 16.8 % — AB (ref 11.5–15.5)
WBC: 7.1 10*3/uL (ref 4.0–10.5)

## 2016-11-15 LAB — BASIC METABOLIC PANEL
Anion gap: 3 — ABNORMAL LOW (ref 5–15)
BUN: 19 mg/dL (ref 6–20)
CALCIUM: 8.6 mg/dL — AB (ref 8.9–10.3)
CHLORIDE: 108 mmol/L (ref 101–111)
CO2: 26 mmol/L (ref 22–32)
CREATININE: 1.53 mg/dL — AB (ref 0.61–1.24)
GFR calc Af Amer: 49 mL/min — ABNORMAL LOW (ref >60)
GFR calc non Af Amer: 42 mL/min — ABNORMAL LOW (ref >60)
GLUCOSE: 97 mg/dL (ref 65–99)
Potassium: 4.4 mmol/L (ref 3.5–5.1)
Sodium: 137 mmol/L (ref 135–145)

## 2016-11-15 LAB — TYPE AND SCREEN
BLOOD PRODUCT EXPIRATION DATE: 201802242359
Blood Product Expiration Date: 201802242359
ISSUE DATE / TIME: 201802021637
ISSUE DATE / TIME: 201802021829
UNIT TYPE AND RH: 5100
UNIT TYPE AND RH: 5100

## 2016-11-15 MED ORDER — FUROSEMIDE 40 MG PO TABS: 40.0000 mg | ORAL_TABLET | Freq: Every day | ORAL | Status: AC | PRN

## 2016-11-15 NOTE — Discharge Summary (Signed)
Physician Discharge Summary  Jeff Rivera [id number] DOB: 1941-03-30 DOA: 11/14/2016  PCP: Thressa Sheller, MD  Admit date: 11/14/2016 Discharge date: 11/15/2016  Time spent: > 35 minutes  Recommendations for Outpatient Follow-up:  1. Due to low normal blood pressures will hold ACE I on discharge 2. Please reassess on Monday blood levels 3. Patient reports pain being controlled on tramadol and son reports prescription ready at pharmacy such we'll not discharge on any extra pain medicines.   Discharge Diagnoses:  Principal Problem:   Acute blood loss anemia Active Problems:   CAD (coronary artery disease)   Dyslipidemia   HTN (hypertension)   DVT (deep venous thrombosis) (HCC)   Discharge Condition: stable  Diet recommendation: heart healthy  Filed Weights   11/14/16 0548 11/15/16 0722  Weight: 79.4 kg (175 lb) 82.9 kg (182 lb 12.8 oz)    History of present illness:  76 y.o. male  with past medical history significant for chronic kidney disease, kidney stones, high blood pressure, chronic upper GI bleed and DVT presents to the hospital as a direct admission at the request of Dr. Einar Gip. Patient was hospitalized after IVC filter placement  Hospital Course:  Blood loss anemia - Hemoglobin highest on last 3 checks. - Stable no reports of bleeding. We'll hold Xarelto on defer to physician on Monday whether or not to continue. Discussed with Dr. Einar Gip as discussed with patient and son who verbalized agreement and understanding.  Right lower extremity discomfort/DVT -Pain well controlled on tramadol per my discussion with patient. Son reports the patient has prescription which she has not picked up the pharmacy yet  Hypertension - Patient has elevated serum creatinine which is decreased from the prior check. We'll hold ACE inhibitor on discharge given low normal blood pressures  Procedures:  IVC filter placement  Consultations:  None  Discharge Exam: Vitals:   11/15/16  0547 11/15/16 0552  BP: (!) 84/38 (!) 103/49  Pulse: 82 75  Resp: 16   Temp: 98.6 F (37 C)     General: Pt in nad, alert and awake Cardiovascular: rrr, no rubs Respiratory: no increased wob, no wheezes  Discharge Instructions   Discharge Instructions    Call MD for:  redness, tenderness, or signs of infection (pain, swelling, redness, odor or green/yellow discharge around incision site)    Complete by:  As directed    Call MD for:  severe uncontrolled pain    Complete by:  As directed    Call MD for:  temperature >100.4    Complete by:  As directed    Diet - low sodium heart healthy    Complete by:  As directed    Discharge instructions    Complete by:  As directed    Please have patient follow up with Dr. Einar Gip on Monday. At that point they will decide whether or not to continue your xarelto   Increase activity slowly    Complete by:  As directed      Current Discharge Medication List    CONTINUE these medications which have CHANGED   Details  furosemide (LASIX) 40 MG tablet Take 1 tablet (40 mg total) by mouth daily as needed for fluid. Qty: 30 tablet      CONTINUE these medications which have NOT CHANGED   Details  acetaminophen (TYLENOL) 500 MG tablet Take 500 mg by mouth every 6 (six) hours as needed for mild pain.    Coenzyme Q10 (CO Q 10) 100 MG CAPS Take 1 capsule by  mouth daily.     mirabegron ER (MYRBETRIQ) 50 MG TB24 tablet Take 50 mg by mouth daily.    Multiple Vitamin (MULTIVITAMIN) tablet Take 1 tablet by mouth daily.    omeprazole (PRILOSEC) 20 MG capsule Take 40 mg by mouth daily.     simvastatin (ZOCOR) 20 MG tablet Take 20 mg by mouth every evening.     traMADol (ULTRAM) 50 MG tablet Take 1-2 tablets (50-100 mg total) by mouth every 6 (six) hours as needed for moderate pain. Qty: 60 tablet, Refills: 0      STOP taking these medications     lisinopril (PRINIVIL) 10 MG tablet      XARELTO 20 MG TABS tablet      aspirin 81 MG chewable  tablet      Rivaroxaban 15 & 20 MG TBPK        No Known Allergies    The results of significant diagnostics from this hospitalization (including imaging, microbiology, ancillary and laboratory) are listed below for reference.    Significant Diagnostic Studies: No results found.  Microbiology: No results found for this or any previous visit (from the past 240 hour(s)).   Labs: Basic Metabolic Panel:  Recent Labs Lab 11/14/16 1416 11/15/16 0826  NA  --  137  K  --  4.4  CL  --  108  CO2  --  26  GLUCOSE  --  97  BUN  --  19  CREATININE 1.72* 1.53*  CALCIUM  --  8.6*   Liver Function Tests: No results for input(s): AST, ALT, ALKPHOS, BILITOT, PROT, ALBUMIN in the last 168 hours. No results for input(s): LIPASE, AMYLASE in the last 168 hours. No results for input(s): AMMONIA in the last 168 hours. CBC:  Recent Labs Lab 11/14/16 0651 11/14/16 1416 11/14/16 2328 11/15/16 0826  WBC 6.9 6.3  --  7.1  HGB 7.8* 7.3* 8.3* 8.6*  HCT 24.0* 22.8* 25.1* 26.0*  MCV 95.2 95.4  --  93.9  PLT 268 248  --  219   Cardiac Enzymes: No results for input(s): CKTOTAL, CKMB, CKMBINDEX, TROPONINI in the last 168 hours. BNP: BNP (last 3 results) No results for input(s): BNP in the last 8760 hours.  ProBNP (last 3 results) No results for input(s): PROBNP in the last 8760 hours.  CBG: No results for input(s): GLUCAP in the last 168 hours.     Signed:  Velvet Bathe MD.  [facility] 11/15/2016, 2:01 PM

## 2016-11-15 NOTE — Progress Notes (Signed)
Jeff Rivera to be D/C'd Home per MD order.  Discussed with the patient and all questions fully answered.  VSS, Skin clean, dry and intact without evidence of skin break down, no evidence of skin tears noted. IV catheter discontinued intact. Site without signs and symptoms of complications. Dressing and pressure applied.  An After Visit Summary was printed and given to the patient. Patient received prescription.   D/c education completed with patient/family including follow up instructions, medication list, d/c activities limitations if indicated, with other d/c instructions as indicated by MD - patient able to verbalize understanding, all questions fully answered.   Patient instructed to return to ED, call 911, or call MD for any changes in condition.   Patient escorted via [place], and D/C home via private auto.  Dorris Carnes 11/15/2016 4:25 PM

## 2016-11-15 NOTE — Progress Notes (Signed)
Pt was pleasant this morning while getting report from the outgoing nurse, but his altitude changed at about 1130. His son came in and told the nurse he had called him about 10 times today. MD was notified and he called in to speak to son,  MD later put his discharge information in. Discharge information was given to daughter and nurse wheeled pt to the private vehicle, and was taken home by daughter.

## 2016-11-16 ENCOUNTER — Encounter (HOSPITAL_COMMUNITY): Payer: Self-pay | Admitting: Emergency Medicine

## 2016-11-16 ENCOUNTER — Emergency Department (HOSPITAL_COMMUNITY): Payer: Medicare Other

## 2016-11-16 ENCOUNTER — Inpatient Hospital Stay (HOSPITAL_COMMUNITY)
Admission: EM | Admit: 2016-11-16 | Discharge: 2016-11-25 | DRG: 871 | Disposition: A | Discharge status: Skilled Nursing Facility | Payer: Medicare Other | Attending: Internal Medicine | Admitting: Internal Medicine

## 2016-11-16 ENCOUNTER — Inpatient Hospital Stay (HOSPITAL_COMMUNITY): Payer: Medicare Other

## 2016-11-16 DIAGNOSIS — G912 (Idiopathic) normal pressure hydrocephalus: Secondary | ICD-10-CM | POA: Diagnosis present

## 2016-11-16 DIAGNOSIS — K922 Gastrointestinal hemorrhage, unspecified: Secondary | ICD-10-CM | POA: Diagnosis not present

## 2016-11-16 DIAGNOSIS — R7989 Other specified abnormal findings of blood chemistry: Secondary | ICD-10-CM

## 2016-11-16 DIAGNOSIS — R945 Abnormal results of liver function studies: Secondary | ICD-10-CM

## 2016-11-16 DIAGNOSIS — Z96651 Presence of right artificial knee joint: Secondary | ICD-10-CM | POA: Diagnosis not present

## 2016-11-16 DIAGNOSIS — M19011 Primary osteoarthritis, right shoulder: Secondary | ICD-10-CM | POA: Diagnosis not present

## 2016-11-16 DIAGNOSIS — J189 Pneumonia, unspecified organism: Secondary | ICD-10-CM | POA: Diagnosis present

## 2016-11-16 DIAGNOSIS — D500 Iron deficiency anemia secondary to blood loss (chronic): Secondary | ICD-10-CM

## 2016-11-16 DIAGNOSIS — R509 Fever, unspecified: Secondary | ICD-10-CM | POA: Diagnosis present

## 2016-11-16 DIAGNOSIS — M25511 Pain in right shoulder: Secondary | ICD-10-CM

## 2016-11-16 DIAGNOSIS — G9349 Other encephalopathy: Secondary | ICD-10-CM | POA: Diagnosis not present

## 2016-11-16 DIAGNOSIS — Z95828 Presence of other vascular implants and grafts: Secondary | ICD-10-CM

## 2016-11-16 DIAGNOSIS — Z8719 Personal history of other diseases of the digestive system: Secondary | ICD-10-CM | POA: Diagnosis not present

## 2016-11-16 DIAGNOSIS — R2689 Other abnormalities of gait and mobility: Secondary | ICD-10-CM | POA: Diagnosis not present

## 2016-11-16 DIAGNOSIS — I350 Nonrheumatic aortic (valve) stenosis: Secondary | ICD-10-CM | POA: Diagnosis not present

## 2016-11-16 DIAGNOSIS — Y95 Nosocomial condition: Secondary | ICD-10-CM | POA: Diagnosis present

## 2016-11-16 DIAGNOSIS — D649 Anemia, unspecified: Secondary | ICD-10-CM | POA: Diagnosis not present

## 2016-11-16 DIAGNOSIS — E785 Hyperlipidemia, unspecified: Secondary | ICD-10-CM | POA: Diagnosis present

## 2016-11-16 DIAGNOSIS — A419 Sepsis, unspecified organism: Principal | ICD-10-CM | POA: Diagnosis present

## 2016-11-16 DIAGNOSIS — R41841 Cognitive communication deficit: Secondary | ICD-10-CM | POA: Diagnosis not present

## 2016-11-16 DIAGNOSIS — I129 Hypertensive chronic kidney disease with stage 1 through stage 4 chronic kidney disease, or unspecified chronic kidney disease: Secondary | ICD-10-CM | POA: Diagnosis present

## 2016-11-16 DIAGNOSIS — D62 Acute posthemorrhagic anemia: Secondary | ICD-10-CM | POA: Diagnosis present

## 2016-11-16 DIAGNOSIS — J9811 Atelectasis: Secondary | ICD-10-CM | POA: Diagnosis not present

## 2016-11-16 DIAGNOSIS — I82411 Acute embolism and thrombosis of right femoral vein: Secondary | ICD-10-CM | POA: Diagnosis not present

## 2016-11-16 DIAGNOSIS — Z86718 Personal history of other venous thrombosis and embolism: Secondary | ICD-10-CM | POA: Diagnosis not present

## 2016-11-16 DIAGNOSIS — Z6825 Body mass index (BMI) 25.0-25.9, adult: Secondary | ICD-10-CM

## 2016-11-16 DIAGNOSIS — G9341 Metabolic encephalopathy: Secondary | ICD-10-CM | POA: Diagnosis not present

## 2016-11-16 DIAGNOSIS — Z96653 Presence of artificial knee joint, bilateral: Secondary | ICD-10-CM | POA: Diagnosis present

## 2016-11-16 DIAGNOSIS — N189 Chronic kidney disease, unspecified: Secondary | ICD-10-CM | POA: Diagnosis present

## 2016-11-16 DIAGNOSIS — R531 Weakness: Secondary | ICD-10-CM

## 2016-11-16 DIAGNOSIS — G9340 Encephalopathy, unspecified: Secondary | ICD-10-CM | POA: Diagnosis not present

## 2016-11-16 DIAGNOSIS — N183 Chronic kidney disease, stage 3 (moderate): Secondary | ICD-10-CM | POA: Diagnosis present

## 2016-11-16 DIAGNOSIS — I2510 Atherosclerotic heart disease of native coronary artery without angina pectoris: Secondary | ICD-10-CM | POA: Diagnosis present

## 2016-11-16 DIAGNOSIS — M47812 Spondylosis without myelopathy or radiculopathy, cervical region: Secondary | ICD-10-CM | POA: Diagnosis not present

## 2016-11-16 DIAGNOSIS — R627 Adult failure to thrive: Secondary | ICD-10-CM | POA: Diagnosis not present

## 2016-11-16 DIAGNOSIS — Z8249 Family history of ischemic heart disease and other diseases of the circulatory system: Secondary | ICD-10-CM

## 2016-11-16 DIAGNOSIS — I82401 Acute embolism and thrombosis of unspecified deep veins of right lower extremity: Secondary | ICD-10-CM | POA: Diagnosis not present

## 2016-11-16 DIAGNOSIS — M25551 Pain in right hip: Secondary | ICD-10-CM | POA: Diagnosis not present

## 2016-11-16 DIAGNOSIS — M542 Cervicalgia: Secondary | ICD-10-CM

## 2016-11-16 DIAGNOSIS — Z87891 Personal history of nicotine dependence: Secondary | ICD-10-CM

## 2016-11-16 DIAGNOSIS — K8020 Calculus of gallbladder without cholecystitis without obstruction: Secondary | ICD-10-CM | POA: Diagnosis not present

## 2016-11-16 DIAGNOSIS — M6281 Muscle weakness (generalized): Secondary | ICD-10-CM | POA: Diagnosis not present

## 2016-11-16 DIAGNOSIS — R4182 Altered mental status, unspecified: Secondary | ICD-10-CM | POA: Diagnosis not present

## 2016-11-16 DIAGNOSIS — K219 Gastro-esophageal reflux disease without esophagitis: Secondary | ICD-10-CM | POA: Diagnosis present

## 2016-11-16 DIAGNOSIS — M545 Low back pain: Secondary | ICD-10-CM | POA: Diagnosis not present

## 2016-11-16 DIAGNOSIS — R600 Localized edema: Secondary | ICD-10-CM | POA: Diagnosis not present

## 2016-11-16 DIAGNOSIS — I82409 Acute embolism and thrombosis of unspecified deep veins of unspecified lower extremity: Secondary | ICD-10-CM | POA: Diagnosis present

## 2016-11-16 DIAGNOSIS — Z96652 Presence of left artificial knee joint: Secondary | ICD-10-CM

## 2016-11-16 DIAGNOSIS — R000 Tachycardia, unspecified: Secondary | ICD-10-CM | POA: Diagnosis not present

## 2016-11-16 DIAGNOSIS — J90 Pleural effusion, not elsewhere classified: Secondary | ICD-10-CM | POA: Diagnosis not present

## 2016-11-16 DIAGNOSIS — R278 Other lack of coordination: Secondary | ICD-10-CM | POA: Diagnosis not present

## 2016-11-16 DIAGNOSIS — R410 Disorientation, unspecified: Secondary | ICD-10-CM | POA: Diagnosis not present

## 2016-11-16 DIAGNOSIS — M1990 Unspecified osteoarthritis, unspecified site: Secondary | ICD-10-CM | POA: Diagnosis present

## 2016-11-16 LAB — URINALYSIS, ROUTINE W REFLEX MICROSCOPIC
BACTERIA UA: NONE SEEN
BILIRUBIN URINE: NEGATIVE
Glucose, UA: NEGATIVE mg/dL
KETONES UR: 20 mg/dL — AB
LEUKOCYTES UA: NEGATIVE
Nitrite: NEGATIVE
Protein, ur: NEGATIVE mg/dL
Specific Gravity, Urine: 1.016 (ref 1.005–1.030)
pH: 5 (ref 5.0–8.0)

## 2016-11-16 LAB — CBC
HEMATOCRIT: 29.7 % — AB (ref 39.0–52.0)
Hemoglobin: 9.9 g/dL — ABNORMAL LOW (ref 13.0–17.0)
MCH: 30.6 pg (ref 26.0–34.0)
MCHC: 33.3 g/dL (ref 30.0–36.0)
MCV: 91.7 fL (ref 78.0–100.0)
Platelets: 297 10*3/uL (ref 150–400)
RBC: 3.24 MIL/uL — AB (ref 4.22–5.81)
RDW: 15.2 % (ref 11.5–15.5)
WBC: 14.2 10*3/uL — AB (ref 4.0–10.5)

## 2016-11-16 LAB — BASIC METABOLIC PANEL
ANION GAP: 9 (ref 5–15)
BUN: 19 mg/dL (ref 6–20)
CHLORIDE: 103 mmol/L (ref 101–111)
CO2: 23 mmol/L (ref 22–32)
Calcium: 9.7 mg/dL (ref 8.9–10.3)
Creatinine, Ser: 1.49 mg/dL — ABNORMAL HIGH (ref 0.61–1.24)
GFR calc non Af Amer: 44 mL/min — ABNORMAL LOW (ref >60)
GFR, EST AFRICAN AMERICAN: 50 mL/min — AB (ref >60)
GLUCOSE: 105 mg/dL — AB (ref 65–99)
POTASSIUM: 4.4 mmol/L (ref 3.5–5.1)
Sodium: 135 mmol/L (ref 135–145)

## 2016-11-16 LAB — STREP PNEUMONIAE URINARY ANTIGEN: Strep Pneumo Urinary Antigen: NEGATIVE

## 2016-11-16 LAB — PROTIME-INR
INR: 1.34
PROTHROMBIN TIME: 16.7 s — AB (ref 11.4–15.2)

## 2016-11-16 LAB — PROCALCITONIN: Procalcitonin: 0.25 ng/mL

## 2016-11-16 LAB — INFLUENZA PANEL BY PCR (TYPE A & B)
INFLAPCR: NEGATIVE
Influenza B By PCR: NEGATIVE

## 2016-11-16 LAB — LACTIC ACID, PLASMA: LACTIC ACID, VENOUS: 0.9 mmol/L (ref 0.5–1.9)

## 2016-11-16 LAB — I-STAT CG4 LACTIC ACID, ED: LACTIC ACID, VENOUS: 1.21 mmol/L (ref 0.5–1.9)

## 2016-11-16 MED ORDER — PIPERACILLIN-TAZOBACTAM 3.375 G IVPB 30 MIN
3.3750 g | Freq: Once | INTRAVENOUS | Status: AC
  Administered 2016-11-16: 3.375 g via INTRAVENOUS
  Filled 2016-11-16: qty 50

## 2016-11-16 MED ORDER — VANCOMYCIN HCL IN DEXTROSE 750-5 MG/150ML-% IV SOLN
750.0000 mg | Freq: Two times a day (BID) | INTRAVENOUS | Status: DC
  Administered 2016-11-17 – 2016-11-20 (×7): 750 mg via INTRAVENOUS
  Filled 2016-11-16 (×8): qty 150

## 2016-11-16 MED ORDER — ONDANSETRON HCL 4 MG PO TABS: 4.0000 mg | ORAL_TABLET | Freq: Four times a day (QID) | ORAL | Status: DC | PRN

## 2016-11-16 MED ORDER — LEVALBUTEROL HCL 0.63 MG/3ML IN NEBU
0.6300 mg | INHALATION_SOLUTION | Freq: Four times a day (QID) | RESPIRATORY_TRACT | Status: DC | PRN
  Filled 2016-11-16: qty 3

## 2016-11-16 MED ORDER — ACETAMINOPHEN 325 MG PO TABS
650.0000 mg | ORAL_TABLET | Freq: Once | ORAL | Status: AC
  Administered 2016-11-16: 650 mg via ORAL
  Filled 2016-11-16: qty 2

## 2016-11-16 MED ORDER — SODIUM CHLORIDE 0.9% FLUSH
3.0000 mL | Freq: Two times a day (BID) | INTRAVENOUS | Status: DC
  Administered 2016-11-16 – 2016-11-25 (×13): 3 mL via INTRAVENOUS

## 2016-11-16 MED ORDER — SIMVASTATIN 20 MG PO TABS
20.0000 mg | ORAL_TABLET | Freq: Every day | ORAL | Status: DC
  Administered 2016-11-16 – 2016-11-23 (×7): 20 mg via ORAL
  Filled 2016-11-16 (×9): qty 1

## 2016-11-16 MED ORDER — METHOCARBAMOL 500 MG PO TABS
750.0000 mg | ORAL_TABLET | Freq: Once | ORAL | Status: AC
  Administered 2016-11-16: 750 mg via ORAL
  Filled 2016-11-16: qty 2

## 2016-11-16 MED ORDER — ACETAMINOPHEN 500 MG PO TABS
500.0000 mg | ORAL_TABLET | Freq: Four times a day (QID) | ORAL | Status: DC | PRN
  Administered 2016-11-17 (×2): 500 mg via ORAL
  Filled 2016-11-16 (×2): qty 1

## 2016-11-16 MED ORDER — VANCOMYCIN HCL 10 G IV SOLR
1500.0000 mg | Freq: Once | INTRAVENOUS | Status: AC
  Administered 2016-11-16: 1500 mg via INTRAVENOUS
  Filled 2016-11-16: qty 1500

## 2016-11-16 MED ORDER — GUAIFENESIN ER 600 MG PO TB12
600.0000 mg | ORAL_TABLET | Freq: Two times a day (BID) | ORAL | Status: DC
  Administered 2016-11-16 – 2016-11-25 (×18): 600 mg via ORAL
  Filled 2016-11-16 (×19): qty 1

## 2016-11-16 MED ORDER — SODIUM CHLORIDE 0.9 % IV BOLUS (SEPSIS)
500.0000 mL | Freq: Once | INTRAVENOUS | Status: AC
  Administered 2016-11-16: 500 mL via INTRAVENOUS

## 2016-11-16 MED ORDER — SODIUM CHLORIDE 0.9 % IV SOLN
INTRAVENOUS | Status: DC
  Administered 2016-11-16 – 2016-11-19 (×6): via INTRAVENOUS

## 2016-11-16 MED ORDER — PIPERACILLIN-TAZOBACTAM 3.375 G IVPB
3.3750 g | Freq: Three times a day (TID) | INTRAVENOUS | Status: DC
  Administered 2016-11-17 – 2016-11-20 (×11): 3.375 g via INTRAVENOUS
  Filled 2016-11-16 (×12): qty 50

## 2016-11-16 MED ORDER — OXYCODONE-ACETAMINOPHEN 5-325 MG PO TABS
1.0000 | ORAL_TABLET | Freq: Once | ORAL | Status: AC
  Administered 2016-11-16: 1 via ORAL
  Filled 2016-11-16: qty 1

## 2016-11-16 MED ORDER — ADULT MULTIVITAMIN W/MINERALS CH
1.0000 | ORAL_TABLET | Freq: Every day | ORAL | Status: DC
  Administered 2016-11-16 – 2016-11-25 (×9): 1 via ORAL
  Filled 2016-11-16 (×10): qty 1

## 2016-11-16 MED ORDER — ENOXAPARIN SODIUM 40 MG/0.4ML ~~LOC~~ SOLN
40.0000 mg | SUBCUTANEOUS | Status: DC
  Administered 2016-11-17 – 2016-11-25 (×9): 40 mg via SUBCUTANEOUS
  Filled 2016-11-16 (×9): qty 0.4

## 2016-11-16 MED ORDER — TRAMADOL HCL 50 MG PO TABS
50.0000 mg | ORAL_TABLET | Freq: Four times a day (QID) | ORAL | Status: DC | PRN
  Administered 2016-11-17 (×2): 50 mg via ORAL
  Filled 2016-11-16 (×2): qty 1

## 2016-11-16 MED ORDER — LEVALBUTEROL HCL 0.63 MG/3ML IN NEBU
0.6300 mg | INHALATION_SOLUTION | Freq: Four times a day (QID) | RESPIRATORY_TRACT | Status: DC
  Administered 2016-11-16: 0.63 mg via RESPIRATORY_TRACT

## 2016-11-16 MED ORDER — ONDANSETRON HCL 4 MG/2ML IJ SOLN: 4.0000 mg | Freq: Four times a day (QID) | INTRAMUSCULAR | Status: DC | PRN

## 2016-11-16 MED ORDER — CO Q 10 100 MG PO CAPS: 1.0000 | ORAL_CAPSULE | Freq: Every day | ORAL | Status: DC

## 2016-11-16 MED ORDER — PANTOPRAZOLE SODIUM 40 MG PO TBEC
40.0000 mg | DELAYED_RELEASE_TABLET | Freq: Every day | ORAL | Status: DC
  Filled 2016-11-16: qty 1

## 2016-11-16 MED ORDER — MIRABEGRON ER 50 MG PO TB24
50.0000 mg | ORAL_TABLET | Freq: Every day | ORAL | Status: DC
  Administered 2016-11-17 – 2016-11-25 (×9): 50 mg via ORAL
  Filled 2016-11-16 (×9): qty 1

## 2016-11-16 NOTE — Progress Notes (Signed)
Pharmacy Antibiotic Note  Jeff Rivera is a 76 y.o. male admitted on 11/16/2016 with hip pain. Pharmacy has been consulted for Vancomycin + Zosyn for r/o PNA coverage. SCr 1.49, CrCl~40-50 ml/min.   Plan: 1. Vancomycin 1500 mg IV followed by 750 mg IV every 12 hours 2. Zosyn 3.375g IV every 8 hours 3. Will continue to follow renal function, culture results, LOT, and antibiotic de-escalation plans    Temp (24hrs), Avg:101 F (38.3 C), Min:99.5 F (37.5 C), Max:102.5 F (39.2 C)   Recent Labs Lab 11/14/16 0651 11/14/16 1416 11/15/16 0826 11/16/16 1449 11/16/16 1719  WBC 6.9 6.3 7.1 14.2*  --   CREATININE  --  1.72* 1.53* 1.49*  --   LATICACIDVEN  --   --   --   --  1.21    Estimated Creatinine Clearance: 43.2 mL/min (by C-G formula based on SCr of 1.49 mg/dL (H)).    No Known Allergies  Antimicrobials this admission: Vanc 2/4 >> Zosyn 2/4 >>  Dose adjustments this admission:   Microbiology results: 2/4 BCx >>  Thank you for allowing pharmacy to be a part of this patient's care.  Lawson Radar 11/16/2016 5:46 PM

## 2016-11-16 NOTE — ED Notes (Signed)
Pt out of room for testing. 

## 2016-11-16 NOTE — ED Provider Notes (Signed)
Assumed care from Dr. Ashok Cordia at 5 PM. Briefly, the patient is a 76 yo M with PMHx AS, CAD, HTN, CAD, recent DVT and IVC filter placement here with fever, generalized fatigue, and confusion. Lab work shows leukocytosis of 14k and CXR c/f PNA. Will cover for HCAP and admit to hospitalist. UA pending.   Duffy Bruce, MD 11/16/16 [phone]

## 2016-11-16 NOTE — ED Notes (Signed)
Patient transported to CT 

## 2016-11-16 NOTE — ED Triage Notes (Signed)
Pt having rt hip pain after procedure on Friday to place a screen in his artery in leg, pt had been on xeralto for dvt and had black tarry stools so dr took him off and then placed screen on Friday,immedialy after screen placed  On Friday pt had sever rt hip pain and leg swelling and was kept over night  Given pain meds and sent home, today pain is back rt leg is swollen and very firm to touch and tender , pt is aaox4 , per ems family states that   Pt has not walked or gotten up  Since he has been home

## 2016-11-16 NOTE — Progress Notes (Signed)
PHARMACIST - PHYSICIAN ORDER COMMUNICATION  CONCERNING: P&T Medication Policy on Herbal Medications  DESCRIPTION:  This patient's order for:  Coenzyme Q10  has been noted.  This product(s) is classified as an "herbal" or natural product. Due to a lack of definitive safety studies or FDA approval, nonstandard manufacturing practices, plus the potential risk of unknown drug-drug interactions while on inpatient medications, the Pharmacy and Therapeutics Committee does not permit the use of "herbal" or natural products of this type within [facility].   ACTION TAKEN: The pharmacy department is unable to verify this order at this time and your patient has been informed of this safety policy. Please reevaluate patient's clinical condition at discharge and address if the herbal or natural product(s) should be resumed at that time.  Thank you for allowing pharmacy to be a part of this patient's care.  Alycia Rossetti, PharmD, BCPS Clinical Pharmacist 11/16/2016 8:18 PM

## 2016-11-16 NOTE — ED Notes (Signed)
Family taking belongings home

## 2016-11-16 NOTE — Progress Notes (Signed)
Received report on pt.

## 2016-11-16 NOTE — H&P (Signed)
History and Physical    Jeff Rivera [id number] DOB: 1940-10-19 DOA: 11/16/2016  Referring MD/NP/PA: Bland Span, PA  PCP: Thressa Sheller, MD  Patient coming from: Home  Chief Complaint: confusion   HPI: Jeff Rivera is a 76 y.o. with known hx of HTN, CKD stage III, chronic GI bleed, recent dx of DVT back in December when he was initially placed on Xarelto but subsequently developed GI bleed, just discharged 2/3 (was hospitalized from 2/2 - 2/3 for IVC filter placement), now presented from home by his son for evaluation of progressively worsening confusion, poor oral intake since being discharged. Please note that son is not present at the time of the admission and pt still somewhat confused, unable to provide much of the details why he is here. He currently denies chest pain but reports some dyspnea when moving around. No reported fevers.   In ED, pt noted to be confused, VS notable for T 102.5 F, HR up to 120's, RR up to 23, WBC up to 14 K. CXR worrisome for LLL PNA. TRH asked to admit to telemetry unit.    Review of Systems:  Unable to obtain details due to pt's confusion in the setting of acute illness.   Past Medical History:  Diagnosis Date  . Aortic stenosis    mild   . Arthritis    OA  . CAD (coronary artery disease)   . Chronic kidney disease    creatine 1.5 was 1.5 09/2015  . Complication of anesthesia    patient was confused for 6-8 days.  doesnt remember anything from time he registered several days after he was admitted to [facility]  . Dyslipidemia   . ED (erectile dysfunction)   . GERD (gastroesophageal reflux disease)   . Heart murmur   . History of kidney stones   . Hypertension     Past Surgical History:  Procedure Laterality Date  . CARDIAC CATHETERIZATION  01/2010   40% LAD lesion (Dr. Loni Muse. [place])   . COLONOSCOPY    . COLONOSCOPY    . CYSTOSCOPY    . EYE SURGERY Bilateral 2013   LENS REPLACMENT FOR CATRACTS  . KNEE ARTHROPLASTY Right 07/14/2016   Procedure: COMPUTER NAVIGATION ASSISTED TOTAL KNEE ARTHROPLASTY RIGHT;  Surgeon: Rod Can, MD;  Location: [place];  Service: Orthopedics;  Laterality: Right;  . PERIPHERAL VASCULAR CATHETERIZATION N/A 11/14/2016   Procedure: IVC Filter Insertion;  Surgeon: Adrian Prows, MD;  Location: [place] [facility];  Service: Cardiovascular;  Laterality: N/A;  . TIE OFF FOR LOW SPERM COUNT  50 YRS AGO  . TOTAL KNEE ARTHROPLASTY Left 10/27/2014   Procedure: LEFT TOTAL KNEE ARTHROPLASTY;  Surgeon: Mcarthur Rossetti, MD;  Location: [facility];  Service: Orthopedics;  Laterality: Left;  . TOTAL KNEE ARTHROPLASTY Right 07/15/2016  . TRANSTHORACIC ECHOCARDIOGRAM  09/2011   EF=>55%, mild conc LVH; normal RV systolic function; LA mildly dilated, IVC borderline dilated with collapse >50%; mild mitral annular calcif & mild MR; trace TR, elevated RVSP; AV mildly sclerotic, mild calcif of AV leaflets, mild valvular AS, mild regurg; trace pulm valve regurg   Social Hx:  reports that he quit smoking about 55 years ago. His smoking use included Cigarettes. He quit after 4.00 years of use. He has never used smokeless tobacco. He reports that he drinks about 2.4 oz of alcohol per week . He reports that he does not use drugs.  No Known Allergies  Family History  Problem Relation Age of Onset  .  Heart attack Father   . Coronary artery disease Father     Prior to Admission medications   Medication Sig Start Date End Date Taking? Authorizing Provider  acetaminophen (TYLENOL) 500 MG tablet Take 500 mg by mouth every 6 (six) hours as needed for mild pain.   Yes Historical Provider, MD  Coenzyme Q10 (CO Q 10) 100 MG CAPS Take 1 capsule by mouth daily.    Yes Historical Provider, MD  mirabegron ER (MYRBETRIQ) 50 MG TB24 tablet Take 50 mg by mouth daily.   Yes Historical Provider, MD  Multiple Vitamin (MULTIVITAMIN) tablet Take 1 tablet by mouth daily.   Yes Historical Provider, MD  omeprazole (PRILOSEC) 20 MG capsule Take 40  mg by mouth daily.    Yes Historical Provider, MD  simvastatin (ZOCOR) 20 MG tablet Take 20 mg by mouth every evening.    Yes Historical Provider, MD  traMADol (ULTRAM) 50 MG tablet Take 1-2 tablets (50-100 mg total) by mouth every 6 (six) hours as needed for moderate pain. 07/16/16  Yes Rod Can, MD  furosemide (LASIX) 40 MG tablet Take 1 tablet (40 mg total) by mouth daily as needed for fluid. 11/17/16   Velvet Bathe, MD    Physical Exam: Vitals:   11/16/16 1800 11/16/16 1815 11/16/16 1845 11/16/16 1845  BP: 124/60 125/55 130/62   Pulse: 113 115 111   Resp:  18 23   Temp:    99.6 F (37.6 C)  TempSrc:    Oral  SpO2: 91% 93% 95%     Constitutional: NAD, confused but able to follow some simple commands  Vitals:   11/16/16 1800 11/16/16 1815 11/16/16 1845 11/16/16 1845  BP: 124/60 125/55 130/62   Pulse: 113 115 111   Resp:  18 23   Temp:    99.6 F (37.6 C)  TempSrc:    Oral  SpO2: 91% 93% 95%    Eyes: PERRL, lids and conjunctivae normal ENMT: Mucous membranes are dry. Posterior pharynx clear of any exudate or lesions.Normal dentition.  Neck: normal, supple, no masses, no thyromegaly Respiratory: rhonchi at bases, diminished breath sounds at bases  Cardiovascular: Regular rhythm, tachycardic, no murmurs / rubs / gallops. No extremity edema. 2+ pedal pulses. Abdomen: no tenderness, no masses palpated. No hepatosplenomegaly. Bowel sounds positive.  Musculoskeletal: no clubbing / cyanosis. No joint deformity upper and lower extremities.  Skin: no rashes, lesions, ulcers. Poor skin turgor  Neurologic: alert but confused, orient to name and DOB, moving all 4 extremities spontaneously  Psychiatric: Difficult to assess due to confusion   Labs on Admission: I have personally reviewed following labs and imaging studies  CBC:  Recent Labs Lab 11/14/16 0651 11/14/16 1416 11/14/16 2328 11/15/16 0826 11/16/16 1449  WBC 6.9 6.3  --  7.1 14.2*  HGB 7.8* 7.3* 8.3* 8.6* 9.9*    HCT 24.0* 22.8* 25.1* 26.0* 29.7*  MCV 95.2 95.4  --  93.9 91.7  PLT 268 248  --  219 [id number]   Basic Metabolic Panel:  Recent Labs Lab 11/14/16 1416 11/15/16 0826 11/16/16 1449  NA  --  137 135  K  --  4.4 4.4  CL  --  108 103  CO2  --  26 23  GLUCOSE  --  97 105*  BUN  --  19 19  CREATININE 1.72* 1.53* 1.49*  CALCIUM  --  8.6* 9.7   Urine analysis: pending   Radiological Exams on Admission: Dg Lumbar Spine Complete  Result Date: 11/16/2016  CLINICAL DATA:  Low back and right hip pain, worsening. EXAM: LUMBAR SPINE - COMPLETE 4+ VIEW COMPARISON:  Abdominal x-ray dated 09/25/2008 and CT abdomen dated 09/20/2008. FINDINGS: Mild levoscoliosis, likely accentuated by patient positioning. Alignment appears otherwise stable. No fracture line or displaced fracture fragment identified. No acute or suspicious osseous lesion. No compression fracture deformity. Upper sacrum appears intact and normal in mineralization. Mild disc desiccations at each level, with slight disc space narrowings and minimal osseous spurring. No evidence of advanced degenerative change at any level. IVC filter in place with tip at the L1 vertebral body level. Aortic atherosclerosis. Visualized paravertebral soft tissues are otherwise unremarkable. Fairly large amount of stool within the right colon. IMPRESSION: 1. No acute or significant osseous finding. Very mild degenerative change within the lumbar spine. Mild scoliosis. 2. Aortic atherosclerosis. 3. Fairly large amount of stool within the right colon and rectal vault (constipation?). Electronically Signed   By: Franki Cabot M.D.   On: 11/16/2016 14:40   Dg Chest Port 1 View  Result Date: 11/16/2016 CLINICAL DATA:  Possible fever; Complications from anesthesia; Had IVC filter placed for DVTs in right leg 3 days ago; Right leg is now swollen, firm and tender; the patient has extreme back and right hip pain; Hx of HTN, CKD, CAD, aortic stenosis EXAM: PORTABLE CHEST 1 VIEW  COMPARISON:  07/24/2016 FINDINGS: Shallow lung inflation. Heart size is normal. Patchy infiltrate is identified in the left lung base. There is subsegmental atelectasis in the right lower lobe. No pulmonary edema. IMPRESSION: Left lower lobe infiltrate.  Right lower lobe atelectasis. Electronically Signed   By: Nolon Nations M.D.   On: 11/16/2016 17:03   Dg Hip Unilat W Or W/o Pelvis 2-3 Views Right  Result Date: 11/16/2016 CLINICAL DATA:  Low back and right hip pain, worsening. No known trauma. EXAM: DG HIP (WITH OR WITHOUT PELVIS) 2-3V RIGHT COMPARISON:  None. FINDINGS: Single view of the pelvis and two views of the right hip are provided. Osseous alignment is normal. Bone mineralization is normal. No fracture line or displaced fracture fragment identified. No acute or suspicious osseous lesion. Mild degenerative joint space narrowing at the right hip, with associated mild osseous spurring. No evidence of advanced DJD. Soft tissues about the pelvis and right hip are unremarkable. IMPRESSION: 1. No acute findings. 2. Mild degenerative change at the right hip joint. Electronically Signed   By: Franki Cabot M.D.   On: 11/16/2016 14:41    EKG: pending   Assessment/Plan Active Problems: Sepsis secondary to HCAP, LLL, PNA, unknown pathogen - admit to telemetry unit - agree with broad spectrum ABX coverage Vanc and zosyn for now - sepsis and PNA order set in place - sputum and blood cultures pending  - follow up on strep pneumo, influenza panel  - supportive care with IVF, antiemetics as needed - hold Lasix    CKD stage III - holding lasix for now - IVF tonight and if pt better in AM, can stop IVF - BMP in AM  Anemia of chronic GI bleed - no evidence of active bleeding - CBC in AM  DVT, recent - in LLE, after knee replacement  - has been off of Xarelto - IVC filter placed on 2/2  DVT prophylaxis: Lovenox SQ Code Status: Full  Family Communication: Pt updated at bedside Disposition  Plan: Admit to telemetry  Consults called: None Admission status: Inpatient   Faye Ramsay MD [facility] Pager [phone]  If 7PM-7AM, please contact night-coverage  [email or web address] Password [facility]  11/16/2016, 6:56 PM

## 2016-11-16 NOTE — ED Provider Notes (Signed)
[facility] DEPT Provider Note   CSN: [id number] Arrival date & time: 11/16/16  1334     History   Chief Complaint Chief Complaint  Patient presents with  . Hip Pain    HPI Jeff Rivera is a 76 y.o. male.  Patient c/o low back pain and right hip and leg pain for the past few days. States feels as though pain starts in lower back, worse on right, and radiates down right leg.  Denies hx DDD. Does have dvt in right leg, but due to recent gi bleeding, and recent falls, was felt anticoag therapy had to be stopped, and had IVC filter placed 2 days ago. Pt indicates he had c/o of the low back and leg pain then, and that pain has persisted. Pain constant, dull, mod-severe, worse w certain movement and position changes. No leg numbness/weakness. No increased in leg swelling. No fever or chills. Denies chest pain or sob.   Family member/son arrives - adds to history.  States he found patient today, in chair, very weak/too weak to walk, confused, states home/area smelled of urine (and states normally patient is the one who takes care of self, and spouse, but that in current condition can do neither).  States he is not sure of cause of symptoms. Does indicate after prior anesthesia/surgical procedures, that pt was slow to recover, and needed rehab for 1-2 weeks.        The history is provided by the patient.  Hip Pain  Pertinent negatives include no chest pain, no abdominal pain, no headaches and no shortness of breath.    Past Medical History:  Diagnosis Date  . Aortic stenosis    mild   . Arthritis    OA  . CAD (coronary artery disease)   . Chronic kidney disease    creatine 1.5 was 1.5 09/2015  . Complication of anesthesia    patient was confused for 6-8 days.  doesnt remember anything from time he registered several days after he was admitted to [facility]  . Dyslipidemia   . ED (erectile dysfunction)   . GERD (gastroesophageal reflux disease)   . Heart murmur   . History of  kidney stones   . Hypertension     Patient Active Problem List   Diagnosis Date Noted  . Acute deep vein thrombosis (DVT) of femoral vein of right lower extremity ([place]) 11/14/2016  . Acute blood loss anemia 11/14/2016  . DVT (deep venous thrombosis) ([place]) 11/13/2016  . Anemia 07/24/2016  . 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 CAD (coronary artery disease) 09/12/2013  . Aortic stenosis 09/12/2013  . Dyslipidemia 09/12/2013  . HTN (hypertension) 09/12/2013    Past Surgical History:  Procedure Laterality Date  . CARDIAC CATHETERIZATION  01/2010   40% LAD lesion (Dr. Loni Muse. [place])   . COLONOSCOPY    . COLONOSCOPY    . CYSTOSCOPY    . EYE SURGERY Bilateral 2013   LENS REPLACMENT FOR CATRACTS  . KNEE ARTHROPLASTY Right 07/14/2016   Procedure: COMPUTER NAVIGATION ASSISTED TOTAL KNEE ARTHROPLASTY RIGHT;  Surgeon: Rod Can, MD;  Location: [place];  Service: Orthopedics;  Laterality: Right;  . PERIPHERAL VASCULAR CATHETERIZATION N/A 11/14/2016   Procedure: IVC Filter Insertion;  Surgeon: Adrian Prows, MD;  Location: [place] [facility];  Service: Cardiovascular;  Laterality: N/A;  . TIE OFF FOR LOW SPERM COUNT  50 YRS AGO  . TOTAL KNEE ARTHROPLASTY Left 10/27/2014   Procedure: LEFT TOTAL KNEE ARTHROPLASTY;  Surgeon: Harrell Gave  Kerry Fort, MD;  Location: [facility];  Service: Orthopedics;  Laterality: Left;  . TOTAL KNEE ARTHROPLASTY Right 07/15/2016  . TRANSTHORACIC ECHOCARDIOGRAM  09/2011   EF=>55%, mild conc LVH; normal RV systolic function; LA mildly dilated, IVC borderline dilated with collapse >50%; mild mitral annular calcif & mild MR; trace TR, elevated RVSP; AV mildly sclerotic, mild calcif of AV leaflets, mild valvular AS, mild regurg; trace pulm valve regurg       Home Medications    Prior to Admission medications   Medication Sig Start Date End Date Taking? Authorizing Provider  acetaminophen (TYLENOL) 500 MG tablet Take 500 mg by mouth every 6 (six) hours as needed for  mild pain.    Historical Provider, MD  Coenzyme Q10 (CO Q 10) 100 MG CAPS Take 1 capsule by mouth daily.     Historical Provider, MD  furosemide (LASIX) 40 MG tablet Take 1 tablet (40 mg total) by mouth daily as needed for fluid. 11/17/16   Velvet Bathe, MD  mirabegron ER (MYRBETRIQ) 50 MG TB24 tablet Take 50 mg by mouth daily.    Historical Provider, MD  Multiple Vitamin (MULTIVITAMIN) tablet Take 1 tablet by mouth daily.    Historical Provider, MD  omeprazole (PRILOSEC) 20 MG capsule Take 40 mg by mouth daily.     Historical Provider, MD  simvastatin (ZOCOR) 20 MG tablet Take 20 mg by mouth every evening.     Historical Provider, MD  traMADol (ULTRAM) 50 MG tablet Take 1-2 tablets (50-100 mg total) by mouth every 6 (six) hours as needed for moderate pain. Patient taking differently: Take 50 mg by mouth every 6 (six) hours as needed for moderate pain.  07/16/16   Rod Can, MD    Family History Family History  Problem Relation Age of Onset  . Heart attack Father   . Coronary artery disease Father     Social History Social History  Substance Use Topics  . Smoking status: Former Smoker    Years: 4.00    Types: Cigarettes    Quit date: 09/05/1961  . Smokeless tobacco: Never Used  . Alcohol use 2.4 oz/week    1 Standard drinks or equivalent, 3 Glasses of wine per week     Comment: 3-4 GLASSES WINE PER WEEK     Allergies   Patient has no known allergies.   Review of Systems Review of Systems  Constitutional: Negative for fever.  HENT: Negative for sore throat.   Eyes: Negative for redness.  Respiratory: Negative for shortness of breath.   Cardiovascular: Negative for chest pain.  Gastrointestinal: Negative for abdominal pain.  Genitourinary: Negative for flank pain.  Musculoskeletal: Positive for back pain. Negative for neck pain.  Skin: Negative for rash.  Neurological: Negative for headaches.  Hematological: Does not bruise/bleed easily.  Psychiatric/Behavioral:  Negative for confusion.     Physical Exam Updated Vital Signs BP 138/61 (BP Location: Right Arm)   Pulse 118   Temp 99.5 F (37.5 C) (Oral)   Resp 16   SpO2 95%   Physical Exam  Constitutional: He is oriented to person, place, and time. He appears well-developed and well-nourished. No distress.  HENT:  Mouth/Throat: Oropharynx is clear and moist.  Eyes: Conjunctivae are normal.  Neck: Neck supple. No tracheal deviation present.  Cardiovascular: Normal rate, regular rhythm, normal heart sounds and intact distal pulses.   Pulmonary/Chest: Effort normal. No accessory muscle usage. No respiratory distress.  Abdominal: Soft. He exhibits no distension. There is no tenderness.  Genitourinary:  Genitourinary Comments: No cva tenderness  Musculoskeletal: He exhibits no edema.  Lumbar tenderness diffusely, esp on right, otherwise, TLS spine non tender, aligned.  Tenderness right hip. Good passive rom at right hip, and knee without pain. Distal pulse palp.   Neurological: He is alert and oriented to person, place, and time.  Skin: Skin is warm and dry. No rash noted. He is not diaphoretic. No erythema.  Psychiatric: He has a normal mood and affect.  Nursing note and vitals reviewed.    ED Treatments / Results  Labs (all labs ordered are listed, but only abnormal results are displayed) Labs Reviewed  CBC  BASIC METABOLIC PANEL    EKG  EKG Interpretation None       Radiology No results found.  Procedures Procedures (including critical care time)  Medications Ordered in ED Medications  0.9 %  sodium chloride infusion (not administered)     Initial Impression / Assessment and Plan / ED Course  I have reviewed the triage vital signs and the nursing notes.  Pertinent labs & imaging results that were available during my care of the patient were reviewed by me and considered in my medical decision making (see chart for details).  Reviewed nursing notes and prior charts  for additional history.   Percocet po.   Labs.   Imaging studies.   Pt w low grade fever in ED, tachy - UA, cxr and lactate.  Feel likely patient will require admission.   Iv ns bolus. Pain is improved. Labs remain pending.   1700 signed out to Dr Crissie Reese to check lactate, ua, cxr when resulted, and facilitate admission.  If infection/source, or elevated lactate, would start iv abx.     Final Clinical Impressions(s) / ED Diagnoses   Final diagnoses:  None    New Prescriptions New Prescriptions   No medications on file     Lajean Saver, MD 11/16/16 1700

## 2016-11-16 NOTE — Progress Notes (Signed)
NURSING PROGRESS NOTE  Jeff Rivera [id number] Admission Data: 11/16/2016 11:44 PM Attending Provider: Theodis Blaze, MD [id number], MD Code Status: Full  Allergies:  Patient has no known allergies. Past Medical History:   has a past medical history of Aortic stenosis; Arthritis; CAD (coronary artery disease); Chronic kidney disease; Complication of anesthesia; Dyslipidemia; ED (erectile dysfunction); GERD (gastroesophageal reflux disease); Heart murmur; History of kidney stones; and Hypertension. Past Surgical History:   has a past surgical history that includes Cardiac catheterization (01/2010); transthoracic echocardiogram (09/2011); Eye surgery (Bilateral, 2013); TIE OFF FOR LOW SPERM COUNT (50 YRS AGO); Total knee arthroplasty (Left, 10/27/2014); Colonoscopy; Colonoscopy; Cystoscopy; Knee Arthroplasty (Right, 07/14/2016); Total knee arthroplasty (Right, 07/15/2016); and Cardiac catheterization (N/A, 11/14/2016). Social History:   reports that he quit smoking about 55 years ago. His smoking use included Cigarettes. He quit after 4.00 years of use. He has never used smokeless tobacco. He reports that he drinks about 2.4 oz of alcohol per week . He reports that he does not use drugs.  Jeff Rivera is a 76 y.o. male patient admitted from ED:   Last Documented Vital Signs: Blood pressure 134/66, pulse (!) 117, temperature 98.1 F (36.7 C), temperature source Oral, resp. rate 18, height 5' 7.5" (1.715 m), weight 79.4 kg (175 lb), SpO2 94 %.  Cardiac Monitoring: Box # 34 in place. Cardiac monitor yields:normal sinus rhythm.  IV Fluids:  IV in place, occlusive dsg intact without redness, IV cath antecubital left, condition patent and no redness normal saline.   Skin: Appropriate for ethnicity and intact.  Patient orientated to room. Admission inpatient armband information verified with patient to include name and date of birth and placed on patient arm. Side rails up x 2, fall assessment and  education completed with patient. Patient able to verbalize understanding of risk associated with falls and verbalized understanding to call for assistance before getting out of bed. Call light within reach. Patient able to voice and demonstrate understanding of unit orientation instructions.    Will continue to evaluate and treat per MD orders.  Clydell Hakim RN, BSN

## 2016-11-17 DIAGNOSIS — E785 Hyperlipidemia, unspecified: Secondary | ICD-10-CM

## 2016-11-17 LAB — BASIC METABOLIC PANEL
ANION GAP: 5 (ref 5–15)
BUN: 20 mg/dL (ref 6–20)
CO2: 26 mmol/L (ref 22–32)
Calcium: 9.2 mg/dL (ref 8.9–10.3)
Chloride: 107 mmol/L (ref 101–111)
Creatinine, Ser: 1.54 mg/dL — ABNORMAL HIGH (ref 0.61–1.24)
GFR calc non Af Amer: 42 mL/min — ABNORMAL LOW (ref >60)
GFR, EST AFRICAN AMERICAN: 48 mL/min — AB (ref >60)
GLUCOSE: 109 mg/dL — AB (ref 65–99)
POTASSIUM: 4.3 mmol/L (ref 3.5–5.1)
Sodium: 138 mmol/L (ref 135–145)

## 2016-11-17 LAB — CBC
HEMATOCRIT: 26.5 % — AB (ref 39.0–52.0)
HEMOGLOBIN: 8.9 g/dL — AB (ref 13.0–17.0)
MCH: 30.9 pg (ref 26.0–34.0)
MCHC: 33.6 g/dL (ref 30.0–36.0)
MCV: 92 fL (ref 78.0–100.0)
Platelets: 247 10*3/uL (ref 150–400)
RBC: 2.88 MIL/uL — AB (ref 4.22–5.81)
RDW: 15.2 % (ref 11.5–15.5)
WBC: 11.6 10*3/uL — ABNORMAL HIGH (ref 4.0–10.5)

## 2016-11-17 LAB — RESPIRATORY PANEL BY PCR
Adenovirus: NOT DETECTED
BORDETELLA PERTUSSIS-RVPCR: NOT DETECTED
CORONAVIRUS HKU1-RVPPCR: NOT DETECTED
Chlamydophila pneumoniae: NOT DETECTED
Coronavirus 229E: NOT DETECTED
Coronavirus NL63: NOT DETECTED
Coronavirus OC43: NOT DETECTED
INFLUENZA A-RVPPCR: NOT DETECTED
INFLUENZA B-RVPPCR: NOT DETECTED
METAPNEUMOVIRUS-RVPPCR: NOT DETECTED
Mycoplasma pneumoniae: NOT DETECTED
PARAINFLUENZA VIRUS 2-RVPPCR: NOT DETECTED
PARAINFLUENZA VIRUS 3-RVPPCR: NOT DETECTED
PARAINFLUENZA VIRUS 4-RVPPCR: NOT DETECTED
Parainfluenza Virus 1: NOT DETECTED
RESPIRATORY SYNCYTIAL VIRUS-RVPPCR: NOT DETECTED
RHINOVIRUS / ENTEROVIRUS - RVPPCR: NOT DETECTED

## 2016-11-17 LAB — LACTIC ACID, PLASMA: LACTIC ACID, VENOUS: 0.8 mmol/L (ref 0.5–1.9)

## 2016-11-17 MED ORDER — ACETAMINOPHEN 500 MG PO TABS: 1000.0000 mg | ORAL_TABLET | Freq: Four times a day (QID) | ORAL | Status: DC | PRN

## 2016-11-17 MED ORDER — TRAMADOL HCL 50 MG PO TABS
100.0000 mg | ORAL_TABLET | Freq: Four times a day (QID) | ORAL | Status: DC | PRN
  Administered 2016-11-18 – 2016-11-25 (×7): 100 mg via ORAL
  Filled 2016-11-17 (×7): qty 2

## 2016-11-17 MED ORDER — HYDROCODONE-ACETAMINOPHEN 5-325 MG PO TABS
1.0000 | ORAL_TABLET | Freq: Four times a day (QID) | ORAL | Status: DC | PRN
  Administered 2016-11-17 (×2): 1 via ORAL
  Filled 2016-11-17 (×2): qty 1

## 2016-11-17 MED ORDER — PANTOPRAZOLE SODIUM 40 MG PO TBEC
40.0000 mg | DELAYED_RELEASE_TABLET | Freq: Every day | ORAL | Status: DC
  Administered 2016-11-17 – 2016-11-25 (×9): 40 mg via ORAL
  Filled 2016-11-17 (×8): qty 1

## 2016-11-17 NOTE — Progress Notes (Signed)
PROGRESS NOTE    Jeff Rivera  [id number] DOB: 12-30-1940 DOA: 11/16/2016 PCP: Thressa Sheller, MD    Brief Narrative:   76 y.o. with known hx of HTN, CKD stage III, chronic GI bleed, recent dx of DVT back in December when he was initially placed on Xarelto but subsequently developed GI bleed, just discharged 2/3 (was hospitalized from 2/2 - 2/3 for IVC filter placement), now presented from home by his son for evaluation of progressively worsening confusion, poor oral intake since being discharged   Assessment & Plan:    Acute deep vein thrombosis (DVT) of femoral vein of right lower extremity ([place]) - IVC filter placed secondary to GI bleed. xarelto held - Pain control an issue, will d/c tramadol and place on norco    HCAP (healthcare-associated pneumonia) - Will continue antibiotic regimen.  Active Problems:   CAD (coronary artery disease) - continue statin - no chest pain reported    Dyslipidemia  - stable on statin.   DVT prophylaxis: Lovenox Code Status: Full Family Communication: d/c family at bedside. Disposition Plan: pending continued improvement in condition   Consultants:   none   Procedures: none   Antimicrobials: vancomycin, zosyn   Subjective: Pt complaints of neck pain. Family reports some confusion,   Objective: Vitals:   11/16/16 2014 11/16/16 2037 11/17/16 0534 11/17/16 1422  BP: 134/66  126/62 (!) 130/55  Pulse: (!) 117  (!) 112 (!) 113  Resp: 18  18 18   Temp: 98.1 F (36.7 C)  98.1 F (36.7 C) 98.6 F (37 C)  TempSrc: Oral  Oral   SpO2: 92% 94% 94% 98%  Weight: 79.4 kg (175 lb)     Height: 5' 7.5" (1.715 m)       Intake/Output Summary (Last 24 hours) at 11/17/16 1721 Last data filed at 11/17/16 1207  Gross per 24 hour  Intake           2337.5 ml  Output             1050 ml  Net           1287.5 ml   Filed Weights   11/16/16 2014  Weight: 79.4 kg (175 lb)    Examination:  General exam: Appears calm and comfortable, in  nad. Respiratory system: Clear to auscultation. Respiratory effort normal. Equal chest rise. Decreased breath sounds of left base Cardiovascular system: S1 & S2 heard, RRR. No JVD, murmurs, rubs, gallops or clicks. No pedal edema. Gastrointestinal system: Abdomen is nondistended, soft and nontender. No organomegaly or masses felt. Normal bowel sounds heard. Central nervous system: Alert and Awake, No focal neurological deficits. Extremities: Symmetric 5 x 5 power. Skin: No rashes, lesions or ulcers on limited exam, on limited exam. Musculoskeletal: Neck pain with palpation Psychiatry:Mood & affect appropriate.     Data Reviewed: I have personally reviewed following labs and imaging studies  CBC:  Recent Labs Lab 11/14/16 0651 11/14/16 1416 11/14/16 2328 11/15/16 0826 11/16/16 1449 11/17/16 0027  WBC 6.9 6.3  --  7.1 14.2* 11.6*  HGB 7.8* 7.3* 8.3* 8.6* 9.9* 8.9*  HCT 24.0* 22.8* 25.1* 26.0* 29.7* 26.5*  MCV 95.2 95.4  --  93.9 91.7 92.0  PLT 268 248  --  219 297 [id number]   Basic Metabolic Panel:  Recent Labs Lab 11/14/16 1416 11/15/16 0826 11/16/16 1449 11/17/16 0027  NA  --  137 135 138  K  --  4.4 4.4 4.3  CL  --  108 103 107  CO2  --  26 23 26   GLUCOSE  --  97 105* 109*  BUN  --  19 19 20   CREATININE 1.72* 1.53* 1.49* 1.54*  CALCIUM  --  8.6* 9.7 9.2   GFR: Estimated Creatinine Clearance: 38.2 mL/min (by C-G formula based on SCr of 1.54 mg/dL (H)). Liver Function Tests: No results for input(s): AST, ALT, ALKPHOS, BILITOT, PROT, ALBUMIN in the last 168 hours. No results for input(s): LIPASE, AMYLASE in the last 168 hours. No results for input(s): AMMONIA in the last 168 hours. Coagulation Profile:  Recent Labs Lab 11/16/16 2150  INR 1.34   Cardiac Enzymes: No results for input(s): CKTOTAL, CKMB, CKMBINDEX, TROPONINI in the last 168 hours. BNP (last 3 results) No results for input(s): PROBNP in the last 8760 hours. HbA1C: No results for input(s): HGBA1C in  the last 72 hours. CBG: No results for input(s): GLUCAP in the last 168 hours. Lipid Profile: No results for input(s): CHOL, HDL, LDLCALC, TRIG, CHOLHDL, LDLDIRECT in the last 72 hours. Thyroid Function Tests: No results for input(s): TSH, T4TOTAL, FREET4, T3FREE, THYROIDAB in the last 72 hours. Anemia Panel: No results for input(s): VITAMINB12, FOLATE, FERRITIN, TIBC, IRON, RETICCTPCT in the last 72 hours. Sepsis Labs:  Recent Labs Lab 11/16/16 1719 11/16/16 2150 11/17/16 0027  PROCALCITON  --  0.25  --   LATICACIDVEN 1.21 0.9 0.8    Recent Results (from the past 240 hour(s))  Blood culture (routine x 2)     Status: None (Preliminary result)   Collection Time: 11/16/16  4:56 PM  Result Value Ref Range Status   Specimen Description BLOOD RIGHT WRIST  Final   Special Requests BOTTLES DRAWN AEROBIC AND ANAEROBIC 5CC  Final   Culture NO GROWTH < 24 HOURS  Final   Report Status PENDING  Incomplete  Blood culture (routine x 2)     Status: None (Preliminary result)   Collection Time: 11/16/16  5:00 PM  Result Value Ref Range Status   Specimen Description BLOOD LEFT WRIST  Final   Special Requests AEROBIC BOTTLE ONLY 10CC  Final   Culture NO GROWTH < 24 HOURS  Final   Report Status PENDING  Incomplete  Respiratory Panel by PCR     Status: None   Collection Time: 11/16/16  7:47 PM  Result Value Ref Range Status   Adenovirus NOT DETECTED NOT DETECTED Final   Coronavirus 229E NOT DETECTED NOT DETECTED Final   Coronavirus HKU1 NOT DETECTED NOT DETECTED Final   Coronavirus NL63 NOT DETECTED NOT DETECTED Final   Coronavirus OC43 NOT DETECTED NOT DETECTED Final   Metapneumovirus NOT DETECTED NOT DETECTED Final   Rhinovirus / Enterovirus NOT DETECTED NOT DETECTED Final   Influenza A NOT DETECTED NOT DETECTED Final   Influenza B NOT DETECTED NOT DETECTED Final   Parainfluenza Virus 1 NOT DETECTED NOT DETECTED Final   Parainfluenza Virus 2 NOT DETECTED NOT DETECTED Final    Parainfluenza Virus 3 NOT DETECTED NOT DETECTED Final   Parainfluenza Virus 4 NOT DETECTED NOT DETECTED Final   Respiratory Syncytial Virus NOT DETECTED NOT DETECTED Final   Bordetella pertussis NOT DETECTED NOT DETECTED Final   Chlamydophila pneumoniae NOT DETECTED NOT DETECTED Final   Mycoplasma pneumoniae NOT DETECTED NOT DETECTED Final         Radiology Studies: Dg Lumbar Spine Complete  Result Date: 11/16/2016 CLINICAL DATA:  Low back and right hip pain, worsening. EXAM: LUMBAR SPINE - COMPLETE 4+ VIEW COMPARISON:  Abdominal x-ray dated 09/25/2008  and CT abdomen dated 09/20/2008. FINDINGS: Mild levoscoliosis, likely accentuated by patient positioning. Alignment appears otherwise stable. No fracture line or displaced fracture fragment identified. No acute or suspicious osseous lesion. No compression fracture deformity. Upper sacrum appears intact and normal in mineralization. Mild disc desiccations at each level, with slight disc space narrowings and minimal osseous spurring. No evidence of advanced degenerative change at any level. IVC filter in place with tip at the L1 vertebral body level. Aortic atherosclerosis. Visualized paravertebral soft tissues are otherwise unremarkable. Fairly large amount of stool within the right colon. IMPRESSION: 1. No acute or significant osseous finding. Very mild degenerative change within the lumbar spine. Mild scoliosis. 2. Aortic atherosclerosis. 3. Fairly large amount of stool within the right colon and rectal vault (constipation?). Electronically Signed   By: Franki Cabot M.D.   On: 11/16/2016 14:40   Ct Head Wo Contrast  Result Date: 11/16/2016 CLINICAL DATA:  Altered mental status.  Confusion. EXAM: CT HEAD WITHOUT CONTRAST TECHNIQUE: Contiguous axial images were obtained from the base of the skull through the vertex without intravenous contrast. COMPARISON:  Brain MRI 12/14/2012 FINDINGS: Brain: No evidence of acute infarction, hemorrhage, extra-axial  collection or mass lesion/mass effect. 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 Microvascular ischemic changes mild. Vascular: Atherosclerotic calcification. Skull: No acute or aggressive finding Sinuses/Orbits: Bilateral cataract resection IMPRESSION: 1. No acute finding. 2. 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 Electronically Signed   By: Monte Fantasia M.D.   On: 11/16/2016 19:00   Dg Chest Port 1 View  Result Date: 11/16/2016 CLINICAL DATA:  Possible fever; Complications from anesthesia; Had IVC filter placed for DVTs in right leg 3 days ago; Right leg is now swollen, firm and tender; the patient has extreme back and right hip pain; Hx of HTN, CKD, CAD, aortic stenosis EXAM: PORTABLE CHEST 1 VIEW COMPARISON:  07/24/2016 FINDINGS: Shallow lung inflation. Heart size is normal. Patchy infiltrate is identified in the left lung base. There is subsegmental atelectasis in the right lower lobe. No pulmonary edema. IMPRESSION: Left lower lobe infiltrate.  Right lower lobe atelectasis. Electronically Signed   By: Nolon Nations M.D.   On: 11/16/2016 17:03   Dg Hip Unilat W Or W/o Pelvis 2-3 Views Right  Result Date: 11/16/2016 CLINICAL DATA:  Low back and right hip pain, worsening. No known trauma. EXAM: DG HIP (WITH OR WITHOUT PELVIS) 2-3V RIGHT COMPARISON:  None. FINDINGS: Single view of the pelvis and two views of the right hip are provided. Osseous alignment is normal. Bone mineralization is normal. No fracture line or displaced fracture fragment identified. No acute or suspicious osseous lesion. Mild degenerative joint space narrowing at the right hip, with associated mild osseous spurring. No evidence of advanced DJD. Soft tissues about the pelvis and right hip are unremarkable. IMPRESSION: 1. No acute findings. 2. Mild degenerative change at the right hip joint. Electronically Signed   By: Franki Cabot M.D.   On: 11/16/2016 14:41         Scheduled Meds: . enoxaparin (LOVENOX) injection  40 mg Subcutaneous Q24H  . guaiFENesin  600 mg Oral BID  . mirabegron ER  50 mg Oral Daily  . multivitamin with minerals  1 tablet Oral Daily  . pantoprazole  40 mg Oral Daily  . piperacillin-tazobactam (ZOSYN)  IV  3.375 g Intravenous Q8H  . simvastatin  20 mg Oral q1800  . sodium chloride flush  3 mL Intravenous Q12H  . vancomycin  750 mg Intravenous Q12H  Continuous Infusions: . sodium chloride 75 mL/hr at 11/17/16 0229     LOS: 1 day    Time spent: > 35 minutes  Velvet Bathe, MD [facility] Pager [phone]  If 7PM-7AM, please contact night-coverage [email or web address] Password [email or web address] 11/17/2016, 5:21 PM

## 2016-11-18 MED ORDER — ACETAMINOPHEN 500 MG PO TABS
1000.0000 mg | ORAL_TABLET | Freq: Four times a day (QID) | ORAL | Status: DC | PRN
  Administered 2016-11-18 – 2016-11-25 (×11): 1000 mg via ORAL
  Filled 2016-11-18 (×11): qty 2

## 2016-11-18 NOTE — Progress Notes (Signed)
PROGRESS NOTE    Jeff Rivera  [id number] DOB: 10-Feb-1941 DOA: 11/16/2016 PCP: Thressa Sheller, MD    Brief Narrative:   76 y.o. with known hx of HTN, CKD stage III, chronic GI bleed, recent dx of DVT back in December when he was initially placed on Xarelto but subsequently developed GI bleed, just discharged 2/3 (was hospitalized from 2/2 - 2/3 for IVC filter placement), now presented from home by his son for evaluation of progressively worsening confusion, poor oral intake since being discharged  Opiods make patient confused (tried norco and percocet). Currently on tramadol and high dose tylenol. NSAIDs contraindicated in patient with DVT and history of GI bleeds  Assessment & Plan:    Acute deep vein thrombosis (DVT) of femoral vein of right lower extremity ([facility]) - IVC filter placed secondary to GI bleed. xarelto held - Pain control an issue, will d/c tramadol and place on norco    HCAP (healthcare-associated pneumonia) - Will continue antibiotic regimen.  Active Problems:   CAD (coronary artery disease) - continue statin - no chest pain reported    Dyslipidemia  - stable on statin.   DVT prophylaxis: Lovenox Code Status: Full Family Communication: d/c family at bedside. Disposition Plan: pending continued improvement in respiratory condition and improved pain control without the confusion (that came because of the opiods)   Consultants:   none   Procedures: none   Antimicrobials: vancomycin, zosyn  Subjective: Pt complaints of neck pain. Family reports some confusion,   Objective: Vitals:   11/18/16 0659 11/18/16 0700 11/18/16 1337 11/18/16 1431  BP:   124/75 126/76  Pulse:   97   Resp:   16   Temp: 99.2 F (37.3 C)  99.2 F (37.3 C) 99.5 F (37.5 C)  TempSrc: Oral  Oral Oral  SpO2:   97%   Weight:  79.6 kg (175 lb 8 oz)    Height:        Intake/Output Summary (Last 24 hours) at 11/18/16 1554 Last data filed at 11/18/16 1537  Gross per 24 hour   Intake             1750 ml  Output             1000 ml  Net              750 ml   Filed Weights   11/16/16 2014 11/18/16 0700  Weight: 79.4 kg (175 lb) 79.6 kg (175 lb 8 oz)    Examination:  General exam: Appears calm and comfortable, in nad. Respiratory system: Clear to auscultation. Respiratory effort normal. Equal chest rise. Decreased breath sounds of left base Cardiovascular system: S1 & S2 heard, RRR. No JVD, murmurs, rubs, gallops or clicks. No pedal edema. Gastrointestinal system: Abdomen is nondistended, soft and nontender. No organomegaly or masses felt. Normal bowel sounds heard. Central nervous system: Alert and Awake, No focal neurological deficits. Extremities: Symmetric 5 x 5 power. Skin: No rashes, lesions or ulcers on limited exam, on limited exam. Musculoskeletal: Neck pain with palpation Psychiatry:Mood & affect appropriate.   Data Reviewed: I have personally reviewed following labs and imaging studies  CBC:  Recent Labs Lab 11/14/16 0651 11/14/16 1416 11/14/16 2328 11/15/16 0826 11/16/16 1449 11/17/16 0027  WBC 6.9 6.3  --  7.1 14.2* 11.6*  HGB 7.8* 7.3* 8.3* 8.6* 9.9* 8.9*  HCT 24.0* 22.8* 25.1* 26.0* 29.7* 26.5*  MCV 95.2 95.4  --  93.9 91.7 92.0  PLT 268 248  --  219 297 [id number]   Basic Metabolic Panel:  Recent Labs Lab 11/14/16 1416 11/15/16 0826 11/16/16 1449 11/17/16 0027  NA  --  137 135 138  K  --  4.4 4.4 4.3  CL  --  108 103 107  CO2  --  26 23 26   GLUCOSE  --  97 105* 109*  BUN  --  19 19 20   CREATININE 1.72* 1.53* 1.49* 1.54*  CALCIUM  --  8.6* 9.7 9.2   GFR: Estimated Creatinine Clearance: 38.2 mL/min (by C-G formula based on SCr of 1.54 mg/dL (H)). Liver Function Tests: No results for input(s): AST, ALT, ALKPHOS, BILITOT, PROT, ALBUMIN in the last 168 hours. No results for input(s): LIPASE, AMYLASE in the last 168 hours. No results for input(s): AMMONIA in the last 168 hours. Coagulation Profile:  Recent Labs Lab  11/16/16 2150  INR 1.34   Cardiac Enzymes: No results for input(s): CKTOTAL, CKMB, CKMBINDEX, TROPONINI in the last 168 hours. BNP (last 3 results) No results for input(s): PROBNP in the last 8760 hours. HbA1C: No results for input(s): HGBA1C in the last 72 hours. CBG: No results for input(s): GLUCAP in the last 168 hours. Lipid Profile: No results for input(s): CHOL, HDL, LDLCALC, TRIG, CHOLHDL, LDLDIRECT in the last 72 hours. Thyroid Function Tests: No results for input(s): TSH, T4TOTAL, FREET4, T3FREE, THYROIDAB in the last 72 hours. Anemia Panel: No results for input(s): VITAMINB12, FOLATE, FERRITIN, TIBC, IRON, RETICCTPCT in the last 72 hours. Sepsis Labs:  Recent Labs Lab 11/16/16 1719 11/16/16 2150 11/17/16 0027  PROCALCITON  --  0.25  --   LATICACIDVEN 1.21 0.9 0.8    Recent Results (from the past 240 hour(s))  Blood culture (routine x 2)     Status: None (Preliminary result)   Collection Time: 11/16/16  4:56 PM  Result Value Ref Range Status   Specimen Description BLOOD RIGHT WRIST  Final   Special Requests BOTTLES DRAWN AEROBIC AND ANAEROBIC 5CC  Final   Culture NO GROWTH 2 DAYS  Final   Report Status PENDING  Incomplete  Blood culture (routine x 2)     Status: None (Preliminary result)   Collection Time: 11/16/16  5:00 PM  Result Value Ref Range Status   Specimen Description BLOOD LEFT WRIST  Final   Special Requests AEROBIC BOTTLE ONLY 10CC  Final   Culture NO GROWTH 2 DAYS  Final   Report Status PENDING  Incomplete  Respiratory Panel by PCR     Status: None   Collection Time: 11/16/16  7:47 PM  Result Value Ref Range Status   Adenovirus NOT DETECTED NOT DETECTED Final   Coronavirus 229E NOT DETECTED NOT DETECTED Final   Coronavirus HKU1 NOT DETECTED NOT DETECTED Final   Coronavirus NL63 NOT DETECTED NOT DETECTED Final   Coronavirus OC43 NOT DETECTED NOT DETECTED Final   Metapneumovirus NOT DETECTED NOT DETECTED Final   Rhinovirus / Enterovirus NOT  DETECTED NOT DETECTED Final   Influenza A NOT DETECTED NOT DETECTED Final   Influenza B NOT DETECTED NOT DETECTED Final   Parainfluenza Virus 1 NOT DETECTED NOT DETECTED Final   Parainfluenza Virus 2 NOT DETECTED NOT DETECTED Final   Parainfluenza Virus 3 NOT DETECTED NOT DETECTED Final   Parainfluenza Virus 4 NOT DETECTED NOT DETECTED Final   Respiratory Syncytial Virus NOT DETECTED NOT DETECTED Final   Bordetella pertussis NOT DETECTED NOT DETECTED Final   Chlamydophila pneumoniae NOT DETECTED NOT DETECTED Final   Mycoplasma pneumoniae NOT DETECTED NOT  DETECTED Final         Radiology Studies: Ct Head Wo Contrast  Result Date: 11/16/2016 CLINICAL DATA:  Altered mental status.  Confusion. EXAM: CT HEAD WITHOUT CONTRAST TECHNIQUE: Contiguous axial images were obtained from the base of the skull through the vertex without intravenous contrast. COMPARISON:  Brain MRI 12/14/2012 FINDINGS: Brain: No evidence of acute infarction, hemorrhage, extra-axial collection or mass lesion/mass effect. 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 Microvascular ischemic changes mild. Vascular: Atherosclerotic calcification. Skull: No acute or aggressive finding Sinuses/Orbits: Bilateral cataract resection IMPRESSION: 1. No acute finding. 2. 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 Electronically Signed   By: Monte Fantasia M.D.   On: 11/16/2016 19:00   Dg Chest Port 1 View  Result Date: 11/16/2016 CLINICAL DATA:  Possible fever; Complications from anesthesia; Had IVC filter placed for DVTs in right leg 3 days ago; Right leg is now swollen, firm and tender; the patient has extreme back and right hip pain; Hx of HTN, CKD, CAD, aortic stenosis EXAM: PORTABLE CHEST 1 VIEW COMPARISON:  07/24/2016 FINDINGS: Shallow lung inflation. Heart size is normal. Patchy infiltrate is identified in the left lung base. There is subsegmental atelectasis in the right lower lobe. No  pulmonary edema. IMPRESSION: Left lower lobe infiltrate.  Right lower lobe atelectasis. Electronically Signed   By: Nolon Nations M.D.   On: 11/16/2016 17:03        Scheduled Meds: . enoxaparin (LOVENOX) injection  40 mg Subcutaneous Q24H  . guaiFENesin  600 mg Oral BID  . mirabegron ER  50 mg Oral Daily  . multivitamin with minerals  1 tablet Oral Daily  . pantoprazole  40 mg Oral Daily  . piperacillin-tazobactam (ZOSYN)  IV  3.375 g Intravenous Q8H  . simvastatin  20 mg Oral q1800  . sodium chloride flush  3 mL Intravenous Q12H  . vancomycin  750 mg Intravenous Q12H   Continuous Infusions: . sodium chloride 75 mL/hr at 11/18/16 1145     LOS: 2 days    Time spent: > 35 minutes  Velvet Bathe, MD [facility] Pager [phone]  If 7PM-7AM, please contact night-coverage [email or web address] Password [email or web address] 11/18/2016, 3:54 PM

## 2016-11-18 NOTE — Care Management Note (Addendum)
Case Management Note  Patient Details  Name: ADAHIR YAROSH MRN: [id number] Date of Birth: Jun 27, 1941  Subjective/Objective:                 Patient admitted from home with wife for HCAP after release over the weekend. IV Abx. Cm will continue to follow. May be candidate for EMMI PNA through [facility].  2/7 Spoke with daughter in law [place] at the bedside. Agreeable to [facility], referral placed to [facility]. Patent and family provided with [facility] list for [facility]. PT consult pending. Patient was active with [facility] per DIL, but they do not want to resume services with them after DC. Upon going back and providing list for [place] stated that due to patient's decline (was ambulatory Friday and cannot get to bathroom without assist of 2 now) she would prefer SNF at DC. They are interested in [place] if possible.    Action/Plan:  CM will continue to follow for [facility] needs and referral, if needed. CSW updated.   Expected Discharge Date:                  Expected Discharge Plan:     In-House Referral:     Discharge planning Services  CM Consult  Post Acute Care Choice:    Choice offered to:     DME Arranged:    DME Agency:     HH Arranged:    HH Agency:     Status of Service:  In process, will continue to follow  If discussed at Long Length of Stay Meetings, dates discussed:    Additional Comments:  Carles Collet, RN 11/18/2016, 3:02 PM

## 2016-11-18 NOTE — Progress Notes (Signed)
Notified Baltazar Najjar, NP that pt's temp is 101.3 this morning. Baltazar Najjar, NP ordered tylenol. Will continue to monitor pt. Ranelle Oyster, RN

## 2016-11-19 DIAGNOSIS — R627 Adult failure to thrive: Secondary | ICD-10-CM

## 2016-11-19 DIAGNOSIS — J189 Pneumonia, unspecified organism: Secondary | ICD-10-CM

## 2016-11-19 DIAGNOSIS — I82411 Acute embolism and thrombosis of right femoral vein: Secondary | ICD-10-CM

## 2016-11-19 LAB — CBC
HEMATOCRIT: 24.7 % — AB (ref 39.0–52.0)
HEMOGLOBIN: 8.3 g/dL — AB (ref 13.0–17.0)
MCH: 31 pg (ref 26.0–34.0)
MCHC: 33.6 g/dL (ref 30.0–36.0)
MCV: 92.2 fL (ref 78.0–100.0)
Platelets: 262 10*3/uL (ref 150–400)
RBC: 2.68 MIL/uL — ABNORMAL LOW (ref 4.22–5.81)
RDW: 15 % (ref 11.5–15.5)
WBC: 10.8 10*3/uL — AB (ref 4.0–10.5)

## 2016-11-19 LAB — BASIC METABOLIC PANEL
ANION GAP: 7 (ref 5–15)
BUN: 18 mg/dL (ref 6–20)
CHLORIDE: 105 mmol/L (ref 101–111)
CO2: 25 mmol/L (ref 22–32)
Calcium: 8.8 mg/dL — ABNORMAL LOW (ref 8.9–10.3)
Creatinine, Ser: 1.6 mg/dL — ABNORMAL HIGH (ref 0.61–1.24)
GFR calc non Af Amer: 40 mL/min — ABNORMAL LOW (ref >60)
GFR, EST AFRICAN AMERICAN: 46 mL/min — AB (ref >60)
Glucose, Bld: 118 mg/dL — ABNORMAL HIGH (ref 65–99)
Potassium: 3.9 mmol/L (ref 3.5–5.1)
Sodium: 137 mmol/L (ref 135–145)

## 2016-11-19 LAB — VANCOMYCIN, TROUGH: Vancomycin Tr: 15 ug/mL (ref 15–20)

## 2016-11-19 NOTE — Progress Notes (Signed)
Pharmacy Antibiotic Note  Jeff Rivera is a 76 y.o. male admitted on 11/16/2016 with hip pain. Pharmacy has been consulted for Vancomycin + Zosyn for r/o PNA coverage. CrCl~40-50 ml/min.   Vancomycin trough drawn appropriately and is within therapeutic range at 15. SCr stable, WBC trending down, Tmax 101.  Plan: 1. Continue Vancomycin 750 mg IV every 12 hours 2. Continue Zosyn 3.375g IV every 8 hours 3. Will continue to follow renal function, culture results, LOT, and antibiotic de-escalation plans  Height: 5' 7.5" (171.5 cm) Weight: 180 lb 8 oz (81.9 kg) IBW/kg (Calculated) : 67.25  Temp (24hrs), Avg:99.7 F (37.6 C), Min:99.1 F (37.3 C), Max:101 F (38.3 C)   Recent Labs Lab 11/14/16 1416 11/15/16 0826 11/16/16 1449 11/16/16 1719 11/16/16 2150 11/17/16 0027 11/19/16 0018 11/19/16 0728  WBC 6.3 7.1 14.2*  --   --  11.6* 10.8*  --   CREATININE 1.72* 1.53* 1.49*  --   --  1.54* 1.60*  --   LATICACIDVEN  --   --   --  1.21 0.9 0.8  --   --   VANCOTROUGH  --   --   --   --   --   --   --  15    Estimated Creatinine Clearance: 40 mL/min (by C-G formula based on SCr of 1.6 mg/dL (H)).    No Known Allergies  Antimicrobials this admission: Vanc 2/4 >> Zosyn 2/4 >>  Dose adjustments this admission: N/a  Microbiology results: 2/4 BCx >> ngtd  Thank you for allowing [place] to participate in this patients care.  Jens Som, PharmD Clinical phone for 11/19/2016 from 7a-3:30p: [phone] If after 3:30p, please call main pharmacy at: [phone] 11/19/2016 9:00 AM

## 2016-11-19 NOTE — NC FL2 (Signed)
[place] LEVEL OF CARE SCREENING TOOL     IDENTIFICATION  Patient Name: Jeff Rivera Birthdate: 1941/04/19 Sex: male Admission Date (Current Location): 11/16/2016  [facility] and [place] Number:  Herbalist and Address:  The . [facility], [place] [address], [place], [place] [address]      Provider Number: [id number]  Attending Physician Name and Address:  Florencia Reasons, MD  Relative Name and Phone Number:  Judson Roch, spouse, [phone]    Current Level of Care: Hospital Recommended Level of Care: [place] Prior Approval Number:    Date Approved/Denied:   PASRR Number: [id number]  Discharge Plan: SNF    Current Diagnoses: Patient Active Problem List   Diagnosis Date Noted  . HCAP (healthcare-associated pneumonia) 11/16/2016  . Generalized weakness   . Acute deep vein thrombosis (DVT) of femoral vein of right lower extremity ([place]) 11/14/2016  . Acute blood loss anemia 11/14/2016  . DVT (deep venous thrombosis) ([place]) 11/13/2016  . Anemia 07/24/2016  . 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 CAD (coronary artery disease) 09/12/2013  . Aortic stenosis 09/12/2013  . Dyslipidemia 09/12/2013  . HTN (hypertension) 09/12/2013    Orientation RESPIRATION BLADDER Height & Weight     Self, Place  Normal Incontinent, External catheter Weight: 81.9 kg (180 lb 8 oz) Height:  5' 7.5" (171.5 cm)  BEHAVIORAL SYMPTOMS/MOOD NEUROLOGICAL BOWEL NUTRITION STATUS      Continent Diet (Please see DC Summary)  AMBULATORY STATUS COMMUNICATION OF NEEDS Skin   Extensive Assist Verbally Normal                       Personal Care Assistance Level of Assistance  Bathing, Feeding, Dressing Bathing Assistance: Maximum assistance Feeding assistance: Independent Dressing Assistance: Limited assistance     Functional Limitations Info             SPECIAL CARE FACTORS FREQUENCY  PT (By licensed PT)     PT Frequency: not yet  assessed              Contractures      Additional Factors Info  Code Status, Allergies Code Status Info: Full Allergies Info: NKA           Current Medications (11/19/2016):  This is the current hospital active medication list Current Facility-Administered Medications  Medication Dose Route Frequency Provider Last Rate Last Dose  . 0.9 %  sodium chloride infusion   Intravenous Continuous Duffy Bruce, MD 75 mL/hr at 11/19/16 0236    . acetaminophen (TYLENOL) tablet 1,000 mg  1,000 mg Oral Q6H PRN Gardiner Barefoot, NP   1,000 mg at 11/19/16 1402  . enoxaparin (LOVENOX) injection 40 mg  40 mg Subcutaneous Q24H Theodis Blaze, MD   40 mg at 11/19/16 0920  . guaiFENesin (MUCINEX) 12 hr tablet 600 mg  600 mg Oral BID Theodis Blaze, MD   600 mg at 11/19/16 0916  . levalbuterol (XOPENEX) nebulizer solution 0.63 mg  0.63 mg Nebulization Q6H PRN Theodis Blaze, MD      . mirabegron ER [facility]) tablet 50 mg  50 mg Oral Daily Theodis Blaze, MD   50 mg at 11/19/16 [id number]  . multivitamin with minerals tablet 1 tablet  1 tablet Oral Daily Theodis Blaze, MD   1 tablet at 11/18/16 0944  . ondansetron (ZOFRAN) tablet 4 mg  4 mg Oral Q6H PRN Theodis Blaze, MD  Or  . ondansetron (ZOFRAN) injection 4 mg  4 mg Intravenous Q6H PRN Theodis Blaze, MD      . pantoprazole (PROTONIX) EC tablet 40 mg  40 mg Oral Daily Velvet Bathe, MD   40 mg at 11/19/16 0916  . piperacillin-tazobactam (ZOSYN) IVPB 3.375 g  3.375 g Intravenous Q8H Rolla Flatten, RPH   3.375 g at 11/19/16 1140  . simvastatin (ZOCOR) tablet 20 mg  20 mg Oral q1800 Theodis Blaze, MD   20 mg at 11/18/16 1827  . sodium chloride flush (NS) 0.9 % injection 3 mL  3 mL Intravenous Q12H Theodis Blaze, MD   3 mL at 11/17/16 0834  . traMADol (ULTRAM) tablet 100 mg  100 mg Oral Q6H PRN Velvet Bathe, MD   100 mg at 11/18/16 1145  . vancomycin (VANCOCIN) IVPB 750 mg/150 ml premix  750 mg Intravenous Q12H Rolla Flatten, RPH   750 mg at  11/19/16 [id number]     Discharge Medications: Please see discharge summary for a list of discharge medications.  Relevant Imaging Results:  Relevant Lab Results:   Additional Information [id number]  Benard Halsted, LCSWA

## 2016-11-19 NOTE — Progress Notes (Signed)
PROGRESS NOTE    Jeff Rivera  [id number] DOB: 09/29/1941 DOA: 11/16/2016 PCP: Thressa Sheller, MD    Brief Narrative:   76 y.o. with known hx of HTN, CKD stage III, chronic GI bleed, recent dx of DVT back in December when he was initially placed on Xarelto but subsequently developed GI bleed, just discharged 2/3 (was hospitalized from 2/2 - 2/3 for IVC filter placement), now presented from home by his son for evaluation of progressively worsening confusion, poor oral intake since being discharged   Assessment & Plan:    Acute deep vein thrombosis (DVT) of femoral vein of right lower extremity ([place]) - IVC filter placed secondary to GI bleed. xarelto held - Pain control an issue, will d/c tramadol and place on norco    HCAP (healthcare-associated pneumonia) Respiratory viral penal negative, sputum culture not collected Cxr left lower lobe infiltrate - Will continue antibiotic regimen.  Active Problems:   CAD (coronary artery disease) - continue statin - no chest pain reported    Dyslipidemia  - stable on statin.  FTT: PT/OT/SNF   DVT prophylaxis: Lovenox Code Status: Full Family Communication: pateint Disposition Plan: likely need SNF   Consultants:   none   Procedures: none   Antimicrobials: vancomycin, zosyn   Subjective: drowsy  Objective: Vitals:   11/19/16 0601 11/19/16 0650 11/19/16 0722 11/19/16 1417  BP: (!) 156/73   137/60  Pulse: (!) 104   (!) 102  Resp: 20   18  Temp: (!) 101 F (38.3 C)  99.8 F (37.7 C) 98.1 F (36.7 C)  TempSrc: Oral  Oral Oral  SpO2: 93%   95%  Weight:  81.9 kg (180 lb 8 oz)    Height:        Intake/Output Summary (Last 24 hours) at 11/19/16 2104 Last data filed at 11/19/16 1500  Gross per 24 hour  Intake             2350 ml  Output              875 ml  Net             1475 ml   Filed Weights   11/16/16 2014 11/18/16 0700 11/19/16 0650  Weight: 79.4 kg (175 lb) 79.6 kg (175 lb 8 oz) 81.9 kg (180 lb 8 oz)      Examination:  General exam: drowy Respiratory system: Clear to auscultation. Respiratory effort normal. Equal chest rise. Decreased breath sounds of left base Cardiovascular system: S1 & S2 heard, RRR. No JVD, murmurs, rubs, gallops or clicks. No pedal edema. Gastrointestinal system: Abdomen is nondistended, soft and nontender. No organomegaly or masses felt. Normal bowel sounds heard. Central nervous system: Alert and Awake, No focal neurological deficits. Extremities: Symmetric 5 x 5 power. Skin: No rashes, lesions or ulcers on limited exam, on limited exam. Musculoskeletal: Neck pain with palpation Psychiatry:Mood & affect appropriate.     Data Reviewed: I have personally reviewed following labs and imaging studies  CBC:  Recent Labs Lab 11/14/16 1416 11/14/16 2328 11/15/16 0826 11/16/16 1449 11/17/16 0027 11/19/16 0018  WBC 6.3  --  7.1 14.2* 11.6* 10.8*  HGB 7.3* 8.3* 8.6* 9.9* 8.9* 8.3*  HCT 22.8* 25.1* 26.0* 29.7* 26.5* 24.7*  MCV 95.4  --  93.9 91.7 92.0 92.2  PLT 248  --  219 297 247 [id number]   Basic Metabolic Panel:  Recent Labs Lab 11/14/16 1416 11/15/16 0826 11/16/16 1449 11/17/16 0027 11/19/16 0018  NA  --  137 135 138 137  K  --  4.4 4.4 4.3 3.9  CL  --  108 103 107 105  CO2  --  26 23 26 25   GLUCOSE  --  97 105* 109* 118*  BUN  --  19 19 20 18   CREATININE 1.72* 1.53* 1.49* 1.54* 1.60*  CALCIUM  --  8.6* 9.7 9.2 8.8*   GFR: Estimated Creatinine Clearance: 40 mL/min (by C-G formula based on SCr of 1.6 mg/dL (H)). Liver Function Tests: No results for input(s): AST, ALT, ALKPHOS, BILITOT, PROT, ALBUMIN in the last 168 hours. No results for input(s): LIPASE, AMYLASE in the last 168 hours. No results for input(s): AMMONIA in the last 168 hours. Coagulation Profile:  Recent Labs Lab 11/16/16 2150  INR 1.34   Cardiac Enzymes: No results for input(s): CKTOTAL, CKMB, CKMBINDEX, TROPONINI in the last 168 hours. BNP (last 3 results) No results for  input(s): PROBNP in the last 8760 hours. HbA1C: No results for input(s): HGBA1C in the last 72 hours. CBG: No results for input(s): GLUCAP in the last 168 hours. Lipid Profile: No results for input(s): CHOL, HDL, LDLCALC, TRIG, CHOLHDL, LDLDIRECT in the last 72 hours. Thyroid Function Tests: No results for input(s): TSH, T4TOTAL, FREET4, T3FREE, THYROIDAB in the last 72 hours. Anemia Panel: No results for input(s): VITAMINB12, FOLATE, FERRITIN, TIBC, IRON, RETICCTPCT in the last 72 hours. Sepsis Labs:  Recent Labs Lab 11/16/16 1719 11/16/16 2150 11/17/16 0027  PROCALCITON  --  0.25  --   LATICACIDVEN 1.21 0.9 0.8    Recent Results (from the past 240 hour(s))  Blood culture (routine x 2)     Status: None (Preliminary result)   Collection Time: 11/16/16  4:56 PM  Result Value Ref Range Status   Specimen Description BLOOD RIGHT WRIST  Final   Special Requests BOTTLES DRAWN AEROBIC AND ANAEROBIC 5CC  Final   Culture NO GROWTH 3 DAYS  Final   Report Status PENDING  Incomplete  Blood culture (routine x 2)     Status: None (Preliminary result)   Collection Time: 11/16/16  5:00 PM  Result Value Ref Range Status   Specimen Description BLOOD LEFT WRIST  Final   Special Requests AEROBIC BOTTLE ONLY 10CC  Final   Culture NO GROWTH 3 DAYS  Final   Report Status PENDING  Incomplete  Respiratory Panel by PCR     Status: None   Collection Time: 11/16/16  7:47 PM  Result Value Ref Range Status   Adenovirus NOT DETECTED NOT DETECTED Final   Coronavirus 229E NOT DETECTED NOT DETECTED Final   Coronavirus HKU1 NOT DETECTED NOT DETECTED Final   Coronavirus NL63 NOT DETECTED NOT DETECTED Final   Coronavirus OC43 NOT DETECTED NOT DETECTED Final   Metapneumovirus NOT DETECTED NOT DETECTED Final   Rhinovirus / Enterovirus NOT DETECTED NOT DETECTED Final   Influenza A NOT DETECTED NOT DETECTED Final   Influenza B NOT DETECTED NOT DETECTED Final   Parainfluenza Virus 1 NOT DETECTED NOT DETECTED  Final   Parainfluenza Virus 2 NOT DETECTED NOT DETECTED Final   Parainfluenza Virus 3 NOT DETECTED NOT DETECTED Final   Parainfluenza Virus 4 NOT DETECTED NOT DETECTED Final   Respiratory Syncytial Virus NOT DETECTED NOT DETECTED Final   Bordetella pertussis NOT DETECTED NOT DETECTED Final   Chlamydophila pneumoniae NOT DETECTED NOT DETECTED Final   Mycoplasma pneumoniae NOT DETECTED NOT DETECTED Final         Radiology Studies: No results found.  Scheduled Meds: . enoxaparin (LOVENOX) injection  40 mg Subcutaneous Q24H  . guaiFENesin  600 mg Oral BID  . mirabegron ER  50 mg Oral Daily  . multivitamin with minerals  1 tablet Oral Daily  . pantoprazole  40 mg Oral Daily  . piperacillin-tazobactam (ZOSYN)  IV  3.375 g Intravenous Q8H  . simvastatin  20 mg Oral q1800  . sodium chloride flush  3 mL Intravenous Q12H  . vancomycin  750 mg Intravenous Q12H   Continuous Infusions: . sodium chloride 75 mL/hr at 11/19/16 1638     LOS: 3 days    Time spent: > 35 minutes  Draden Cottingham, MD PhD [facility] Pager [phone]  If 7PM-7AM, please contact night-coverage [email or web address] Password [email or web address] 11/19/2016, 9:04 PM

## 2016-11-20 ENCOUNTER — Inpatient Hospital Stay (HOSPITAL_COMMUNITY): Payer: Medicare Other

## 2016-11-20 DIAGNOSIS — R7989 Other specified abnormal findings of blood chemistry: Secondary | ICD-10-CM

## 2016-11-20 DIAGNOSIS — G9341 Metabolic encephalopathy: Secondary | ICD-10-CM

## 2016-11-20 LAB — COMPREHENSIVE METABOLIC PANEL
ALBUMIN: 2.2 g/dL — AB (ref 3.5–5.0)
ALT: 65 U/L — AB (ref 17–63)
AST: 61 U/L — AB (ref 15–41)
Alkaline Phosphatase: 46 U/L (ref 38–126)
Anion gap: 10 (ref 5–15)
BUN: 16 mg/dL (ref 6–20)
CHLORIDE: 107 mmol/L (ref 101–111)
CO2: 24 mmol/L (ref 22–32)
CREATININE: 1.39 mg/dL — AB (ref 0.61–1.24)
Calcium: 9.1 mg/dL (ref 8.9–10.3)
GFR calc Af Amer: 55 mL/min — ABNORMAL LOW (ref >60)
GFR calc non Af Amer: 47 mL/min — ABNORMAL LOW (ref >60)
GLUCOSE: 111 mg/dL — AB (ref 65–99)
Potassium: 3.7 mmol/L (ref 3.5–5.1)
SODIUM: 141 mmol/L (ref 135–145)
Total Bilirubin: 0.6 mg/dL (ref 0.3–1.2)
Total Protein: 5.5 g/dL — ABNORMAL LOW (ref 6.5–8.1)

## 2016-11-20 LAB — CBC WITH DIFFERENTIAL/PLATELET
BASOS ABS: 0 10*3/uL (ref 0.0–0.1)
Basophils Relative: 1 %
EOS ABS: 0.4 10*3/uL (ref 0.0–0.7)
Eosinophils Relative: 4 %
HCT: 26.1 % — ABNORMAL LOW (ref 39.0–52.0)
Hemoglobin: 8.6 g/dL — ABNORMAL LOW (ref 13.0–17.0)
Lymphocytes Relative: 10 %
Lymphs Abs: 0.9 10*3/uL (ref 0.7–4.0)
MCH: 30.5 pg (ref 26.0–34.0)
MCHC: 33 g/dL (ref 30.0–36.0)
MCV: 92.6 fL (ref 78.0–100.0)
Monocytes Absolute: 0.8 10*3/uL (ref 0.1–1.0)
Monocytes Relative: 10 %
Neutro Abs: 6.1 10*3/uL (ref 1.7–7.7)
Neutrophils Relative %: 75 %
PLATELETS: 297 10*3/uL (ref 150–400)
RBC: 2.82 MIL/uL — AB (ref 4.22–5.81)
RDW: 15.1 % (ref 11.5–15.5)
WBC: 8.2 10*3/uL (ref 4.0–10.5)

## 2016-11-20 LAB — RETICULOCYTES
RBC.: 2.98 MIL/uL — ABNORMAL LOW (ref 4.22–5.81)
RETIC COUNT ABSOLUTE: 56.6 10*3/uL (ref 19.0–186.0)
RETIC CT PCT: 1.9 % (ref 0.4–3.1)

## 2016-11-20 LAB — OCCULT BLOOD X 1 CARD TO LAB, STOOL: Fecal Occult Bld: POSITIVE — AB

## 2016-11-20 LAB — MRSA PCR SCREENING: MRSA by PCR: NEGATIVE

## 2016-11-20 MED ORDER — DOXYCYCLINE HYCLATE 100 MG PO TABS
100.0000 mg | ORAL_TABLET | Freq: Two times a day (BID) | ORAL | Status: DC
  Administered 2016-11-20 – 2016-11-22 (×5): 100 mg via ORAL
  Filled 2016-11-20 (×5): qty 1

## 2016-11-20 MED ORDER — FLUTICASONE PROPIONATE 50 MCG/ACT NA SUSP
2.0000 | Freq: Every day | NASAL | Status: DC
  Administered 2016-11-20 – 2016-11-25 (×5): 2 via NASAL
  Filled 2016-11-20: qty 16

## 2016-11-20 MED ORDER — QUETIAPINE FUMARATE 25 MG PO TABS
25.0000 mg | ORAL_TABLET | Freq: Two times a day (BID) | ORAL | Status: DC
  Administered 2016-11-20 – 2016-11-25 (×11): 25 mg via ORAL
  Filled 2016-11-20 (×11): qty 1

## 2016-11-20 NOTE — Progress Notes (Signed)
PROGRESS NOTE    Jeff Rivera  [id number] DOB: Mar 02, 1941 DOA: 11/16/2016 PCP: Thressa Sheller, MD    Brief Narrative:   76 y.o. with known hx of HTN, CKD stage III, chronic GI bleed, recent dx of DVT back in December when he was initially placed on Xarelto but subsequently developed GI bleed, just discharged 2/3 (was hospitalized from 2/2 - 2/3 for IVC filter placement), now presented from home by his son for evaluation of progressively worsening confusion, poor oral intake since being discharged   Assessment & Plan:   Metabolic encephlophaty/progressive confusion (presenting symptom) Likely multifactorial, from pneumonia Ct head showed normal pressure hydrocephalus, no acute findings. Improving, but per family not back to baseline. Check rpr, hiv, b12, folate, tsh, ammonia, need outpatient follow up with neurology for normal presure hydrocephalus  HCAP (healthcare-associated pneumonia)/sepsis presented on admission Cxr left lower lobe infiltrate, he presented with confusion, leukocytosis wbc14.2 Respiratory viral penal negative, sputum culture not collected, blood culture no growth, mrsa screening negative, urine strep pneumonia antigen negative,  -he is treated with vanc/zosyn since admission, with clinical improvement, wbc has normalized, less confusion, change abx to oral doxycycline  lft elevation, with normal bili and alk phos  from sepsis? Will check ck, hepatitis panel, liver US   CKD III:  cr 1.3-1.6 close to baseline ua no infection Renal dosing meds  Acute deep vein thrombosis (DVT) of femoral vein of right lower extremity ([place]) , he was hospitalized for this from  2/2 to 2/3 - IVC filter placed secondary to GI bleed. xarelto held - Pain control an issue, family report patient is not able to tolerate any narcotic ( all cause confusion), except ultram, he is on prn ultram    Chronic Anemia: normocytic With h/o gi bleed, off anticoagulation, off asa Anemia work  up    CAD (coronary artery disease) - continue statin, asa discontinued from recent hospitalization due to gi bleed - no chest pain reported    Dyslipidemia  - stable on statin.  FTT: PT/OT/SNF   DVT prophylaxis: Lovenox Code Status: Full Family Communication: patient , his wife and daughter at bedside Disposition Plan:  SNF in 1-2 days   Consultants:   none   Procedures: none   Antimicrobials: vancomycin, zosyn from admission to 2/8  Doxycycline from 2/8 to   Subjective: Alert, carrying conversation, pleasant, not oriented to place, think he is at rehab facility Daughter and wife in room  Objective: Vitals:   11/19/16 0722 11/19/16 1417 11/19/16 2155 11/20/16 0559  BP:  137/60 140/62 (!) 160/77  Pulse:  (!) 102 91 98  Resp:  _0 Temp: 99.8 F (37.7 C) 98.1 F (36.7 C) 99.2 F (37.3 C) 98.7 F (37.1 C)  TempSrc: Oral Oral Oral Oral  SpO2:  95% 98% 98%  Weight:    82.2 kg (181 lb 3.2 oz)  Height:        Intake/Output Summary (Last 24 hours) at 11/20/16 1231 Last data filed at 11/20/16 0600  Gross per 24 hour  Intake          1656.25 ml  Output                0 ml  Net          1656.25 ml   Filed Weights   11/18/16 0700 11/19/16 0650 11/20/16 0559  Weight: 79.6 kg (175 lb 8 oz) 81.9 kg (180 lb 8 oz) 82.2 kg (181 lb 3.2 oz)  Examination:  General exam: alert, not oriented to place Respiratory system: Clear to auscultation. Respiratory effort normal. Equal chest rise. Decreased breath sounds of left base Cardiovascular system: S1 & S2 heard, RRR. No JVD, murmurs, rubs, gallops or clicks. No pedal edema. Gastrointestinal system: Abdomen is nondistended, soft and nontender. No organomegaly or masses felt. Normal bowel sounds heard. Central nervous system: Alert and Awake, No focal neurological deficits. Extremities: Symmetric 5 x 5 power. Edema right >left lower extremity Skin: No rashes, lesions or ulcers on limited exam, on limited  exam. Musculoskeletal: Neck pain with palpation Psychiatry:Mood & affect appropriate.     Data Reviewed: I have personally reviewed following labs and imaging studies  CBC:  Recent Labs Lab 11/15/16 0826 11/16/16 1449 11/17/16 0027 11/19/16 0018 11/20/16 0436  WBC 7.1 14.2* 11.6* 10.8* 8.2  NEUTROABS  --   --   --   --  6.1  HGB 8.6* 9.9* 8.9* 8.3* 8.6*  HCT 26.0* 29.7* 26.5* 24.7* 26.1*  MCV 93.9 91.7 92.0 92.2 92.6  PLT 219 297 247 262 295   Basic Metabolic Panel:  Recent Labs Lab 11/15/16 0826 11/16/16 1449 11/17/16 0027 11/19/16 0018 11/20/16 0436  NA 137 135 138 137 141  K 4.4 4.4 4.3 3.9 3.7  CL 108 103 107 105 107  CO2 _0 GLUCOSE 97 105* 109* 118* 111*  BUN _1 CREATININE 1.53* 1.49* 1.54* 1.60* 1.39*  CALCIUM 8.6* 9.7 9.2 8.8* 9.1   GFR: Estimated Creatinine Clearance: 46.1 mL/min (by C-G formula based on SCr of 1.39 mg/dL (H)). Liver Function Tests:  Recent Labs Lab 11/20/16 0436  AST 61*  ALT 65*  ALKPHOS 46  BILITOT 0.6  PROT 5.5*  ALBUMIN 2.2*   No results for input(s): LIPASE, AMYLASE in the last 168 hours. No results for input(s): AMMONIA in the last 168 hours. Coagulation Profile:  Recent Labs Lab 11/16/16 2150  INR 1.34   Cardiac Enzymes: No results for input(s): CKTOTAL, CKMB, CKMBINDEX, TROPONINI in the last 168 hours. BNP (last 3 results) No results for input(s): PROBNP in the last 8760 hours. HbA1C: No results for input(s): HGBA1C in the last 72 hours. CBG: No results for input(s): GLUCAP in the last 168 hours. Lipid Profile: No results for input(s): CHOL, HDL, LDLCALC, TRIG, CHOLHDL, LDLDIRECT in the last 72 hours. Thyroid Function Tests: No results for input(s): TSH, T4TOTAL, FREET4, T3FREE, THYROIDAB in the last 72 hours. Anemia Panel: No results for input(s): VITAMINB12, FOLATE, FERRITIN, TIBC, IRON, RETICCTPCT in the last 72 hours. Sepsis Labs:  Recent Labs Lab 11/16/16 1719  11/16/16 2150 11/17/16 0027  PROCALCITON  --  0.25  --   LATICACIDVEN 1.21 0.9 0.8    Recent Results (from the past 240 hour(s))  Blood culture (routine x 2)     Status: None (Preliminary result)   Collection Time: 11/16/16  4:56 PM  Result Value Ref Range Status   Specimen Description BLOOD RIGHT WRIST  Final   Special Requests BOTTLES DRAWN AEROBIC AND ANAEROBIC 5CC  Final   Culture NO GROWTH 4 DAYS  Final   Report Status PENDING  Incomplete  Blood culture (routine x 2)     Status: None (Preliminary result)   Collection Time: 11/16/16  5:00 PM  Result Value Ref Range Status   Specimen Description BLOOD LEFT WRIST  Final   Special Requests AEROBIC BOTTLE ONLY 10CC  Final   Culture NO GROWTH 4 DAYS  Final  Report Status PENDING  Incomplete  Respiratory Panel by PCR     Status: None   Collection Time: 11/16/16  7:47 PM  Result Value Ref Range Status   Adenovirus NOT DETECTED NOT DETECTED Final   Coronavirus 229E NOT DETECTED NOT DETECTED Final   Coronavirus HKU1 NOT DETECTED NOT DETECTED Final   Coronavirus NL63 NOT DETECTED NOT DETECTED Final   Coronavirus OC43 NOT DETECTED NOT DETECTED Final   Metapneumovirus NOT DETECTED NOT DETECTED Final   Rhinovirus / Enterovirus NOT DETECTED NOT DETECTED Final   Influenza A NOT DETECTED NOT DETECTED Final   Influenza B NOT DETECTED NOT DETECTED Final   Parainfluenza Virus 1 NOT DETECTED NOT DETECTED Final   Parainfluenza Virus 2 NOT DETECTED NOT DETECTED Final   Parainfluenza Virus 3 NOT DETECTED NOT DETECTED Final   Parainfluenza Virus 4 NOT DETECTED NOT DETECTED Final   Respiratory Syncytial Virus NOT DETECTED NOT DETECTED Final   Bordetella pertussis NOT DETECTED NOT DETECTED Final   Chlamydophila pneumoniae NOT DETECTED NOT DETECTED Final   Mycoplasma pneumoniae NOT DETECTED NOT DETECTED Final  MRSA PCR Screening     Status: None   Collection Time: 11/19/16  9:04 PM  Result Value Ref Range Status   MRSA by PCR NEGATIVE NEGATIVE  Final    Comment:        The GeneXpert MRSA Assay (FDA approved for NASAL specimens only), is one component of a comprehensive MRSA colonization surveillance program. It is not intended to diagnose MRSA infection nor to guide or monitor treatment for MRSA infections.          Radiology Studies: No results found.      Scheduled Meds: . doxycycline  100 mg Oral Q12H  . enoxaparin (LOVENOX) injection  40 mg Subcutaneous Q24H  . fluticasone  2 spray Each Nare Daily  . guaiFENesin  600 mg Oral BID  . mirabegron ER  50 mg Oral Daily  . multivitamin with minerals  1 tablet Oral Daily  . pantoprazole  40 mg Oral Daily  . simvastatin  20 mg Oral q1800  . sodium chloride flush  3 mL Intravenous Q12H   Continuous Infusions:    LOS: 4 days    Time spent: > 35 minutes  Jasa Dundon, MD PhD [facility] Pager [phone]  If 7PM-7AM, please contact night-coverage [email or web address] Password [email or web address] 11/20/2016, 12:31 PM

## 2016-11-20 NOTE — Clinical Social Work Note (Signed)
Clinical Social Work Assessment  Patient Details  Name: Jeff Rivera MRN: [id number] Date of Birth: 08-18-1941  Date of referral:  11/20/16               Reason for consult:  Facility Placement                Permission sought to share information with:  Facility Sport and exercise psychologist, Family Supports Permission granted to share information::  Yes, Verbal Permission Granted  Name::     Medical illustrator::  SNFs  Relationship::  Spouse  Contact Information:  [phone]  Housing/Transportation Living arrangements for the past 2 months:  Single Family Home Source of Information:  Patient Patient Interpreter Needed:  None Criminal Activity/Legal Involvement Pertinent to Current Situation/Hospitalization:  No - Comment as needed Significant Relationships:  Adult Children, Spouse Lives with:  Spouse Do you feel safe going back to the place where you live?  No Need for family participation in patient care:  Yes (Comment)  Care giving concerns:  CSW received consult for possible SNF placement at time of discharge. CSW spoke with patient regarding PT recommendation of SNF placement at time of discharge. Patient reported that patient's spouse is currently unable to care for patient at their home given patient's current physical needs and fall risk. Patient expressed understanding of PT recommendation and is agreeable to SNF placement at time of discharge. CSW to continue to follow and assist with discharge planning needs.   Social Worker assessment / plan:  CSW spoke with patient concerning possibility of rehab at [facility] before returning home.  Employment status:  Retired Forensic scientist:  Medicare PT Recommendations:  Not assessed at this time [place] / Referral to community resources:  [place]  Patient/Family's Response to care:  Patient recognizes need for rehab before returning home and is agreeable to a SNF in [place]. Patient reported preference for  [facility] since he has been there before.  Patient/Family's Understanding of and Emotional Response to Diagnosis, Current Treatment, and Prognosis:  Patient/family is realistic regarding therapy needs and expressed being hopeful for SNF placement. Patient expressed understanding of CSW role and discharge process. No questions/concerns about plan or treatment.    Emotional Assessment Appearance:  Appears stated age Attitude/Demeanor/Rapport:  Other (Appropriate) Affect (typically observed):  Accepting, Appropriate Orientation:  Oriented to Self, Oriented to Place, Oriented to  Time, Oriented to Situation Alcohol / Substance use:  Not Applicable Psych involvement (Current and /or in the community):  No (Comment)  Discharge Needs  Concerns to be addressed:  Care Coordination Readmission within the last 30 days:  No Current discharge risk:  None Barriers to Discharge:  Continued Medical Work up   [employer], [place] 11/20/2016, 10:23 AM

## 2016-11-20 NOTE — Evaluation (Signed)
Physical Therapy Evaluation Patient Details Name: Jeff Rivera MRN: [id number] DOB: [birth year] Today's Date: 11/20/2016   History of Present Illness  76 y.o. with known hx of HTN, CKD stage III, chronic GI bleed, recent dx of DVT back in December when he was initially placed on Xarelto but subsequently developed GI bleed, just discharged 2/3 (was hospitalized from 2/2 - 2/3 for IVC filter placement), now presented from home by his son for evaluation of progressively worsening confusion, poor oral intake since being discharged  Clinical Impression  Pt admitted with above diagnosis. Pt currently with functional limitations due to the deficits listed below (see PT Problem List).  Pt will benefit from skilled PT to increase their independence and safety with mobility to allow discharge to the venue listed below.  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  Recommend SNF and pt's daughter is in agreement.     Follow Up Recommendations SNF;Supervision/Assistance - 24 hour    Equipment Recommendations  None recommended by PT    Recommendations for Other Services       Precautions / Restrictions Precautions Precautions: Fall Restrictions Weight Bearing Restrictions: No      Mobility  Bed Mobility Overal bed mobility: Needs Assistance Bed Mobility: Supine to Sit     Supine to sit: Mod assist     General bed mobility comments: Use of bed pad to get hips turned and then A to get trunk upright  Transfers Overall transfer level: Needs assistance Equipment used: Rolling walker (2 wheeled) Transfers: Sit to/from [facility] Sit to Stand: Mod assist Stand pivot transfers: Min assist       General transfer comment: Use of bed pad to A with powering up as pt didn't want PT using gait belt to A with sit > stand.  Cues for hand placement with slow transition of hand from bed >  RW.  Ambulation/Gait Ambulation/Gait assistance: Min assist           General Gait Details: SPT with RW with slow speed and cues for step placement  Stairs            Wheelchair Mobility    Modified Rankin (Stroke Patients Only)       Balance Overall balance assessment: Needs assistance Sitting-balance support: Feet supported Sitting balance-[name]: Fair     Standing balance support: Bilateral upper extremity supported Standing balance-[name]: Poor Standing balance comment: requires B UE support                             Pertinent Vitals/Pain Pain Assessment: Faces Faces Pain Scale: Hurts little more Pain Location: knees Pain Descriptors / Indicators: Grimacing Pain Intervention(s): Limited activity within patient's tolerance;Monitored during session;Repositioned    Home Living Family/patient expects to be discharged to:: Private residence Living Arrangements: Spouse/significant other Available Help at Discharge: Family Type of Home: House Home Access: Stairs to enter Entrance Stairs-Rails: Right;Left;Can reach both Entrance Stairs-Number of Steps: 4 Home Layout: Two level;Able to live on main level with bedroom/bathroom Home Equipment: Gilford Rile - 2 wheels;Cane - single point;Bedside commode;Shower seat - built in;Shower seat Additional Comments: Per daughter, pt's wife has dementia and he is caregiver for her.    Prior Function           Comments: getting outpatient PT at [facility]  Hand Dominance        Extremity/Trunk Assessment   Upper Extremity Assessment Upper Extremity Assessment: Overall WFL for tasks assessed    Lower Extremity Assessment Lower Extremity Assessment: Generalized weakness    Cervical / Trunk Assessment Cervical / Trunk Assessment: Normal  Communication   Communication: No difficulties  Cognition Arousal/Alertness: Awake/alert Behavior During Therapy: WFL for tasks  assessed/performed Overall Cognitive Status: Impaired/Different from baseline Area of Impairment: Memory;Following commands;Safety/judgement     Memory: Decreased short-term memory Following Commands: Follows one step commands consistently;Follows one step commands with increased time Safety/Judgement: Decreased awareness of safety;Decreased awareness of deficits     General Comments: Daughter reports confusion is improving compared to date of admission    General Comments      Exercises     Assessment/Plan    PT Assessment Patient needs continued PT services  PT Problem List Decreased strength;Decreased activity tolerance;Decreased balance;Decreased mobility;Decreased coordination;Decreased safety awareness          PT Treatment Interventions DME instruction;Gait training;Functional mobility training;Therapeutic activities;Therapeutic exercise;Balance training;Patient/family education    PT Goals (Current goals can be found in the Care Plan section)  Acute Rehab PT Goals Patient Stated Goal: Rehab before going home PT Goal Formulation: With family Time For Goal Achievement: 12/04/16 Potential to Achieve Goals: Good    Frequency Min 3X/week   Barriers to discharge Decreased caregiver support      Co-evaluation               End of Session Equipment Utilized During Treatment: Gait belt Activity Tolerance: Patient tolerated treatment well Patient left: in chair;with call bell/phone within reach;with chair alarm set;with family/visitor present (with lab tech present) Nurse Communication: Mobility status         Time: [id number] PT Time Calculation (min) (ACUTE ONLY): 24 min   Charges:   PT Evaluation $PT Eval Moderate Complexity: 1 Procedure PT Treatments $Therapeutic Activity: 8-22 mins   PT G Codes:        Zara Wendt LUBECK 11/20/2016, 1:09 PM

## 2016-11-20 NOTE — Consult Note (Signed)
   [facility] CM Inpatient Consult   11/20/2016  BRAIAN MERRIGAN [birth year] [id number]   Patient assess for [facility] Care Management as a benefit of the Medicare ACO Registry and with a 4th hospitalization in the past 6 months with a recent re-admission.  Chart review reveals the patient 76 y.o.with known hx of HTN, CKD stage III, chronic GI bleed, recent dx of DVT back in December when he was initially placed on Xarelto but subsequently developed GI bleed, just discharged 2/3 (was hospitalized from 2/2 - 2/3 for IVC filter placement), now presented from home by his son for evaluation of progressively worsening confusion, poor oral intake since being discharged.  According to the charted notes, the patient is being recommended for a skilled care facility for rehab.  Went by to speak with patient and the patient was starting his session with therapy and also for lab work.   Will follow for progression and disposition for any [facility] Care Management needs for community follow up. For questions, please contact:  Natividad Brood, RN BSN [facility] Liaison  [phone] business mobile phone Toll free office [phone]

## 2016-11-20 NOTE — Care Management Important Message (Signed)
Important Message  Patient Details  Name: Jeff Rivera MRN: [id number] Date of Birth: 04/17/1941   Medicare Important Message Given:  Yes    [name] [name] 11/20/2016, 11:07 AM

## 2016-11-21 ENCOUNTER — Inpatient Hospital Stay (HOSPITAL_COMMUNITY): Payer: Medicare Other

## 2016-11-21 DIAGNOSIS — G9340 Encephalopathy, unspecified: Secondary | ICD-10-CM

## 2016-11-21 LAB — CULTURE, BLOOD (ROUTINE X 2)
CULTURE: NO GROWTH
Culture: NO GROWTH

## 2016-11-21 LAB — LIPID PANEL
CHOL/HDL RATIO: 5.8 ratio
Cholesterol: 173 mg/dL (ref 0–200)
HDL: 30 mg/dL — AB (ref >40)
LDL Cholesterol: 127 mg/dL — ABNORMAL HIGH (ref 0–99)
TRIGLYCERIDES: 81 mg/dL (ref <150)
VLDL: 16 mg/dL (ref 0–40)

## 2016-11-21 LAB — IRON AND TIBC
Iron: 12 ug/dL — ABNORMAL LOW (ref 45–182)
SATURATION RATIOS: 6 % — AB (ref 17.9–39.5)
TIBC: 209 ug/dL — ABNORMAL LOW (ref 250–450)
UIBC: 197 ug/dL

## 2016-11-21 LAB — OCCULT BLOOD X 1 CARD TO LAB, STOOL: FECAL OCCULT BLD: POSITIVE — AB

## 2016-11-21 LAB — COMPREHENSIVE METABOLIC PANEL
ALT: 54 U/L (ref 17–63)
AST: 45 U/L — AB (ref 15–41)
Albumin: 2.3 g/dL — ABNORMAL LOW (ref 3.5–5.0)
Alkaline Phosphatase: 48 U/L (ref 38–126)
Anion gap: 9 (ref 5–15)
BUN: 16 mg/dL (ref 6–20)
CHLORIDE: 102 mmol/L (ref 101–111)
CO2: 26 mmol/L (ref 22–32)
CREATININE: 1.46 mg/dL — AB (ref 0.61–1.24)
Calcium: 9.7 mg/dL (ref 8.9–10.3)
GFR calc non Af Amer: 45 mL/min — ABNORMAL LOW (ref >60)
GFR, EST AFRICAN AMERICAN: 52 mL/min — AB (ref >60)
Glucose, Bld: 100 mg/dL — ABNORMAL HIGH (ref 65–99)
POTASSIUM: 3.8 mmol/L (ref 3.5–5.1)
SODIUM: 137 mmol/L (ref 135–145)
Total Bilirubin: 0.5 mg/dL (ref 0.3–1.2)
Total Protein: 5.5 g/dL — ABNORMAL LOW (ref 6.5–8.1)

## 2016-11-21 LAB — URINALYSIS, ROUTINE W REFLEX MICROSCOPIC
BILIRUBIN URINE: NEGATIVE
Glucose, UA: NEGATIVE mg/dL
KETONES UR: 5 mg/dL — AB
LEUKOCYTES UA: NEGATIVE
NITRITE: NEGATIVE
PROTEIN: NEGATIVE mg/dL
SQUAMOUS EPITHELIAL / LPF: NONE SEEN
Specific Gravity, Urine: 1.011 (ref 1.005–1.030)
pH: 6 (ref 5.0–8.0)

## 2016-11-21 LAB — AMMONIA: AMMONIA: 16 umol/L (ref 9–35)

## 2016-11-21 LAB — TSH: TSH: 1.891 u[IU]/mL (ref 0.350–4.500)

## 2016-11-21 LAB — CBC
HCT: 27 % — ABNORMAL LOW (ref 39.0–52.0)
Hemoglobin: 8.8 g/dL — ABNORMAL LOW (ref 13.0–17.0)
MCH: 29.8 pg (ref 26.0–34.0)
MCHC: 32.6 g/dL (ref 30.0–36.0)
MCV: 91.5 fL (ref 78.0–100.0)
Platelets: 341 10*3/uL (ref 150–400)
RBC: 2.95 MIL/uL — ABNORMAL LOW (ref 4.22–5.81)
RDW: 14.7 % (ref 11.5–15.5)
WBC: 8.4 10*3/uL (ref 4.0–10.5)

## 2016-11-21 LAB — CK: CK TOTAL: 489 U/L — AB (ref 49–397)

## 2016-11-21 LAB — VITAMIN B12: VITAMIN B 12: 1817 pg/mL — AB (ref 180–914)

## 2016-11-21 LAB — FOLATE: Folate: 22.1 ng/mL (ref >5.9)

## 2016-11-21 LAB — RPR: RPR Ser Ql: NONREACTIVE

## 2016-11-21 LAB — HIV ANTIBODY (ROUTINE TESTING W REFLEX): HIV Screen 4th Generation wRfx: NONREACTIVE

## 2016-11-21 MED ORDER — IOPAMIDOL (ISOVUE-300) INJECTION 61%
15.0000 mL | INTRAVENOUS | Status: AC
  Administered 2016-11-21 (×2): 15 mL via ORAL

## 2016-11-21 MED ORDER — FUROSEMIDE 10 MG/ML IJ SOLN
40.0000 mg | Freq: Once | INTRAMUSCULAR | Status: AC
  Administered 2016-11-21: 40 mg via INTRAVENOUS
  Filled 2016-11-21: qty 4

## 2016-11-21 MED ORDER — SENNOSIDES-DOCUSATE SODIUM 8.6-50 MG PO TABS
2.0000 | ORAL_TABLET | Freq: Two times a day (BID) | ORAL | Status: DC
  Administered 2016-11-21 – 2016-11-25 (×8): 2 via ORAL
  Filled 2016-11-21 (×8): qty 2

## 2016-11-21 NOTE — Progress Notes (Signed)
PROGRESS NOTE    Jeff Rivera  [id number] DOB: November 15, 1940 DOA: 11/16/2016 PCP: Thressa Sheller, MD    Brief Narrative:   76 y.o. with known hx of HTN, CKD stage III, chronic GI bleed, recent dx of DVT back in December when he was initially placed on Xarelto but subsequently developed GI bleed, just discharged 2/3 (was hospitalized from 2/2 - 2/3 for IVC filter placement), now presented from home by his son for evaluation of progressively worsening confusion, poor oral intake since being discharged   Assessment & Plan:   Metabolic encephlophaty/progressive confusion (presenting symptom) Likely multifactorial, from pneumonia Ct head showed normal pressure hydrocephalus, no acute findings. Improving, but per family not back to baseline. Check rpr negative, hiv negative, b12/ folate unremarkable, tsh wnl, ammonia 16,  need outpatient follow up with neurology for normal presure hydrocephalus, I have explained this to his daughter, she expressed understanding Patient has intermittent agitation, delirium, sitter in room , trial of seroquel.   HCAP (healthcare-associated pneumonia)/sepsis presented on admission Cxr left lower lobe infiltrate, he presented with confusion, leukocytosis wbc14.2, fever 102 Respiratory viral penal negative, sputum culture not collected, blood culture no growth, mrsa screening negative, urine strep pneumonia antigen negative,  -he is treated with vanc/zosyn since admission, with clinical improvement, wbc has normalized, less confusion, change abx to oral doxycycline -spike another fever 100.8 on 2/9, repeat culture, repeat imaging  lft elevation, with normal bili and alk phos  from sepsis? Ck mildly elevated,  hepatitis panel, liver US gallbladder sludge, fatty liver   CKD III:  cr 1.3-1.6 close to baseline ua no infection Renal dosing meds  Acute deep vein thrombosis (DVT) of femoral vein of right lower extremity ([place]) , he was hospitalized for this from   2/2 to 2/3 - IVC filter placed secondary to GI bleed. xarelto held - Pain control an issue, family report patient is not able to tolerate any narcotic ( all cause confusion), except ultram, he is on prn ultram    Chronic Anemia: normocytic With h/o gi bleed, off anticoagulation, off asa Anemia work up    CAD (coronary artery disease) - continue statin, asa discontinued from recent hospitalization due to gi bleed - no chest pain reported    Dyslipidemia  - stable on statin. May need t od/c statin due to elevated ck,    Small pleural effusion, lower extremity edema, resume lasix on 2/9, monitor effect, monitor cr  FTT: PT/OT/SNF   DVT prophylaxis: Lovenox Code Status: Full Family Communication: patient , his wife and daughter at bedside Disposition Plan:  SNF in 1-2 days   Consultants:   none   Procedures: none   Antimicrobials: vancomycin, zosyn from admission to 2/8  Doxycycline from 2/8 to   Subjective: Drowsy, not oriented Daughter and wife in room  Objective: Vitals:   11/20/16 0559 11/20/16 1425 11/20/16 2235 11/21/16 0327  BP: (!) 160/77 134/63 (!) 140/49   Pulse: 98 (!) 117 (!) 109   Resp: _0 Temp: 98.7 F (37.1 C) 98.5 F (36.9 C) (!) 100.8 F (38.2 C)   TempSrc: Oral Oral Oral   SpO2: 98% 97% 96%   Weight: 82.2 kg (181 lb 3.2 oz)   72.6 kg (160 lb)  Height:        Intake/Output Summary (Last 24 hours) at 11/21/16 1338 Last data filed at 11/21/16 0539  Gross per 24 hour  Intake              240  ml  Output              425 ml  Net             -185 ml   Filed Weights   11/19/16 0650 11/20/16 0559 11/21/16 0327  Weight: 81.9 kg (180 lb 8 oz) 82.2 kg (181 lb 3.2 oz) 72.6 kg (160 lb)    Examination:  General exam: drowsy ,not oriented to place Respiratory system: Clear to auscultation. Respiratory effort normal. Equal chest rise. Decreased breath sounds of left base Cardiovascular system: S1 & S2 heard, RRR. No JVD, murmurs, rubs,  gallops or clicks. No pedal edema. Gastrointestinal system: Abdomen is nondistended, soft and nontender. No organomegaly or masses felt. Normal bowel sounds heard. Central nervous system: Alert and Awake, No focal neurological deficits. Extremities: Symmetric 5 x 5 power. Edema right >left lower extremity Skin: No rashes, lesions or ulcers on limited exam, on limited exam. Musculoskeletal: Neck pain with palpation Psychiatry:Mood & affect appropriate.     Data Reviewed: I have personally reviewed following labs and imaging studies  CBC:  Recent Labs Lab 11/16/16 1449 11/17/16 0027 11/19/16 0018 11/20/16 0436 11/21/16 0450  WBC 14.2* 11.6* 10.8* 8.2 8.4  NEUTROABS  --   --   --  6.1  --   HGB 9.9* 8.9* 8.3* 8.6* 8.8*  HCT 29.7* 26.5* 24.7* 26.1* 27.0*  MCV 91.7 92.0 92.2 92.6 91.5  PLT 297 247 262 297 569   Basic Metabolic Panel:  Recent Labs Lab 11/16/16 1449 11/17/16 0027 11/19/16 0018 11/20/16 0436 11/21/16 0450  NA 135 138 137 141 137  K 4.4 4.3 3.9 3.7 3.8  CL 103 107 105 107 102  CO2 _0 GLUCOSE 105* 109* 118* 111* 100*  BUN _1 CREATININE 1.49* 1.54* 1.60* 1.39* 1.46*  CALCIUM 9.7 9.2 8.8* 9.1 9.7   GFR: Estimated Creatinine Clearance: 40.3 mL/min (by C-G formula based on SCr of 1.46 mg/dL (H)). Liver Function Tests:  Recent Labs Lab 11/20/16 0436 11/21/16 0450  AST 61* 45*  ALT 65* 54  ALKPHOS 46 48  BILITOT 0.6 0.5  PROT 5.5* 5.5*  ALBUMIN 2.2* 2.3*   No results for input(s): LIPASE, AMYLASE in the last 168 hours.  Recent Labs Lab 11/21/16 0450  AMMONIA 16   Coagulation Profile:  Recent Labs Lab 11/16/16 2150  INR 1.34   Cardiac Enzymes:  Recent Labs Lab 11/21/16 0450  CKTOTAL 489*   BNP (last 3 results) No results for input(s): PROBNP in the last 8760 hours. HbA1C: No results for input(s): HGBA1C in the last 72 hours. CBG: No results for input(s): GLUCAP in the last 168 hours. Lipid  Profile:  Recent Labs  11/21/16 0450  CHOL 173  HDL 30*  LDLCALC 127*  TRIG 81  CHOLHDL 5.8   Thyroid Function Tests:  Recent Labs  11/21/16 0450  TSH 1.891   Anemia Panel:  Recent Labs  11/20/16 1230 11/21/16 0450  VITAMINB12  --  1,817*  FOLATE  --  22.1  TIBC  --  209*  IRON  --  12*  RETICCTPCT 1.9  --    Sepsis Labs:  Recent Labs Lab 11/16/16 1719 11/16/16 2150 11/17/16 0027  PROCALCITON  --  0.25  --   LATICACIDVEN 1.21 0.9 0.8    Recent Results (from the past 240 hour(s))  Blood culture (routine x 2)     Status: None (Preliminary result)   Collection  Time: 11/16/16  4:56 PM  Result Value Ref Range Status   Specimen Description BLOOD RIGHT WRIST  Final   Special Requests BOTTLES DRAWN AEROBIC AND ANAEROBIC 5CC  Final   Culture NO GROWTH 4 DAYS  Final   Report Status PENDING  Incomplete  Blood culture (routine x 2)     Status: None (Preliminary result)   Collection Time: 11/16/16  5:00 PM  Result Value Ref Range Status   Specimen Description BLOOD LEFT WRIST  Final   Special Requests AEROBIC BOTTLE ONLY 10CC  Final   Culture NO GROWTH 4 DAYS  Final   Report Status PENDING  Incomplete  Respiratory Panel by PCR     Status: None   Collection Time: 11/16/16  7:47 PM  Result Value Ref Range Status   Adenovirus NOT DETECTED NOT DETECTED Final   Coronavirus 229E NOT DETECTED NOT DETECTED Final   Coronavirus HKU1 NOT DETECTED NOT DETECTED Final   Coronavirus NL63 NOT DETECTED NOT DETECTED Final   Coronavirus OC43 NOT DETECTED NOT DETECTED Final   Metapneumovirus NOT DETECTED NOT DETECTED Final   Rhinovirus / Enterovirus NOT DETECTED NOT DETECTED Final   Influenza A NOT DETECTED NOT DETECTED Final   Influenza B NOT DETECTED NOT DETECTED Final   Parainfluenza Virus 1 NOT DETECTED NOT DETECTED Final   Parainfluenza Virus 2 NOT DETECTED NOT DETECTED Final   Parainfluenza Virus 3 NOT DETECTED NOT DETECTED Final   Parainfluenza Virus 4 NOT DETECTED NOT  DETECTED Final   Respiratory Syncytial Virus NOT DETECTED NOT DETECTED Final   Bordetella pertussis NOT DETECTED NOT DETECTED Final   Chlamydophila pneumoniae NOT DETECTED NOT DETECTED Final   Mycoplasma pneumoniae NOT DETECTED NOT DETECTED Final  MRSA PCR Screening     Status: None   Collection Time: 11/19/16  9:04 PM  Result Value Ref Range Status   MRSA by PCR NEGATIVE NEGATIVE Final    Comment:        The GeneXpert MRSA Assay (FDA approved for NASAL specimens only), is one component of a comprehensive MRSA colonization surveillance program. It is not intended to diagnose MRSA infection nor to guide or monitor treatment for MRSA infections.          Radiology Studies: Ct Abdomen Pelvis Wo Contrast  Result Date: 11/21/2016 CLINICAL DATA:  Fever. EXAM: CT CHEST, ABDOMEN AND PELVIS WITHOUT CONTRAST TECHNIQUE: Multidetector CT imaging of the chest, abdomen and pelvis was performed following the standard protocol without IV contrast. COMPARISON:  09/20/2008 abdominal CT FINDINGS: CT CHEST FINDINGS Cardiovascular: No cardiomegaly or pericardial effusion. Atherosclerosis, including the coronary arteries. Prominent aortic valvular/annular calcification. Negative esophagus. Mediastinum/Nodes: Negative for mass or adenopathy. Lungs/Pleura: Trace layering pleural effusions. 5 mm pulmonary nodule below the right minor fissure has a flat morphology most consistent with lymph node. No suspicious pulmonary nodule. Mild atelectasis at the bases. Musculoskeletal: No acute or aggressive finding CT ABDOMEN PELVIS FINDINGS Hepatobiliary: Small scattered cystic density structures in the liver. No concerning change since since 2009.Cholelithiasis. No evidence of acute cholecystitis. Normal common bile duct diameter. Pancreas: Unremarkable. Spleen: Unremarkable. Adrenals/Urinary Tract: Negative adrenals. No hydronephrosis or ureteral calculus. 2 left renal calculi measuring up to 4 mm in the upper pole.  Unremarkable bladder. Stomach/Bowel: Formed stool throughout the colon. No small bowel obstruction or inflammation. Presacral edema may be related to rectal distention (although only moderate) or volume overload. There is subcutaneous edema about the bilateral hips. Negative appendix. Colonic diverticulosis. Vascular/Lymphatic: No acute vascular abnormality. IVC filter. Aortic atherosclerosis.  No mass or adenopathy. Reproductive:No pathologic findings. Other: No ascites or pneumoperitoneum. Left inguinal hernia containing nonobstructed sigmoid colon. Musculoskeletal: There is asymmetric fat edema around the upper thigh and lateral gluteal musculature on the right IMPRESSION: 1. No definite explanation for fever. 2. Asymmetric fat edema around the right upper femur which could be posttraumatic or inflammatory. Negative for hip effusion or acute osseous finding. 3. Trace pleural effusions and mild atelectasis. 4. Cholelithiasis. 5. Nonobstructive left nephrolithiasis. 6. Left inguinal hernia containing nonobstructed colon. 7. Moderate stool volume. Electronically Signed   By: Monte Fantasia M.D.   On: 11/21/2016 13:18   Ct Chest Wo Contrast  Result Date: 11/21/2016 CLINICAL DATA:  Fever. EXAM: CT CHEST, ABDOMEN AND PELVIS WITHOUT CONTRAST TECHNIQUE: Multidetector CT imaging of the chest, abdomen and pelvis was performed following the standard protocol without IV contrast. COMPARISON:  09/20/2008 abdominal CT FINDINGS: CT CHEST FINDINGS Cardiovascular: No cardiomegaly or pericardial effusion. Atherosclerosis, including the coronary arteries. Prominent aortic valvular/annular calcification. Negative esophagus. Mediastinum/Nodes: Negative for mass or adenopathy. Lungs/Pleura: Trace layering pleural effusions. 5 mm pulmonary nodule below the right minor fissure has a flat morphology most consistent with lymph node. No suspicious pulmonary nodule. Mild atelectasis at the bases. Musculoskeletal: No acute or aggressive  finding CT ABDOMEN PELVIS FINDINGS Hepatobiliary: Small scattered cystic density structures in the liver. No concerning change since since 2009.Cholelithiasis. No evidence of acute cholecystitis. Normal common bile duct diameter. Pancreas: Unremarkable. Spleen: Unremarkable. Adrenals/Urinary Tract: Negative adrenals. No hydronephrosis or ureteral calculus. 2 left renal calculi measuring up to 4 mm in the upper pole. Unremarkable bladder. Stomach/Bowel: Formed stool throughout the colon. No small bowel obstruction or inflammation. Presacral edema may be related to rectal distention (although only moderate) or volume overload. There is subcutaneous edema about the bilateral hips. Negative appendix. Colonic diverticulosis. Vascular/Lymphatic: No acute vascular abnormality. IVC filter. Aortic atherosclerosis. No mass or adenopathy. Reproductive:No pathologic findings. Other: No ascites or pneumoperitoneum. Left inguinal hernia containing nonobstructed sigmoid colon. Musculoskeletal: There is asymmetric fat edema around the upper thigh and lateral gluteal musculature on the right IMPRESSION: 1. No definite explanation for fever. 2. Asymmetric fat edema around the right upper femur which could be posttraumatic or inflammatory. Negative for hip effusion or acute osseous finding. 3. Trace pleural effusions and mild atelectasis. 4. Cholelithiasis. 5. Nonobstructive left nephrolithiasis. 6. Left inguinal hernia containing nonobstructed colon. 7. Moderate stool volume. Electronically Signed   By: Monte Fantasia M.D.   On: 11/21/2016 13:18   US Abdomen Limited Ruq  Result Date: 11/20/2016 CLINICAL DATA:  Elevated liver function studies. History of hypertension, GE reflux disease, dyslipidemia, liver cysts. EXAM: US ABDOMEN LIMITED - RIGHT UPPER QUADRANT COMPARISON:  CT abdomen and pelvis 09/20/2008 FINDINGS: Gallbladder: Small amount of sludge layering in the gallbladder. No stones identified. No gallbladder wall  thickening. Murphy's sign is negative. Common bile duct: Diameter: 3.2 mm, normal Liver: Liver parenchymal echotexture is diffusely increased suggesting fatty infiltration. Cystic lesions seen on prior CT are not well identified at ultrasound. Examination is limited due to patient's body habitus and inability to reposition the patient. IMPRESSION: Gallbladder sludge. No stones. No additional changes to suggest cholecystitis. Probable fatty infiltration of the liver. Electronically Signed   By: Lucienne Capers M.D.   On: 11/20/2016 22:07        Scheduled Meds: . doxycycline  100 mg Oral Q12H  . enoxaparin (LOVENOX) injection  40 mg Subcutaneous Q24H  . fluticasone  2 spray Each Nare Daily  . guaiFENesin  600 mg Oral BID  . mirabegron ER  50 mg Oral Daily  . multivitamin with minerals  1 tablet Oral Daily  . pantoprazole  40 mg Oral Daily  . QUEtiapine  25 mg Oral BID  . simvastatin  20 mg Oral q1800  . sodium chloride flush  3 mL Intravenous Q12H   Continuous Infusions:    LOS: 5 days    Time spent: > 35 minutes  Thersa Mohiuddin, MD PhD [facility] Pager [phone]  If 7PM-7AM, please contact night-coverage [email or web address] Password [email or web address] 11/21/2016, 1:38 PM

## 2016-11-22 DIAGNOSIS — N189 Chronic kidney disease, unspecified: Secondary | ICD-10-CM | POA: Diagnosis present

## 2016-11-22 DIAGNOSIS — R000 Tachycardia, unspecified: Secondary | ICD-10-CM

## 2016-11-22 DIAGNOSIS — R410 Disorientation, unspecified: Secondary | ICD-10-CM

## 2016-11-22 DIAGNOSIS — M1990 Unspecified osteoarthritis, unspecified site: Secondary | ICD-10-CM | POA: Diagnosis present

## 2016-11-22 DIAGNOSIS — R600 Localized edema: Secondary | ICD-10-CM

## 2016-11-22 DIAGNOSIS — Z87891 Personal history of nicotine dependence: Secondary | ICD-10-CM

## 2016-11-22 DIAGNOSIS — I82401 Acute embolism and thrombosis of unspecified deep veins of right lower extremity: Secondary | ICD-10-CM

## 2016-11-22 DIAGNOSIS — Z8249 Family history of ischemic heart disease and other diseases of the circulatory system: Secondary | ICD-10-CM

## 2016-11-22 DIAGNOSIS — Z95828 Presence of other vascular implants and grafts: Secondary | ICD-10-CM

## 2016-11-22 DIAGNOSIS — R509 Fever, unspecified: Secondary | ICD-10-CM

## 2016-11-22 DIAGNOSIS — K922 Gastrointestinal hemorrhage, unspecified: Secondary | ICD-10-CM | POA: Diagnosis not present

## 2016-11-22 DIAGNOSIS — Z96652 Presence of left artificial knee joint: Secondary | ICD-10-CM

## 2016-11-22 DIAGNOSIS — Z96651 Presence of right artificial knee joint: Secondary | ICD-10-CM

## 2016-11-22 DIAGNOSIS — Z8719 Personal history of other diseases of the digestive system: Secondary | ICD-10-CM

## 2016-11-22 LAB — CBC WITH DIFFERENTIAL/PLATELET
BASOS ABS: 0 10*3/uL (ref 0.0–0.1)
BASOS PCT: 0 %
EOS PCT: 1 %
Eosinophils Absolute: 0.1 10*3/uL (ref 0.0–0.7)
HCT: 31.8 % — ABNORMAL LOW (ref 39.0–52.0)
Hemoglobin: 10.6 g/dL — ABNORMAL LOW (ref 13.0–17.0)
Lymphocytes Relative: 6 %
Lymphs Abs: 0.6 10*3/uL — ABNORMAL LOW (ref 0.7–4.0)
MCH: 30.5 pg (ref 26.0–34.0)
MCHC: 33.3 g/dL (ref 30.0–36.0)
MCV: 91.4 fL (ref 78.0–100.0)
MONO ABS: 1.2 10*3/uL — AB (ref 0.1–1.0)
Monocytes Relative: 11 %
NEUTROS ABS: 9 10*3/uL — AB (ref 1.7–7.7)
Neutrophils Relative %: 82 %
PLATELETS: 470 10*3/uL — AB (ref 150–400)
RBC: 3.48 MIL/uL — ABNORMAL LOW (ref 4.22–5.81)
RDW: 14.9 % (ref 11.5–15.5)
WBC: 10.9 10*3/uL — ABNORMAL HIGH (ref 4.0–10.5)

## 2016-11-22 LAB — COMPREHENSIVE METABOLIC PANEL
ALBUMIN: 2.8 g/dL — AB (ref 3.5–5.0)
ALT: 53 U/L (ref 17–63)
AST: 38 U/L (ref 15–41)
Alkaline Phosphatase: 54 U/L (ref 38–126)
Anion gap: 12 (ref 5–15)
BUN: 18 mg/dL (ref 6–20)
CHLORIDE: 97 mmol/L — AB (ref 101–111)
CO2: 29 mmol/L (ref 22–32)
Calcium: 10.4 mg/dL — ABNORMAL HIGH (ref 8.9–10.3)
Creatinine, Ser: 1.4 mg/dL — ABNORMAL HIGH (ref 0.61–1.24)
GFR calc Af Amer: 54 mL/min — ABNORMAL LOW (ref >60)
GFR calc non Af Amer: 47 mL/min — ABNORMAL LOW (ref >60)
GLUCOSE: 110 mg/dL — AB (ref 65–99)
POTASSIUM: 3.8 mmol/L (ref 3.5–5.1)
Sodium: 138 mmol/L (ref 135–145)
Total Bilirubin: 0.8 mg/dL (ref 0.3–1.2)
Total Protein: 6.6 g/dL (ref 6.5–8.1)

## 2016-11-22 LAB — URINE CULTURE: CULTURE: NO GROWTH

## 2016-11-22 LAB — HEPATITIS PANEL, ACUTE
HEP B S AG: NEGATIVE
Hep A IgM: NEGATIVE
Hep B C IgM: NEGATIVE

## 2016-11-22 LAB — C-REACTIVE PROTEIN: CRP: 27.8 mg/dL — AB (ref <1.0)

## 2016-11-22 LAB — CK: Total CK: 179 U/L (ref 49–397)

## 2016-11-22 LAB — OCCULT BLOOD X 1 CARD TO LAB, STOOL: Fecal Occult Bld: POSITIVE — AB

## 2016-11-22 LAB — SEDIMENTATION RATE: SED RATE: 110 mm/h — AB (ref 0–16)

## 2016-11-22 NOTE — Consult Note (Signed)
[facility]    Date of Admission:  11/16/2016    Total days of antibiotics 7        Day 3 doxycycline              Reason for Consult: Persistent fever    Referring Physician: Dr. Florencia Reasons  Principal Problem:   Fever Active Problems:   DVT (deep venous thrombosis) (HCC)   GI bleed   S/P IVC filter   CAD (coronary artery disease)   Aortic stenosis   Dyslipidemia   Anemia   Acute blood loss anemia   CRI (chronic renal insufficiency)   Degenerative joint disease   Status post left knee replacement   . doxycycline  100 mg Oral Q12H  . enoxaparin (LOVENOX) injection  40 mg Subcutaneous Q24H  . fluticasone  2 spray Each Nare Daily  . guaiFENesin  600 mg Oral BID  . mirabegron ER  50 mg Oral Daily  . multivitamin with minerals  1 tablet Oral Daily  . pantoprazole  40 mg Oral Daily  . QUEtiapine  25 mg Oral BID  . senna-docusate  2 tablet Oral BID  . simvastatin  20 mg Oral q1800  . sodium chloride flush  3 mL Intravenous Q12H    Recommendations: 1. Discontinue doxycycline and observe off of antibiotics   Assessment: The cause of his fever remains unclear. I'm not convinced that he had left lower lobe pneumonia. Blood cultures are negative. He has no evidence of symptomatic UTI. I do not find any evidence of drug fever. His recent CT scan raised some question of soft tissue inflammation in his right thigh but there is no abnormality on exam. I favor stopping doxycycline and observing off of antibiotics. I will follow-up tomorrow.    HPI: Jeff Rivera is a 76 y.o. male with multiple medical problems. He underwent right total knee arthroplasty last October. He was diagnosed with right leg DVT in early December. He was started on rivaroxaban but developed recurrent GI bleeding leading to admission to the hospital on 11/13/2016. His anticoagulation was stopped and he had an IVC filter placed. He was readmitted on 11/16/2016 with altered mental  status and a temperature of 102.5. His exam did not reveal any obvious source for fever. He was started on empiric vancomycin and piperacillin tazobactam. Admission blood cultures are negative. Chest x-ray suggested possible left lower lobe infiltrate but a follow-up CT scan showed no acute abnormalities. His empiric antibiotic therapy was narrowed to doxycycline 2 days ago but he has continued to have intermittent low-grade fevers up to 101.   Review of Systems: Review of Systems  Constitutional: Positive for fever and malaise/fatigue. Negative for chills, diaphoresis and weight loss.       I'm not absolutely certain how reliable the review of systems is because he appears to be somewhat confused.  HENT: Negative for sore throat.   Respiratory: Negative for cough, sputum production and shortness of breath.   Cardiovascular: Negative for chest pain.  Gastrointestinal: Negative for abdominal pain, diarrhea, heartburn, nausea and vomiting.  Genitourinary: Negative for dysuria and frequency.  Musculoskeletal: Negative for joint pain and myalgias.  Skin: Negative for itching and rash.  Neurological: Negative for dizziness and headaches.    Past Medical History:  Diagnosis Date  . Aortic stenosis    mild   . Arthritis    OA  . CAD (coronary artery disease)   . Chronic  kidney disease    creatine 1.5 was 1.5 09/2015  . Complication of anesthesia    patient was confused for 6-8 days.  doesnt remember anything from time he registered several days after he was admitted to [facility]  . Dyslipidemia   . ED (erectile dysfunction)   . GERD (gastroesophageal reflux disease)   . Heart murmur   . History of kidney stones   . Hypertension     Social History  Substance Use Topics  . Smoking status: Former Smoker    Years: 4.00    Types: Cigarettes    Quit date: 09/05/1961  . Smokeless tobacco: Never Used  . Alcohol use 2.4 oz/week    1 Standard drinks or equivalent, 3 Glasses of wine per  week     Comment: 3-4 GLASSES WINE PER WEEK    Family History  Problem Relation Age of Onset  . Heart attack Father   . Coronary artery disease Father    No Known Allergies  OBJECTIVE: Blood pressure (!) 164/83, pulse (!) 102, temperature 98.7 F (37.1 C), temperature source Oral, resp. rate 17, height 5' 7.5" (1.715 m), weight 165 lb 9.6 oz (75.1 kg), SpO2 95 %.  Physical Exam  Constitutional: He is oriented to person, place, and time.  He is sitting up in a chair feeding himself lunch. He eats and answers questions very slowly.  HENT:  Mouth/Throat: No oropharyngeal exudate.  Eyes: Conjunctivae are normal.  Neck: Neck supple.  Cardiovascular: Regular rhythm.   Murmur heard. He is tachycardic. He has a harsh 2/6 systolic murmur at the right upper sternal border.  Pulmonary/Chest: Effort normal and breath sounds normal. He has no wheezes. He has no rales.  Abdominal: Soft. There is no tenderness.  Musculoskeletal: He exhibits edema. He exhibits no tenderness.  He has diffuse edema of both legs right greater than left. Healed scars over both knees.  Neurological: He is alert and oriented to person, place, and time.  Skin: No rash noted.  Psychiatric:  Flat affect.    Lab Results Lab Results  Component Value Date   WBC 10.9 (H) 11/22/2016   HGB 10.6 (L) 11/22/2016   HCT 31.8 (L) 11/22/2016   MCV 91.4 11/22/2016   PLT 470 (H) 11/22/2016    Lab Results  Component Value Date   CREATININE 1.40 (H) 11/22/2016   BUN 18 11/22/2016   NA 138 11/22/2016   K 3.8 11/22/2016   CL 97 (L) 11/22/2016   CO2 29 11/22/2016    Lab Results  Component Value Date   ALT 53 11/22/2016   AST 38 11/22/2016   ALKPHOS 54 11/22/2016   BILITOT 0.8 11/22/2016     Microbiology: Recent Results (from the past 240 hour(s))  Blood culture (routine x 2)     Status: None   Collection Time: 11/16/16  4:56 PM  Result Value Ref Range Status   Specimen Description BLOOD RIGHT WRIST  Final    Special Requests BOTTLES DRAWN AEROBIC AND ANAEROBIC 5CC  Final   Culture NO GROWTH 5 DAYS  Final   Report Status 11/21/2016 FINAL  Final  Blood culture (routine x 2)     Status: None   Collection Time: 11/16/16  5:00 PM  Result Value Ref Range Status   Specimen Description BLOOD LEFT WRIST  Final   Special Requests AEROBIC BOTTLE ONLY 10CC  Final   Culture NO GROWTH 5 DAYS  Final   Report Status 11/21/2016 FINAL  Final  Respiratory Panel by  PCR     Status: None   Collection Time: 11/16/16  7:47 PM  Result Value Ref Range Status   Adenovirus NOT DETECTED NOT DETECTED Final   Coronavirus 229E NOT DETECTED NOT DETECTED Final   Coronavirus HKU1 NOT DETECTED NOT DETECTED Final   Coronavirus NL63 NOT DETECTED NOT DETECTED Final   Coronavirus OC43 NOT DETECTED NOT DETECTED Final   Metapneumovirus NOT DETECTED NOT DETECTED Final   Rhinovirus / Enterovirus NOT DETECTED NOT DETECTED Final   Influenza A NOT DETECTED NOT DETECTED Final   Influenza B NOT DETECTED NOT DETECTED Final   Parainfluenza Virus 1 NOT DETECTED NOT DETECTED Final   Parainfluenza Virus 2 NOT DETECTED NOT DETECTED Final   Parainfluenza Virus 3 NOT DETECTED NOT DETECTED Final   Parainfluenza Virus 4 NOT DETECTED NOT DETECTED Final   Respiratory Syncytial Virus NOT DETECTED NOT DETECTED Final   Bordetella pertussis NOT DETECTED NOT DETECTED Final   Chlamydophila pneumoniae NOT DETECTED NOT DETECTED Final   Mycoplasma pneumoniae NOT DETECTED NOT DETECTED Final  MRSA PCR Screening     Status: None   Collection Time: 11/19/16  9:04 PM  Result Value Ref Range Status   MRSA by PCR NEGATIVE NEGATIVE Final    Comment:        The GeneXpert MRSA Assay (FDA approved for NASAL specimens only), is one component of a comprehensive MRSA colonization surveillance program. It is not intended to diagnose MRSA infection nor to guide or monitor treatment for MRSA infections.   Culture, blood (routine x 2)     Status: None  (Preliminary result)   Collection Time: 11/21/16  9:05 AM  Result Value Ref Range Status   Specimen Description BLOOD RIGHT HAND  Final   Special Requests IN PEDIATRIC BOTTLE 2CC  Final   Culture NO GROWTH 1 DAY  Final   Report Status PENDING  Incomplete  Culture, blood (routine x 2)     Status: None (Preliminary result)   Collection Time: 11/21/16  9:05 AM  Result Value Ref Range Status   Specimen Description BLOOD LEFT ANTECUBITAL  Final   Special Requests IN PEDIATRIC BOTTLE 2CC  Final   Culture NO GROWTH 1 DAY  Final   Report Status PENDING  Incomplete  Urine culture     Status: None   Collection Time: 11/21/16 12:52 PM  Result Value Ref Range Status   Specimen Description URINE, RANDOM  Final   Special Requests NONE  Final   Culture NO GROWTH  Final   Report Status 11/22/2016 FINAL  Final    Michel Bickers, MD [facility] [phone] pager   [phone] cell 11/22/2016, 2:02 PM

## 2016-11-22 NOTE — Progress Notes (Signed)
PROGRESS NOTE    Jeff Rivera  ALP:[id number] DOB: [birth year] DOA: 11/16/2016 PCP: Thressa Sheller, MD    Brief Narrative:   76 y.o. with known hx of HTN, CKD stage III, chronic GI bleed, recent dx of DVT back in December when he was initially placed on Xarelto but subsequently developed GI bleed, just discharged 2/3 (was hospitalized from 2/2 - 2/3 for IVC filter placement), now presented from home by his son for evaluation of progressively worsening confusion, poor oral intake since being discharged   Assessment & Plan:   Metabolic encephlophaty/progressive confusion (presenting symptom) Likely multifactorial, from pneumonia CT head showed normal pressure hydrocephalus, no acute findings. ( mri brain in 2014 with similar findings) RPR /HIV negative, b12/ folate unremarkable, tsh wnl, ammonia 16,  Need outpatient follow up with neurology for normal presure hydrocephalus, I have explained this to his daughter, she expressed understanding Patient has intermittent agitation, delirium, family report patient at baseline no confusion and independent. sitter in room , trial of seroquel.    HCAP (healthcare-associated pneumonia)/sepsis presented on admission CXR left lower lobe infiltrate, he presented with confusion, leukocytosis wbc14.2, fever 102 Respiratory viral penal negative, sputum culture not collected, blood culture no growth, mrsa screening negative, urine strep pneumonia antigen negative  he is treated with vanc/zosyn since admission, with clinical improvement, wbc has normalized, less confusion, change abx to oral doxycycline spike another fever 100.8 on 2/9, 101 on 2/10, CT chest/ab/pel no clear source of infection, repeat UA unremarkable, repeat blood culture no growth, Will check ana/anca/rf  infectious disease consulted   LFT elevation, with normal bili and alk phos, hepatitis panel, liver US gallbladder sludge, fatty liver  from sepsis? CK mildly elevated,  Both ck and lft  now normalized, may be mild rhabdo   CKD III:  cr 1.3-1.6 close to baseline ua no infection Renal dosing meds  Acute deep vein thrombosis (DVT) of femoral vein of right lower extremity ([place]) , he was hospitalized for this from  2/2 to 2/3 - IVC filter placed secondary to GI bleed. xarelto held - Pain control an issue, family report patient is not able to tolerate any narcotic ( all cause confusion), except ultram, he is on prn ultram    Bilateral lower extremity edema: right > left, trial of lasix if bp allows  Chronic Anemia: normocytic With h/o gi bleed, off anticoagulation, off asa Anemia work up    CAD (coronary artery disease) - continue statin, asa discontinued from recent hospitalization due to gi bleed - no chest pain reported    Dyslipidemia  - stable on statin. May need t od/c statin due to elevated ck,    Small pleural effusion, lower extremity edema, resume lasix on 2/9, monitor effect, monitor cr  FTT: PT/OT/SNF   DVT prophylaxis: Lovenox Code Status: Full Family Communication: patient , his wife and daughter at bedside Disposition Plan:  SNF in 1-2 days   Consultants:   Infectious disease   Procedures: none   Antimicrobials: vancomycin, zosyn from admission to 2/8  Doxycycline from 2/8 to    Subjective: Alert and oriented this am, but he does has intermittent agitation and confusion,  Spiking fever No diarrhea, no skin issues  Objective: Vitals:   11/21/16 1435 11/21/16 2248 11/22/16 0201 11/22/16 0452  BP: (!) 164/66 (!) 166/80  (!) 164/83  Pulse: (!) 105 (!) 115  (!) 102  Resp: 20 16  17   Temp: 99.5 F (37.5 C) (!) 101.1 F (38.4 C)  98.7 F (37.1  C)  TempSrc: Axillary Oral  Oral  SpO2: 94% 95%    Weight:   75.1 kg (165 lb 9.6 oz)   Height:        Intake/Output Summary (Last 24 hours) at 11/22/16 1415 Last data filed at 11/22/16 1000  Gross per 24 hour  Intake              660 ml  Output              325 ml  Net               335 ml   Filed Weights   11/20/16 0559 11/21/16 0327 11/22/16 0201  Weight: 82.2 kg (181 lb 3.2 oz) 72.6 kg (160 lb) 75.1 kg (165 lb 9.6 oz)    Examination:  General exam: aaox3 this am Respiratory system: Clear to auscultation. Respiratory effort normal. Equal chest rise. Decreased breath sounds of left base Cardiovascular system: S1 & S2 heard, RRR. No JVD, murmurs, rubs, gallops or clicks. No pedal edema. Gastrointestinal system: Abdomen is nondistended, soft and nontender. No organomegaly or masses felt. Normal bowel sounds heard. Central nervous system: Alert and Awake, No focal neurological deficits. Extremities: Symmetric 5 x 5 power. Edema right >left lower extremity Skin: No rashes, lesions or ulcers on limited exam, on limited exam. Musculoskeletal: Neck pain with palpation Psychiatry:Mood & affect appropriate.     Data Reviewed: I have personally reviewed following labs and imaging studies  CBC:  Recent Labs Lab 11/17/16 0027 11/19/16 0018 11/20/16 0436 11/21/16 0450 11/22/16 1003  WBC 11.6* 10.8* 8.2 8.4 10.9*  NEUTROABS  --   --  6.1  --  9.0*  HGB 8.9* 8.3* 8.6* 8.8* 10.6*  HCT 26.5* 24.7* 26.1* 27.0* 31.8*  MCV 92.0 92.2 92.6 91.5 91.4  PLT 247 262 297 341 188*   Basic Metabolic Panel:  Recent Labs Lab 11/17/16 0027 11/19/16 0018 11/20/16 0436 11/21/16 0450 11/22/16 1003  NA 138 137 141 137 138  K 4.3 3.9 3.7 3.8 3.8  CL 107 105 107 102 97*  CO2 26 25 24 26 29   GLUCOSE 109* 118* 111* 100* 110*  BUN 20 18 16 16 18   CREATININE 1.54* 1.60* 1.39* 1.46* 1.40*  CALCIUM 9.2 8.8* 9.1 9.7 10.4*   GFR: Estimated Creatinine Clearance: 42.1 mL/min (by C-G formula based on SCr of 1.4 mg/dL (H)). Liver Function Tests:  Recent Labs Lab 11/20/16 0436 11/21/16 0450 11/22/16 1003  AST 61* 45* 38  ALT 65* 54 53  ALKPHOS 46 48 54  BILITOT 0.6 0.5 0.8  PROT 5.5* 5.5* 6.6  ALBUMIN 2.2* 2.3* 2.8*   No results for input(s): LIPASE, AMYLASE in the last  168 hours.  Recent Labs Lab 11/21/16 0450  AMMONIA 16   Coagulation Profile:  Recent Labs Lab 11/16/16 2150  INR 1.34   Cardiac Enzymes:  Recent Labs Lab 11/21/16 0450 11/22/16 1003  CKTOTAL 489* 179   BNP (last 3 results) No results for input(s): PROBNP in the last 8760 hours. HbA1C: No results for input(s): HGBA1C in the last 72 hours. CBG: No results for input(s): GLUCAP in the last 168 hours. Lipid Profile:  Recent Labs  11/21/16 0450  CHOL 173  HDL 30*  LDLCALC 127*  TRIG 81  CHOLHDL 5.8   Thyroid Function Tests:  Recent Labs  11/21/16 0450  TSH 1.891   Anemia Panel:  Recent Labs  11/20/16 1230 11/21/16 0450  VITAMINB12  --  1,817*  FOLATE  --  22.1  TIBC  --  209*  IRON  --  12*  RETICCTPCT 1.9  --    Sepsis Labs:  Recent Labs Lab 11/16/16 1719 11/16/16 2150 11/17/16 0027  PROCALCITON  --  0.25  --   LATICACIDVEN 1.21 0.9 0.8    Recent Results (from the past 240 hour(s))  Blood culture (routine x 2)     Status: None   Collection Time: 11/16/16  4:56 PM  Result Value Ref Range Status   Specimen Description BLOOD RIGHT WRIST  Final   Special Requests BOTTLES DRAWN AEROBIC AND ANAEROBIC 5CC  Final   Culture NO GROWTH 5 DAYS  Final   Report Status 11/21/2016 FINAL  Final  Blood culture (routine x 2)     Status: None   Collection Time: 11/16/16  5:00 PM  Result Value Ref Range Status   Specimen Description BLOOD LEFT WRIST  Final   Special Requests AEROBIC BOTTLE ONLY 10CC  Final   Culture NO GROWTH 5 DAYS  Final   Report Status 11/21/2016 FINAL  Final  Respiratory Panel by PCR     Status: None   Collection Time: 11/16/16  7:47 PM  Result Value Ref Range Status   Adenovirus NOT DETECTED NOT DETECTED Final   Coronavirus 229E NOT DETECTED NOT DETECTED Final   Coronavirus HKU1 NOT DETECTED NOT DETECTED Final   Coronavirus NL63 NOT DETECTED NOT DETECTED Final   Coronavirus OC43 NOT DETECTED NOT DETECTED Final   Metapneumovirus  NOT DETECTED NOT DETECTED Final   Rhinovirus / Enterovirus NOT DETECTED NOT DETECTED Final   Influenza A NOT DETECTED NOT DETECTED Final   Influenza B NOT DETECTED NOT DETECTED Final   Parainfluenza Virus 1 NOT DETECTED NOT DETECTED Final   Parainfluenza Virus 2 NOT DETECTED NOT DETECTED Final   Parainfluenza Virus 3 NOT DETECTED NOT DETECTED Final   Parainfluenza Virus 4 NOT DETECTED NOT DETECTED Final   Respiratory Syncytial Virus NOT DETECTED NOT DETECTED Final   Bordetella pertussis NOT DETECTED NOT DETECTED Final   Chlamydophila pneumoniae NOT DETECTED NOT DETECTED Final   Mycoplasma pneumoniae NOT DETECTED NOT DETECTED Final  MRSA PCR Screening     Status: None   Collection Time: 11/19/16  9:04 PM  Result Value Ref Range Status   MRSA by PCR NEGATIVE NEGATIVE Final    Comment:        The GeneXpert MRSA Assay (FDA approved for NASAL specimens only), is one component of a comprehensive MRSA colonization surveillance program. It is not intended to diagnose MRSA infection nor to guide or monitor treatment for MRSA infections.   Culture, blood (routine x 2)     Status: None (Preliminary result)   Collection Time: 11/21/16  9:05 AM  Result Value Ref Range Status   Specimen Description BLOOD RIGHT HAND  Final   Special Requests IN PEDIATRIC BOTTLE 2CC  Final   Culture NO GROWTH 1 DAY  Final   Report Status PENDING  Incomplete  Culture, blood (routine x 2)     Status: None (Preliminary result)   Collection Time: 11/21/16  9:05 AM  Result Value Ref Range Status   Specimen Description BLOOD LEFT ANTECUBITAL  Final   Special Requests IN PEDIATRIC BOTTLE 2CC  Final   Culture NO GROWTH 1 DAY  Final   Report Status PENDING  Incomplete  Urine culture     Status: None   Collection Time: 11/21/16 12:52 PM  Result Value Ref Range Status   Specimen Description URINE,  RANDOM  Final   Special Requests NONE  Final   Culture NO GROWTH  Final   Report Status 11/22/2016 FINAL  Final          Radiology Studies: Ct Abdomen Pelvis Wo Contrast  Result Date: 11/21/2016 CLINICAL DATA:  Fever. EXAM: CT CHEST, ABDOMEN AND PELVIS WITHOUT CONTRAST TECHNIQUE: Multidetector CT imaging of the chest, abdomen and pelvis was performed following the standard protocol without IV contrast. COMPARISON:  09/20/2008 abdominal CT FINDINGS: CT CHEST FINDINGS Cardiovascular: No cardiomegaly or pericardial effusion. Atherosclerosis, including the coronary arteries. Prominent aortic valvular/annular calcification. Negative esophagus. Mediastinum/Nodes: Negative for mass or adenopathy. Lungs/Pleura: Trace layering pleural effusions. 5 mm pulmonary nodule below the right minor fissure has a flat morphology most consistent with lymph node. No suspicious pulmonary nodule. Mild atelectasis at the bases. Musculoskeletal: No acute or aggressive finding CT ABDOMEN PELVIS FINDINGS Hepatobiliary: Small scattered cystic density structures in the liver. No concerning change since since 2009.Cholelithiasis. No evidence of acute cholecystitis. Normal common bile duct diameter. Pancreas: Unremarkable. Spleen: Unremarkable. Adrenals/Urinary Tract: Negative adrenals. No hydronephrosis or ureteral calculus. 2 left renal calculi measuring up to 4 mm in the upper pole. Unremarkable bladder. Stomach/Bowel: Formed stool throughout the colon. No small bowel obstruction or inflammation. Presacral edema may be related to rectal distention (although only moderate) or volume overload. There is subcutaneous edema about the bilateral hips. Negative appendix. Colonic diverticulosis. Vascular/Lymphatic: No acute vascular abnormality. IVC filter. Aortic atherosclerosis. No mass or adenopathy. Reproductive:No pathologic findings. Other: No ascites or pneumoperitoneum. Left inguinal hernia containing nonobstructed sigmoid colon. Musculoskeletal: There is asymmetric fat edema around the upper thigh and lateral gluteal musculature on the right  IMPRESSION: 1. No definite explanation for fever. 2. Asymmetric fat edema around the right upper femur which could be posttraumatic or inflammatory. Negative for hip effusion or acute osseous finding. 3. Trace pleural effusions and mild atelectasis. 4. Cholelithiasis. 5. Nonobstructive left nephrolithiasis. 6. Left inguinal hernia containing nonobstructed colon. 7. Moderate stool volume. Electronically Signed   By: Monte Fantasia M.D.   On: 11/21/2016 13:18   Ct Chest Wo Contrast  Result Date: 11/21/2016 CLINICAL DATA:  Fever. EXAM: CT CHEST, ABDOMEN AND PELVIS WITHOUT CONTRAST TECHNIQUE: Multidetector CT imaging of the chest, abdomen and pelvis was performed following the standard protocol without IV contrast. COMPARISON:  09/20/2008 abdominal CT FINDINGS: CT CHEST FINDINGS Cardiovascular: No cardiomegaly or pericardial effusion. Atherosclerosis, including the coronary arteries. Prominent aortic valvular/annular calcification. Negative esophagus. Mediastinum/Nodes: Negative for mass or adenopathy. Lungs/Pleura: Trace layering pleural effusions. 5 mm pulmonary nodule below the right minor fissure has a flat morphology most consistent with lymph node. No suspicious pulmonary nodule. Mild atelectasis at the bases. Musculoskeletal: No acute or aggressive finding CT ABDOMEN PELVIS FINDINGS Hepatobiliary: Small scattered cystic density structures in the liver. No concerning change since since 2009.Cholelithiasis. No evidence of acute cholecystitis. Normal common bile duct diameter. Pancreas: Unremarkable. Spleen: Unremarkable. Adrenals/Urinary Tract: Negative adrenals. No hydronephrosis or ureteral calculus. 2 left renal calculi measuring up to 4 mm in the upper pole. Unremarkable bladder. Stomach/Bowel: Formed stool throughout the colon. No small bowel obstruction or inflammation. Presacral edema may be related to rectal distention (although only moderate) or volume overload. There is subcutaneous edema about the  bilateral hips. Negative appendix. Colonic diverticulosis. Vascular/Lymphatic: No acute vascular abnormality. IVC filter. Aortic atherosclerosis. No mass or adenopathy. Reproductive:No pathologic findings. Other: No ascites or pneumoperitoneum. Left inguinal hernia containing nonobstructed sigmoid colon. Musculoskeletal: There is asymmetric fat edema around the upper thigh  and lateral gluteal musculature on the right IMPRESSION: 1. No definite explanation for fever. 2. Asymmetric fat edema around the right upper femur which could be posttraumatic or inflammatory. Negative for hip effusion or acute osseous finding. 3. Trace pleural effusions and mild atelectasis. 4. Cholelithiasis. 5. Nonobstructive left nephrolithiasis. 6. Left inguinal hernia containing nonobstructed colon. 7. Moderate stool volume. Electronically Signed   By: Monte Fantasia M.D.   On: 11/21/2016 13:18   US Abdomen Limited Ruq  Result Date: 11/20/2016 CLINICAL DATA:  Elevated liver function studies. History of hypertension, GE reflux disease, dyslipidemia, liver cysts. EXAM: US ABDOMEN LIMITED - RIGHT UPPER QUADRANT COMPARISON:  CT abdomen and pelvis 09/20/2008 FINDINGS: Gallbladder: Small amount of sludge layering in the gallbladder. No stones identified. No gallbladder wall thickening. Murphy's sign is negative. Common bile duct: Diameter: 3.2 mm, normal Liver: Liver parenchymal echotexture is diffusely increased suggesting fatty infiltration. Cystic lesions seen on prior CT are not well identified at ultrasound. Examination is limited due to patient's body habitus and inability to reposition the patient. IMPRESSION: Gallbladder sludge. No stones. No additional changes to suggest cholecystitis. Probable fatty infiltration of the liver. Electronically Signed   By: Lucienne Capers M.D.   On: 11/20/2016 22:07        Scheduled Meds: . enoxaparin (LOVENOX) injection  40 mg Subcutaneous Q24H  . fluticasone  2 spray Each Nare Daily  .  guaiFENesin  600 mg Oral BID  . mirabegron ER  50 mg Oral Daily  . multivitamin with minerals  1 tablet Oral Daily  . pantoprazole  40 mg Oral Daily  . QUEtiapine  25 mg Oral BID  . senna-docusate  2 tablet Oral BID  . simvastatin  20 mg Oral q1800  . sodium chloride flush  3 mL Intravenous Q12H   Continuous Infusions:    LOS: 6 days    Time spent: > 35 minutes  Kymberley Raz, MD PhD [facility] Pager [phone]  If 7PM-7AM, please contact night-coverage [email or web address] Password [email or web address] 11/22/2016, 2:15 PM

## 2016-11-23 DIAGNOSIS — R945 Abnormal results of liver function studies: Secondary | ICD-10-CM

## 2016-11-23 DIAGNOSIS — R7989 Other specified abnormal findings of blood chemistry: Secondary | ICD-10-CM

## 2016-11-23 DIAGNOSIS — G9349 Other encephalopathy: Secondary | ICD-10-CM

## 2016-11-23 DIAGNOSIS — D500 Iron deficiency anemia secondary to blood loss (chronic): Secondary | ICD-10-CM

## 2016-11-23 LAB — BASIC METABOLIC PANEL
ANION GAP: 10 (ref 5–15)
BUN: 25 mg/dL — ABNORMAL HIGH (ref 6–20)
CHLORIDE: 96 mmol/L — AB (ref 101–111)
CO2: 29 mmol/L (ref 22–32)
CREATININE: 1.56 mg/dL — AB (ref 0.61–1.24)
Calcium: 9.8 mg/dL (ref 8.9–10.3)
GFR calc non Af Amer: 41 mL/min — ABNORMAL LOW (ref >60)
GFR, EST AFRICAN AMERICAN: 48 mL/min — AB (ref >60)
Glucose, Bld: 110 mg/dL — ABNORMAL HIGH (ref 65–99)
POTASSIUM: 3.6 mmol/L (ref 3.5–5.1)
SODIUM: 135 mmol/L (ref 135–145)

## 2016-11-23 LAB — CBC
HEMATOCRIT: 29.7 % — AB (ref 39.0–52.0)
HEMOGLOBIN: 9.9 g/dL — AB (ref 13.0–17.0)
MCH: 30.2 pg (ref 26.0–34.0)
MCHC: 33.3 g/dL (ref 30.0–36.0)
MCV: 90.5 fL (ref 78.0–100.0)
Platelets: 472 10*3/uL — ABNORMAL HIGH (ref 150–400)
RBC: 3.28 MIL/uL — AB (ref 4.22–5.81)
RDW: 14.7 % (ref 11.5–15.5)
WBC: 12.4 10*3/uL — AB (ref 4.0–10.5)

## 2016-11-23 LAB — RHEUMATOID FACTOR: Rhuematoid fact SerPl-aCnc: 16.9 IU/mL — ABNORMAL HIGH (ref 0.0–13.9)

## 2016-11-23 NOTE — Care Management Note (Signed)
Case Management Note  Patient Details  Name: SLATER GETTLER MRN: [id number] Date of Birth: [birth year]  Subjective/Objective: 76 y.o. M admitted 11/16/2016 with progressive confusion and poor po intake. PT evaluation recommending SNF and MD note reflects family (daughter) in agreement. Made referral to CSW.                    Action/Plan:CM will sign off for now but will be available should additional discharge needs arise or disposition change.    Expected Discharge Date:                  Expected Discharge Plan:  [place]  In-House Referral:  Clinical Social Work  Discharge planning Services  CM Consult  Post Acute Care Choice:  NA Choice offered to:  Patient  DME Arranged:    DME Agency:     HH Arranged:    Advance Agency:     Status of Service:  In process, will continue to follow  If discussed at Long Length of Stay Meetings, dates discussed:    Additional Comments:  Delrae Sawyers, RN 11/23/2016, 11:44 AM

## 2016-11-23 NOTE — Progress Notes (Signed)
Patient ID: Jeff Rivera, male   DOB: 1941/09/15, 76 y.o.   MRN: [id number]          [facility]  Date of Admission:  11/16/2016     Principal Problem:   Fever Active Problems:   DVT (deep venous thrombosis) (HCC)   GI bleed   S/P IVC filter   CAD (coronary artery disease)   Aortic stenosis   Dyslipidemia   Anemia   Acute blood loss anemia   CRI (chronic renal insufficiency)   Degenerative joint disease   Status post left knee replacement   . enoxaparin (LOVENOX) injection  40 mg Subcutaneous Q24H  . fluticasone  2 spray Each Nare Daily  . guaiFENesin  600 mg Oral BID  . mirabegron ER  50 mg Oral Daily  . multivitamin with minerals  1 tablet Oral Daily  . pantoprazole  40 mg Oral Daily  . QUEtiapine  25 mg Oral BID  . senna-docusate  2 tablet Oral BID  . simvastatin  20 mg Oral q1800  . sodium chloride flush  3 mL Intravenous Q12H    SUBJECTIVE: He is asking when he can go home.  Review of Systems: Review of Systems  Constitutional: Negative for chills, diaphoresis, fever, malaise/fatigue and weight loss.  HENT: Negative for sore throat.   Respiratory: Negative for cough, sputum production and shortness of breath.   Cardiovascular: Negative for chest pain.  Gastrointestinal: Positive for constipation. Negative for abdominal pain, diarrhea, heartburn, nausea and vomiting.  Genitourinary: Negative for dysuria and frequency.  Musculoskeletal: Negative for joint pain and myalgias.  Skin: Negative for rash.  Neurological: Negative for dizziness and headaches.    Past Medical History:  Diagnosis Date  . Aortic stenosis    mild   . Arthritis    OA  . CAD (coronary artery disease)   . Chronic kidney disease    creatine 1.5 was 1.5 09/2015  . Complication of anesthesia    patient was confused for 6-8 days.  doesnt remember anything from time he registered several days after he was admitted to [facility]  . Dyslipidemia   . ED (erectile  dysfunction)   . GERD (gastroesophageal reflux disease)   . Heart murmur   . History of kidney stones   . Hypertension     Social History  Substance Use Topics  . Smoking status: Former Smoker    Years: 4.00    Types: Cigarettes    Quit date: 09/05/1961  . Smokeless tobacco: Never Used  . Alcohol use 2.4 oz/week    1 Standard drinks or equivalent, 3 Glasses of wine per week     Comment: 3-4 GLASSES WINE PER WEEK    Family History  Problem Relation Age of Onset  . Heart attack Jeff Rivera   . Coronary artery disease Jeff Rivera    No Known Allergies  OBJECTIVE: Vitals:   11/22/16 0452 11/22/16 1506 11/22/16 2133 11/23/16 0447  BP: (!) 164/83 (!) 100/50 (!) 158/64 140/68  Pulse: (!) 102 (!) 109 (!) 111 99  Resp: 17 20 18 18   Temp: 98.7 F (37.1 C) 98.2 F (36.8 C) 99.4 F (37.4 C) 98.2 F (36.8 C)  TempSrc: Oral Oral Oral Oral  SpO2:  97% 95% 95%  Weight:    165 lb (74.8 kg)  Height:       Body mass index is 25.46 kg/m.  Physical Exam  Constitutional: He is oriented to person, place, and time.  He is sitting  up in bed watching television.  HENT:  Mouth/Throat: No oropharyngeal exudate.  Eyes: Conjunctivae are normal.  Cardiovascular: Normal rate and regular rhythm.   No murmur heard. Pulmonary/Chest: Effort normal and breath sounds normal. He has no wheezes. He has no rales.  Abdominal: Soft. There is no tenderness.  Musculoskeletal: Normal range of motion. He exhibits no edema or tenderness.  Healed incisions over both knees.  Neurological: He is alert and oriented to person, place, and time.  Skin: No rash noted.  Psychiatric: Mood and affect normal.    Lab Results Lab Results  Component Value Date   WBC 12.4 (H) 11/23/2016   HGB 9.9 (L) 11/23/2016   HCT 29.7 (L) 11/23/2016   MCV 90.5 11/23/2016   PLT 472 (H) 11/23/2016    Lab Results  Component Value Date   CREATININE 1.56 (H) 11/23/2016   BUN 25 (H) 11/23/2016   NA 135 11/23/2016   K 3.6 11/23/2016     CL 96 (L) 11/23/2016   CO2 29 11/23/2016    Lab Results  Component Value Date   ALT 53 11/22/2016   AST 38 11/22/2016   ALKPHOS 54 11/22/2016   BILITOT 0.8 11/22/2016     Microbiology: Recent Results (from the past 240 hour(s))  Blood culture (routine x 2)     Status: None   Collection Time: 11/16/16  4:56 PM  Result Value Ref Range Status   Specimen Description BLOOD RIGHT WRIST  Final   Special Requests BOTTLES DRAWN AEROBIC AND ANAEROBIC 5CC  Final   Culture NO GROWTH 5 DAYS  Final   Report Status 11/21/2016 FINAL  Final  Blood culture (routine x 2)     Status: None   Collection Time: 11/16/16  5:00 PM  Result Value Ref Range Status   Specimen Description BLOOD LEFT WRIST  Final   Special Requests AEROBIC BOTTLE ONLY 10CC  Final   Culture NO GROWTH 5 DAYS  Final   Report Status 11/21/2016 FINAL  Final  Respiratory Panel by PCR     Status: None   Collection Time: 11/16/16  7:47 PM  Result Value Ref Range Status   Adenovirus NOT DETECTED NOT DETECTED Final   Coronavirus 229E NOT DETECTED NOT DETECTED Final   Coronavirus HKU1 NOT DETECTED NOT DETECTED Final   Coronavirus NL63 NOT DETECTED NOT DETECTED Final   Coronavirus OC43 NOT DETECTED NOT DETECTED Final   Metapneumovirus NOT DETECTED NOT DETECTED Final   Rhinovirus / Enterovirus NOT DETECTED NOT DETECTED Final   Influenza A NOT DETECTED NOT DETECTED Final   Influenza B NOT DETECTED NOT DETECTED Final   Parainfluenza Virus 1 NOT DETECTED NOT DETECTED Final   Parainfluenza Virus 2 NOT DETECTED NOT DETECTED Final   Parainfluenza Virus 3 NOT DETECTED NOT DETECTED Final   Parainfluenza Virus 4 NOT DETECTED NOT DETECTED Final   Respiratory Syncytial Virus NOT DETECTED NOT DETECTED Final   Bordetella pertussis NOT DETECTED NOT DETECTED Final   Chlamydophila pneumoniae NOT DETECTED NOT DETECTED Final   Mycoplasma pneumoniae NOT DETECTED NOT DETECTED Final  MRSA PCR Screening     Status: None   Collection Time: 11/19/16   9:04 PM  Result Value Ref Range Status   MRSA by PCR NEGATIVE NEGATIVE Final    Comment:        The GeneXpert MRSA Assay (FDA approved for NASAL specimens only), is one component of a comprehensive MRSA colonization surveillance program. It is not intended to diagnose MRSA infection nor to guide  or monitor treatment for MRSA infections.   Culture, blood (routine x 2)     Status: None (Preliminary result)   Collection Time: 11/21/16  9:05 AM  Result Value Ref Range Status   Specimen Description BLOOD RIGHT HAND  Final   Special Requests IN PEDIATRIC BOTTLE 2CC  Final   Culture NO GROWTH 1 DAY  Final   Report Status PENDING  Incomplete  Culture, blood (routine x 2)     Status: None (Preliminary result)   Collection Time: 11/21/16  9:05 AM  Result Value Ref Range Status   Specimen Description BLOOD LEFT ANTECUBITAL  Final   Special Requests IN PEDIATRIC BOTTLE 2CC  Final   Culture NO GROWTH 1 DAY  Final   Report Status PENDING  Incomplete  Urine culture     Status: None   Collection Time: 11/21/16 12:52 PM  Result Value Ref Range Status   Specimen Description URINE, RANDOM  Final   Special Requests NONE  Final   Culture NO GROWTH  Final   Report Status 11/22/2016 FINAL  Final     ASSESSMENT: He has had some intermittent low-grade fevers since he had his IVC filter placed. There is no evidence of any active infection and I would recommend continuing observation off of antibiotics for now.   PLAN: Continue observation off of antibiotics  Michel Bickers, MD [facility] [place] [phone] pager   [phone] cell 11/23/2016, 1:21 PM

## 2016-11-23 NOTE — Progress Notes (Signed)
PROGRESS NOTE    Jeff Rivera  WUJ:[id number] DOB: 08-Oct-1941 DOA: 11/16/2016 PCP: Thressa Sheller, MD    Brief Narrative:   76 y.o. with known hx of HTN, CKD stage III, chronic GI bleed, recent dx of DVT back in December when he was initially placed on Xarelto but subsequently developed GI bleed, just discharged 2/3 (was hospitalized from 2/2 - 2/3 for IVC filter placement), now presented from home by his son for evaluation of progressively worsening confusion, poor oral intake since being discharged   Assessment & Plan:   Metabolic encephalophaty/progressive confusion (presenting symptom) Likely multifactorial, from infection? Family also report patent has h/o encephalopathy with anesthesia in the past CT head showed normal pressure hydrocephalus, no acute findings. ( mri brain in 2014 with similar findings) RPR /HIV negative, b12/ folate unremarkable, tsh wnl, ammonia 16,  Need outpatient follow up with neurology for normal presure hydrocephalus, I have explained this to his daughter, she expressed understanding Patient has intermittent agitation, delirium, family report patient at baseline no confusion and independent. sitter in room , trial of seroquel.  Less agitation, still confused, not oriented to time   HCAP (healthcare-associated pneumonia)/sepsis presented on admission CXR left lower lobe infiltrate, he presented with confusion, leukocytosis wbc14.2, fever 102 Respiratory viral penal negative, sputum culture not collected, blood culture no growth, mrsa screening negative, urine strep pneumonia antigen negative  he is treated with vanc/zosyn since admission, with clinical improvement, wbc has normalized, less confusion, change abx to oral doxycycline spike another fever 100.8 on 2/9, 101 on 2/10, CT chest/ab/pel no clear source of infection, repeat UA unremarkable, repeat blood culture no growth, Will check ana/anca/rf  infectious disease consulted   LFT elevation, with  normal bili and alk phos, hepatitis panel, liver US gallbladder sludge, fatty liver  from sepsis? CK mildly elevated,  Both ck and lft now normalized, may be mild rhabdo   CKD III:  cr 1.3-1.6 close to baseline ua no infection Renal dosing meds  Acute deep vein thrombosis (DVT) of femoral vein of right lower extremity ([place]) , he was hospitalized for this from  2/2 to 2/3 - IVC filter placed secondary to GI bleed. xarelto held - Pain control an issue, family report patient is not able to tolerate any narcotic ( all cause confusion), except ultram, he is on prn ultram    Bilateral lower extremity edema: right > left, trial of lasix if bp allows Edema has improved  Chronic Anemia: normocytic With h/o gi bleed, off anticoagulation, off asa Anemia work up Family report recent egd by Dr Watt Climes unremarkable, planned to proceed with colonoscopy outpatient.    CAD (coronary artery disease) - continue statin, asa discontinued from recent hospitalization due to gi bleed - no chest pain reported    Dyslipidemia  - stable on statin. May need t od/c statin due to elevated ck,    Small pleural effusion, lower extremity edema, resume lasix on 2/9, monitor effect, monitor cr  FTT: PT/OT/SNF   DVT prophylaxis: Lovenox Code Status: Full Family Communication: patient , his wife and son at bedside Disposition Plan:  SNF when fever free   Consultants:   Infectious disease   Procedures: none   Antimicrobials: vancomycin, zosyn from admission to 2/8  Doxycycline from 2/8 to 2/10   Subjective:  intermittent confusion, less agitated Spiking fever No diarrhea, no skin issues Family at bedside  Objective: Vitals:   11/22/16 0452 11/22/16 1506 11/22/16 2133 11/23/16 0447  BP: (!) 164/83 (!) 100/50 (!) 158/64  140/68  Pulse: (!) 102 (!) 109 (!) 111 99  Resp: _0 Temp: 98.7 F (37.1 C) 98.2 F (36.8 C) 99.4 F (37.4 C) 98.2 F (36.8 C)  TempSrc: Oral Oral Oral Oral    SpO2:  97% 95% 95%  Weight:    74.8 kg (165 lb)  Height:        Intake/Output Summary (Last 24 hours) at 11/23/16 1049 Last data filed at 11/23/16 9244  Gross per 24 hour  Intake              250 ml  Output                0 ml  Net              250 ml   Filed Weights   11/21/16 0327 11/22/16 0201 11/23/16 0447  Weight: 72.6 kg (160 lb) 75.1 kg (165 lb 9.6 oz) 74.8 kg (165 lb)    Examination:  General exam: aaox3 this am Respiratory system: Clear to auscultation. Respiratory effort normal. Equal chest rise. Decreased breath sounds of left base Cardiovascular system: S1 & S2 heard, RRR. No JVD, murmurs, rubs, gallops or clicks. No pedal edema. Gastrointestinal system: Abdomen is nondistended, soft and nontender. No organomegaly or masses felt. Normal bowel sounds heard. Central nervous system: Alert and Awake, No focal neurological deficits. Extremities: Symmetric 5 x 5 power. Edema right >left lower extremity Skin: No rashes, lesions or ulcers on limited exam, on limited exam. Musculoskeletal: Neck pain with palpation Psychiatry:Mood & affect appropriate.     Data Reviewed: I have personally reviewed following labs and imaging studies  CBC:  Recent Labs Lab 11/19/16 0018 11/20/16 0436 11/21/16 0450 11/22/16 1003 11/23/16 0506  WBC 10.8* 8.2 8.4 10.9* 12.4*  NEUTROABS  --  6.1  --  9.0*  --   HGB 8.3* 8.6* 8.8* 10.6* 9.9*  HCT 24.7* 26.1* 27.0* 31.8* 29.7*  MCV 92.2 92.6 91.5 91.4 90.5  PLT 262 297 341 470* 628*   Basic Metabolic Panel:  Recent Labs Lab 11/19/16 0018 11/20/16 0436 11/21/16 0450 11/22/16 1003 11/23/16 0506  NA 137 141 137 138 135  K 3.9 3.7 3.8 3.8 3.6  CL 105 107 102 97* 96*  CO2 _1 GLUCOSE 118* 111* 100* 110* 110*  BUN _2 25*  CREATININE 1.60* 1.39* 1.46* 1.40* 1.56*  CALCIUM 8.8* 9.1 9.7 10.4* 9.8   GFR: Estimated Creatinine Clearance: 37.7 mL/min (by C-G formula based on SCr of 1.56 mg/dL (H)). Liver  Function Tests:  Recent Labs Lab 11/20/16 0436 11/21/16 0450 11/22/16 1003  AST 61* 45* 38  ALT 65* 54 53  ALKPHOS 46 48 54  BILITOT 0.6 0.5 0.8  PROT 5.5* 5.5* 6.6  ALBUMIN 2.2* 2.3* 2.8*   No results for input(s): LIPASE, AMYLASE in the last 168 hours.  Recent Labs Lab 11/21/16 0450  AMMONIA 16   Coagulation Profile:  Recent Labs Lab 11/16/16 2150  INR 1.34   Cardiac Enzymes:  Recent Labs Lab 11/21/16 0450 11/22/16 1003  CKTOTAL 489* 179   BNP (last 3 results) No results for input(s): PROBNP in the last 8760 hours. HbA1C: No results for input(s): HGBA1C in the last 72 hours. CBG: No results for input(s): GLUCAP in the last 168 hours. Lipid Profile:  Recent Labs  11/21/16 0450  CHOL 173  HDL 30*  LDLCALC 127*  TRIG 81  CHOLHDL 5.8   Thyroid  Function Tests:  Recent Labs  11/21/16 0450  TSH 1.891   Anemia Panel:  Recent Labs  11/20/16 1230 11/21/16 0450  VITAMINB12  --  1,817*  FOLATE  --  22.1  TIBC  --  209*  IRON  --  12*  RETICCTPCT 1.9  --    Sepsis Labs:  Recent Labs Lab 11/16/16 1719 11/16/16 2150 11/17/16 0027  PROCALCITON  --  0.25  --   LATICACIDVEN 1.21 0.9 0.8    Recent Results (from the past 240 hour(s))  Blood culture (routine x 2)     Status: None   Collection Time: 11/16/16  4:56 PM  Result Value Ref Range Status   Specimen Description BLOOD RIGHT WRIST  Final   Special Requests BOTTLES DRAWN AEROBIC AND ANAEROBIC 5CC  Final   Culture NO GROWTH 5 DAYS  Final   Report Status 11/21/2016 FINAL  Final  Blood culture (routine x 2)     Status: None   Collection Time: 11/16/16  5:00 PM  Result Value Ref Range Status   Specimen Description BLOOD LEFT WRIST  Final   Special Requests AEROBIC BOTTLE ONLY 10CC  Final   Culture NO GROWTH 5 DAYS  Final   Report Status 11/21/2016 FINAL  Final  Respiratory Panel by PCR     Status: None   Collection Time: 11/16/16  7:47 PM  Result Value Ref Range Status   Adenovirus  NOT DETECTED NOT DETECTED Final   Coronavirus 229E NOT DETECTED NOT DETECTED Final   Coronavirus HKU1 NOT DETECTED NOT DETECTED Final   Coronavirus NL63 NOT DETECTED NOT DETECTED Final   Coronavirus OC43 NOT DETECTED NOT DETECTED Final   Metapneumovirus NOT DETECTED NOT DETECTED Final   Rhinovirus / Enterovirus NOT DETECTED NOT DETECTED Final   Influenza A NOT DETECTED NOT DETECTED Final   Influenza B NOT DETECTED NOT DETECTED Final   Parainfluenza Virus 1 NOT DETECTED NOT DETECTED Final   Parainfluenza Virus 2 NOT DETECTED NOT DETECTED Final   Parainfluenza Virus 3 NOT DETECTED NOT DETECTED Final   Parainfluenza Virus 4 NOT DETECTED NOT DETECTED Final   Respiratory Syncytial Virus NOT DETECTED NOT DETECTED Final   Bordetella pertussis NOT DETECTED NOT DETECTED Final   Chlamydophila pneumoniae NOT DETECTED NOT DETECTED Final   Mycoplasma pneumoniae NOT DETECTED NOT DETECTED Final  MRSA PCR Screening     Status: None   Collection Time: 11/19/16  9:04 PM  Result Value Ref Range Status   MRSA by PCR NEGATIVE NEGATIVE Final    Comment:        The GeneXpert MRSA Assay (FDA approved for NASAL specimens only), is one component of a comprehensive MRSA colonization surveillance program. It is not intended to diagnose MRSA infection nor to guide or monitor treatment for MRSA infections.   Culture, blood (routine x 2)     Status: None (Preliminary result)   Collection Time: 11/21/16  9:05 AM  Result Value Ref Range Status   Specimen Description BLOOD RIGHT HAND  Final   Special Requests IN PEDIATRIC BOTTLE 2CC  Final   Culture NO GROWTH 1 DAY  Final   Report Status PENDING  Incomplete  Culture, blood (routine x 2)     Status: None (Preliminary result)   Collection Time: 11/21/16  9:05 AM  Result Value Ref Range Status   Specimen Description BLOOD LEFT ANTECUBITAL  Final   Special Requests IN PEDIATRIC BOTTLE 2CC  Final   Culture NO GROWTH 1 DAY  Final  Report Status PENDING   Incomplete  Urine culture     Status: None   Collection Time: 11/21/16 12:52 PM  Result Value Ref Range Status   Specimen Description URINE, RANDOM  Final   Special Requests NONE  Final   Culture NO GROWTH  Final   Report Status 11/22/2016 FINAL  Final         Radiology Studies: Ct Abdomen Pelvis Wo Contrast  Result Date: 11/21/2016 CLINICAL DATA:  Fever. EXAM: CT CHEST, ABDOMEN AND PELVIS WITHOUT CONTRAST TECHNIQUE: Multidetector CT imaging of the chest, abdomen and pelvis was performed following the standard protocol without IV contrast. COMPARISON:  09/20/2008 abdominal CT FINDINGS: CT CHEST FINDINGS Cardiovascular: No cardiomegaly or pericardial effusion. Atherosclerosis, including the coronary arteries. Prominent aortic valvular/annular calcification. Negative esophagus. Mediastinum/Nodes: Negative for mass or adenopathy. Lungs/Pleura: Trace layering pleural effusions. 5 mm pulmonary nodule below the right minor fissure has a flat morphology most consistent with lymph node. No suspicious pulmonary nodule. Mild atelectasis at the bases. Musculoskeletal: No acute or aggressive finding CT ABDOMEN PELVIS FINDINGS Hepatobiliary: Small scattered cystic density structures in the liver. No concerning change since since 2009.Cholelithiasis. No evidence of acute cholecystitis. Normal common bile duct diameter. Pancreas: Unremarkable. Spleen: Unremarkable. Adrenals/Urinary Tract: Negative adrenals. No hydronephrosis or ureteral calculus. 2 left renal calculi measuring up to 4 mm in the upper pole. Unremarkable bladder. Stomach/Bowel: Formed stool throughout the colon. No small bowel obstruction or inflammation. Presacral edema may be related to rectal distention (although only moderate) or volume overload. There is subcutaneous edema about the bilateral hips. Negative appendix. Colonic diverticulosis. Vascular/Lymphatic: No acute vascular abnormality. IVC filter. Aortic atherosclerosis. No mass or  adenopathy. Reproductive:No pathologic findings. Other: No ascites or pneumoperitoneum. Left inguinal hernia containing nonobstructed sigmoid colon. Musculoskeletal: There is asymmetric fat edema around the upper thigh and lateral gluteal musculature on the right IMPRESSION: 1. No definite explanation for fever. 2. Asymmetric fat edema around the right upper femur which could be posttraumatic or inflammatory. Negative for hip effusion or acute osseous finding. 3. Trace pleural effusions and mild atelectasis. 4. Cholelithiasis. 5. Nonobstructive left nephrolithiasis. 6. Left inguinal hernia containing nonobstructed colon. 7. Moderate stool volume. Electronically Signed   By: Monte Fantasia M.D.   On: 11/21/2016 13:18   Ct Chest Wo Contrast  Result Date: 11/21/2016 CLINICAL DATA:  Fever. EXAM: CT CHEST, ABDOMEN AND PELVIS WITHOUT CONTRAST TECHNIQUE: Multidetector CT imaging of the chest, abdomen and pelvis was performed following the standard protocol without IV contrast. COMPARISON:  09/20/2008 abdominal CT FINDINGS: CT CHEST FINDINGS Cardiovascular: No cardiomegaly or pericardial effusion. Atherosclerosis, including the coronary arteries. Prominent aortic valvular/annular calcification. Negative esophagus. Mediastinum/Nodes: Negative for mass or adenopathy. Lungs/Pleura: Trace layering pleural effusions. 5 mm pulmonary nodule below the right minor fissure has a flat morphology most consistent with lymph node. No suspicious pulmonary nodule. Mild atelectasis at the bases. Musculoskeletal: No acute or aggressive finding CT ABDOMEN PELVIS FINDINGS Hepatobiliary: Small scattered cystic density structures in the liver. No concerning change since since 2009.Cholelithiasis. No evidence of acute cholecystitis. Normal common bile duct diameter. Pancreas: Unremarkable. Spleen: Unremarkable. Adrenals/Urinary Tract: Negative adrenals. No hydronephrosis or ureteral calculus. 2 left renal calculi measuring up to 4 mm in the  upper pole. Unremarkable bladder. Stomach/Bowel: Formed stool throughout the colon. No small bowel obstruction or inflammation. Presacral edema may be related to rectal distention (although only moderate) or volume overload. There is subcutaneous edema about the bilateral hips. Negative appendix. Colonic diverticulosis. Vascular/Lymphatic: No acute vascular abnormality. IVC  filter. Aortic atherosclerosis. No mass or adenopathy. Reproductive:No pathologic findings. Other: No ascites or pneumoperitoneum. Left inguinal hernia containing nonobstructed sigmoid colon. Musculoskeletal: There is asymmetric fat edema around the upper thigh and lateral gluteal musculature on the right IMPRESSION: 1. No definite explanation for fever. 2. Asymmetric fat edema around the right upper femur which could be posttraumatic or inflammatory. Negative for hip effusion or acute osseous finding. 3. Trace pleural effusions and mild atelectasis. 4. Cholelithiasis. 5. Nonobstructive left nephrolithiasis. 6. Left inguinal hernia containing nonobstructed colon. 7. Moderate stool volume. Electronically Signed   By: Monte Fantasia M.D.   On: 11/21/2016 13:18        Scheduled Meds: . enoxaparin (LOVENOX) injection  40 mg Subcutaneous Q24H  . fluticasone  2 spray Each Nare Daily  . guaiFENesin  600 mg Oral BID  . mirabegron ER  50 mg Oral Daily  . multivitamin with minerals  1 tablet Oral Daily  . pantoprazole  40 mg Oral Daily  . QUEtiapine  25 mg Oral BID  . senna-docusate  2 tablet Oral BID  . simvastatin  20 mg Oral q1800  . sodium chloride flush  3 mL Intravenous Q12H   Continuous Infusions:    LOS: 7 days    Time spent: > 35 minutes  Zariel Capano, MD PhD [facility] Pager [phone]  If 7PM-7AM, please contact night-coverage [email or web address] Password [facility] 11/23/2016, 10:49 AM

## 2016-11-24 ENCOUNTER — Inpatient Hospital Stay (HOSPITAL_COMMUNITY): Payer: Medicare Other

## 2016-11-24 LAB — BASIC METABOLIC PANEL
ANION GAP: 9 (ref 5–15)
BUN: 29 mg/dL — ABNORMAL HIGH (ref 6–20)
CALCIUM: 9.9 mg/dL (ref 8.9–10.3)
CO2: 29 mmol/L (ref 22–32)
Chloride: 97 mmol/L — ABNORMAL LOW (ref 101–111)
Creatinine, Ser: 1.54 mg/dL — ABNORMAL HIGH (ref 0.61–1.24)
GFR, EST AFRICAN AMERICAN: 48 mL/min — AB (ref >60)
GFR, EST NON AFRICAN AMERICAN: 42 mL/min — AB (ref >60)
Glucose, Bld: 192 mg/dL — ABNORMAL HIGH (ref 65–99)
POTASSIUM: 4 mmol/L (ref 3.5–5.1)
Sodium: 135 mmol/L (ref 135–145)

## 2016-11-24 LAB — CBC
HEMATOCRIT: 30.2 % — AB (ref 39.0–52.0)
Hemoglobin: 9.9 g/dL — ABNORMAL LOW (ref 13.0–17.0)
MCH: 30 pg (ref 26.0–34.0)
MCHC: 32.8 g/dL (ref 30.0–36.0)
MCV: 91.5 fL (ref 78.0–100.0)
Platelets: 569 10*3/uL — ABNORMAL HIGH (ref 150–400)
RBC: 3.3 MIL/uL — AB (ref 4.22–5.81)
RDW: 14.7 % (ref 11.5–15.5)
WBC: 13.8 10*3/uL — AB (ref 4.0–10.5)

## 2016-11-24 LAB — ANTINUCLEAR ANTIBODIES, IFA: ANTINUCLEAR ANTIBODIES, IFA: NEGATIVE

## 2016-11-24 MED ORDER — ENSURE ENLIVE PO LIQD
237.0000 mL | Freq: Three times a day (TID) | ORAL | Status: DC
Start: 2016-11-24 — End: 2016-11-25
  Administered 2016-11-24 – 2016-11-25 (×2): 237 mL via ORAL

## 2016-11-24 MED ORDER — IOPAMIDOL (ISOVUE-300) INJECTION 61%
INTRAVENOUS | Status: AC
  Filled 2016-11-24: qty 75

## 2016-11-24 NOTE — Progress Notes (Signed)
PROGRESS NOTE    Jeff Rivera  JOA:[id number] DOB: 1941/03/30 DOA: 11/16/2016 PCP: Thressa Sheller, MD    Brief Narrative:   76 y.o. with known hx of HTN, CKD stage III, chronic GI bleed, recent dx of DVT back in December when he was initially placed on Xarelto but subsequently developed GI bleed, just discharged 2/3 (was hospitalized from 2/2 - 2/3 for IVC filter placement), now presented from home by his son for evaluation of progressively worsening confusion, poor oral intake since being discharged   Assessment & Plan:   Metabolic encephalophaty/progressive confusion (presenting symptom) Likely multifactorial, from infection? Family also report patent has h/o encephalopathy with anesthesia in the past CT head showed normal pressure hydrocephalus, no acute findings. ( mri brain in 2014 with similar findings) RPR /HIV negative, b12/ folate unremarkable, tsh wnl, ammonia 16,  Need outpatient follow up with neurology for normal presure hydrocephalus, I have explained this to his daughter, she expressed understanding Patient has intermittent agitation, delirium, family report patient at baseline no confusion and independent. sitter in room , trial of seroquel.  Less agitation, still confused, not oriented to time   HCAP (healthcare-associated pneumonia)/sepsis presented on admission CXR left lower lobe infiltrate, he presented with confusion, leukocytosis wbc14.2, fever 102 Respiratory viral penal negative, sputum culture not collected, blood culture no growth, mrsa screening negative, urine strep pneumonia antigen negative  he is treated with vanc/zosyn since admission, with clinical improvement, wbc has normalized, less confusion, change abx to oral doxycycline spike another fever 100.8 on 2/9, 101 on 2/10, CT chest/ab/pel no clear source of infection, repeat UA unremarkable, repeat blood culture no growth, Will check ana/anca/rf  infectious disease consulted who recommend observe off  abx.   LFT elevation, with normal bili and alk phos, hepatitis panel, liver US gallbladder sludge, fatty liver  from sepsis? CK mildly elevated,  Both ck and lft now normalized, may be mild rhabdo   CKD III:  cr 1.3-1.6 close to baseline ua no infection Renal dosing meds  Acute deep vein thrombosis (DVT) of femoral vein of right lower extremity ([place]) , he was hospitalized for this from  2/2 to 2/3 - IVC filter placed secondary to GI bleed. xarelto held - Pain control an issue, family report patient is not able to tolerate any narcotic ( all cause confusion), except ultram, he is on prn ultram    Bilateral lower extremity edema: right > left,small pleural effusion, trial of lasix if bp allows Edema has improved  Chronic Anemia: normocytic With h/o gi bleed, off anticoagulation, off asa Anemia work up Family report recent egd by Dr Watt Climes unremarkable, planned to proceed with colonoscopy outpatient.    CAD (coronary artery disease) - continue statin, asa discontinued from recent hospitalization due to gi bleed - no chest pain reported    Dyslipidemia  - stable on statin. May need t od/c statin due to elevated ck,   Right shoulder pain and neck pain, ct soft tissue neck and right shoulder x ray  FTT: PT/OT/SNF   DVT prophylaxis: Lovenox Code Status: Full Family Communication: patient ,  Disposition Plan:  SNF when fever free, once ct soft tissue neck and right shoulder x ray resulted   Consultants:   Infectious disease   Procedures: none   Antimicrobials: vancomycin, zosyn from admission to 2/8  Doxycycline from 2/8 to 2/10   Subjective:  intermittent confusion, less agitated no fever last 24hrs No diarrhea, no skin issues   Objective: Vitals:   11/23/16 2149 11/24/16  0500 11/24/16 0519 11/24/16 1455  BP: 135/67  130/66 106/86  Pulse: (!) 109  (!) 112 (!) 30  Resp: _0 Temp: 99 F (37.2 C)  97.7 F (36.5 C) 98.2 F (36.8 C)  TempSrc: Oral   Oral Oral  SpO2: 96%  94% (!) 88%  Weight:  75.3 kg (166 lb)    Height:        Intake/Output Summary (Last 24 hours) at 11/24/16 1745 Last data filed at 11/24/16 1017  Gross per 24 hour  Intake              243 ml  Output              800 ml  Net             -557 ml   Filed Weights   11/22/16 0201 11/23/16 0447 11/24/16 0500  Weight: 75.1 kg (165 lb 9.6 oz) 74.8 kg (165 lb) 75.3 kg (166 lb)    Examination:  General exam: not oriented to time this am Respiratory system: Clear to auscultation. Respiratory effort normal. Equal chest rise. Decreased breath sounds of left base Cardiovascular system: S1 & S2 heard, RRR. No JVD, murmurs, rubs, gallops or clicks. No pedal edema. Gastrointestinal system: Abdomen is nondistended, soft and nontender. No organomegaly or masses felt. Normal bowel sounds heard. Central nervous system: Alert and Awake, No focal neurological deficits. Extremities: Symmetric 5 x 5 power. Edema right >left lower extremity has largely resolved Skin: No rashes, lesions or ulcers on limited exam, on limited exam. Musculoskeletal: Neck pain with palpation Psychiatry:Mood & affect appropriate.     Data Reviewed: I have personally reviewed following labs and imaging studies  CBC:  Recent Labs Lab 11/20/16 0436 11/21/16 0450 11/22/16 1003 11/23/16 0506 11/24/16 1602  WBC 8.2 8.4 10.9* 12.4* 13.8*  NEUTROABS 6.1  --  9.0*  --   --   HGB 8.6* 8.8* 10.6* 9.9* 9.9*  HCT 26.1* 27.0* 31.8* 29.7* 30.2*  MCV 92.6 91.5 91.4 90.5 91.5  PLT 297 341 470* 472* 110*   Basic Metabolic Panel:  Recent Labs Lab 11/20/16 0436 11/21/16 0450 11/22/16 1003 11/23/16 0506 11/24/16 1602  NA 141 137 138 135 135  K 3.7 3.8 3.8 3.6 4.0  CL 107 102 97* 96* 97*  CO2 _1 GLUCOSE 111* 100* 110* 110* 192*  BUN _2 25* 29*  CREATININE 1.39* 1.46* 1.40* 1.56* 1.54*  CALCIUM 9.1 9.7 10.4* 9.8 9.9   GFR: Estimated Creatinine Clearance: 38.2 mL/min (by C-G  formula based on SCr of 1.54 mg/dL (H)). Liver Function Tests:  Recent Labs Lab 11/20/16 0436 11/21/16 0450 11/22/16 1003  AST 61* 45* 38  ALT 65* 54 53  ALKPHOS 46 48 54  BILITOT 0.6 0.5 0.8  PROT 5.5* 5.5* 6.6  ALBUMIN 2.2* 2.3* 2.8*   No results for input(s): LIPASE, AMYLASE in the last 168 hours.  Recent Labs Lab 11/21/16 0450  AMMONIA 16   Coagulation Profile: No results for input(s): INR, PROTIME in the last 168 hours. Cardiac Enzymes:  Recent Labs Lab 11/21/16 0450 11/22/16 1003  CKTOTAL 489* 179   BNP (last 3 results) No results for input(s): PROBNP in the last 8760 hours. HbA1C: No results for input(s): HGBA1C in the last 72 hours. CBG: No results for input(s): GLUCAP in the last 168 hours. Lipid Profile: No results for input(s): CHOL, HDL, LDLCALC, TRIG, CHOLHDL, LDLDIRECT in the last 72  hours. Thyroid Function Tests: No results for input(s): TSH, T4TOTAL, FREET4, T3FREE, THYROIDAB in the last 72 hours. Anemia Panel: No results for input(s): VITAMINB12, FOLATE, FERRITIN, TIBC, IRON, RETICCTPCT in the last 72 hours. Sepsis Labs: No results for input(s): PROCALCITON, LATICACIDVEN in the last 168 hours.  Recent Results (from the past 240 hour(s))  Blood culture (routine x 2)     Status: None   Collection Time: 11/16/16  4:56 PM  Result Value Ref Range Status   Specimen Description BLOOD RIGHT WRIST  Final   Special Requests BOTTLES DRAWN AEROBIC AND ANAEROBIC 5CC  Final   Culture NO GROWTH 5 DAYS  Final   Report Status 11/21/2016 FINAL  Final  Blood culture (routine x 2)     Status: None   Collection Time: 11/16/16  5:00 PM  Result Value Ref Range Status   Specimen Description BLOOD LEFT WRIST  Final   Special Requests AEROBIC BOTTLE ONLY 10CC  Final   Culture NO GROWTH 5 DAYS  Final   Report Status 11/21/2016 FINAL  Final  Respiratory Panel by PCR     Status: None   Collection Time: 11/16/16  7:47 PM  Result Value Ref Range Status   Adenovirus  NOT DETECTED NOT DETECTED Final   Coronavirus 229E NOT DETECTED NOT DETECTED Final   Coronavirus HKU1 NOT DETECTED NOT DETECTED Final   Coronavirus NL63 NOT DETECTED NOT DETECTED Final   Coronavirus OC43 NOT DETECTED NOT DETECTED Final   Metapneumovirus NOT DETECTED NOT DETECTED Final   Rhinovirus / Enterovirus NOT DETECTED NOT DETECTED Final   Influenza A NOT DETECTED NOT DETECTED Final   Influenza B NOT DETECTED NOT DETECTED Final   Parainfluenza Virus 1 NOT DETECTED NOT DETECTED Final   Parainfluenza Virus 2 NOT DETECTED NOT DETECTED Final   Parainfluenza Virus 3 NOT DETECTED NOT DETECTED Final   Parainfluenza Virus 4 NOT DETECTED NOT DETECTED Final   Respiratory Syncytial Virus NOT DETECTED NOT DETECTED Final   Bordetella pertussis NOT DETECTED NOT DETECTED Final   Chlamydophila pneumoniae NOT DETECTED NOT DETECTED Final   Mycoplasma pneumoniae NOT DETECTED NOT DETECTED Final  MRSA PCR Screening     Status: None   Collection Time: 11/19/16  9:04 PM  Result Value Ref Range Status   MRSA by PCR NEGATIVE NEGATIVE Final    Comment:        The GeneXpert MRSA Assay (FDA approved for NASAL specimens only), is one component of a comprehensive MRSA colonization surveillance program. It is not intended to diagnose MRSA infection nor to guide or monitor treatment for MRSA infections.   Culture, blood (routine x 2)     Status: None (Preliminary result)   Collection Time: 11/21/16  9:05 AM  Result Value Ref Range Status   Specimen Description BLOOD RIGHT HAND  Final   Special Requests IN PEDIATRIC BOTTLE 2CC  Final   Culture NO GROWTH 3 DAYS  Final   Report Status PENDING  Incomplete  Culture, blood (routine x 2)     Status: None (Preliminary result)   Collection Time: 11/21/16  9:05 AM  Result Value Ref Range Status   Specimen Description BLOOD LEFT ANTECUBITAL  Final   Special Requests IN PEDIATRIC BOTTLE 2CC  Final   Culture NO GROWTH 3 DAYS  Final   Report Status PENDING   Incomplete  Urine culture     Status: None   Collection Time: 11/21/16 12:52 PM  Result Value Ref Range Status   Specimen Description URINE,  RANDOM  Final   Special Requests NONE  Final   Culture NO GROWTH  Final   Report Status 11/22/2016 FINAL  Final         Radiology Studies: No results found.      Scheduled Meds: . enoxaparin (LOVENOX) injection  40 mg Subcutaneous Q24H  . feeding supplement (ENSURE ENLIVE)  237 mL Oral TID BM  . fluticasone  2 spray Each Nare Daily  . guaiFENesin  600 mg Oral BID  . mirabegron ER  50 mg Oral Daily  . multivitamin with minerals  1 tablet Oral Daily  . pantoprazole  40 mg Oral Daily  . QUEtiapine  25 mg Oral BID  . senna-docusate  2 tablet Oral BID  . simvastatin  20 mg Oral q1800  . sodium chloride flush  3 mL Intravenous Q12H   Continuous Infusions:    LOS: 8 days    Time spent: 25 minutes  Zamere Pasternak, MD PhD [facility] Pager [phone]  If 7PM-7AM, please contact night-coverage [email or web address] Password [facility] 11/24/2016, 5:44 PM

## 2016-11-24 NOTE — Progress Notes (Addendum)
Physical Therapy Treatment Patient Details Name: Jeff Rivera MRN: [id number] DOB: 1941/01/31 Today's Date: 11/24/2016    History of Present Illness 76 y.o. with known hx of HTN, CKD stage III, chronic GI bleed, recent dx of DVT back in December when he was initially placed on Xarelto but subsequently developed GI bleed, just discharged 2/3 (was hospitalized from 2/2 - 2/3 for IVC filter placement), now presented from home by his son for evaluation of progressively worsening confusion, poor oral intake since being discharged    PT Comments    Significantly weaker, slower processing, decr ability to participate and follow commands today compared to last session; Difficulty standing with +3 assist; Opted for Lateral scoot transfer to recliner with +2 max assist; Discussed pt status with primary RN; she has worked with Jeff Rivera for the past few days, and indicated his strength and ability to participate has tended to wax and wane; noted pt with Normal Pressure Hydrocephalus -- perhaps waxing and waning functional mobility is related to NPH?  In the chair he is tending to lean Left; propped with pillows for better sitting posture;   Noted also R shoulder pain, which is new compared to last PT session; Painful to roll to Right, limited shoulder ROM in shoulder flexion and abduction; RN aware   Follow Up Recommendations  SNF;Supervision/Assistance - 24 hour     Equipment Recommendations  None recommended by PT    Recommendations for Other Services       Precautions / Restrictions Precautions Precautions: Fall    Mobility  Bed Mobility Overal bed mobility: Needs Assistance Bed Mobility: Rolling;Supine to Sit Rolling: Max assist   Supine to sit: Max assist;+2 for physical assistance;+2 for safety/equipment     General bed mobility comments: Use of bed pad to get hips turned and then A to get trunk upright  Transfers Overall transfer level: Needs assistance   Transfers:  Lateral/Scoot Transfers          Lateral/Scoot Transfers: +2 physical assistance;Max assist General transfer comment: Opted for lateral scoot transfer after Nursing staff informed me he was having a difficult time moving and he was unable to stand wth +3 assist; Scooted to recliner with armrest dropped on pt's R; used bed pad to cradle hips and required bil knee block for stability  Ambulation/Gait             General Gait Details: Unable to stand today   Stairs            Wheelchair Mobility    Modified Rankin (Stroke Patients Only)       Balance     Sitting balance-[name]: Poor       Standing balance-[name]: Zero                      Cognition Arousal/Alertness: Awake/alert Behavior During Therapy: WFL for tasks assessed/performed Overall Cognitive Status: Impaired/Different from baseline Area of Impairment: Memory;Following commands;Safety/judgement     Memory: Decreased short-term memory Following Commands: Follows one step commands inconsistently;Follows one step commands with increased time Safety/Judgement: Decreased awareness of safety;Decreased awareness of deficits     General Comments: slow response to questions; slow processing with simple mobility commands    Exercises      General Comments        Pertinent Vitals/Pain Pain Assessment: Faces Faces Pain Scale: Hurts even more Pain Location: R shoulder Pain Descriptors / Indicators: Grimacing Pain Intervention(s): Limited activity within patient's tolerance;Monitored during session  Home Living                      Prior Function            PT Goals (current goals can now be found in the care plan section) Acute Rehab PT Goals Patient Stated Goal: Unable to state today PT Goal Formulation: With family Time For Goal Achievement: 12/04/16 Potential to Achieve Goals: Fair Progress towards PT goals: Not progressing toward goals - comment (profoundly  weak this session)    Frequency    Min 3X/week      PT Plan Current plan remains appropriate;Other (comment) (Will consider updating goals (downgrading) if needed)    Co-evaluation             End of Session Equipment Utilized During Treatment: Other (comment) (bed pad) Activity Tolerance: Patient limited by fatigue;Other (comment) (significantly weaker compared to last PT session) Patient left: in chair;with call bell/phone within reach;with chair alarm set     Time: [id number] PT Time Calculation (min) (ACUTE ONLY): 17 min  Charges:  $Therapeutic Activity: 8-22 mins                    G Codes:      Colletta [place] December 20, 2016, 2:40 PM  Roney [place], [place] Pager [phone] Office [phone]

## 2016-11-24 NOTE — Progress Notes (Addendum)
Initial Nutrition Assessment  DOCUMENTATION CODES:   Not applicable  INTERVENTION:   -Ensure Enlive po TID, each supplement provides 350 kcal and 20 grams of protein  NUTRITION DIAGNOSIS:   Inadequate oral intake related to poor appetite as evidenced by meal completion < 50%.  GOAL:   Patient will meet greater than or equal to 90% of their needs  MONITOR:   PO intake, Supplement acceptance, Labs, Weight trends, Skin, I & O's  REASON FOR ASSESSMENT:   Malnutrition Screening Tool    ASSESSMENT:   Jeff Rivera is a 76 y.o. with known hx of HTN, CKD stage III, chronic GI bleed, recent dx of DVT back in December when he was initially placed on Xarelto but subsequently developed GI bleed, just discharged 2/3 (was hospitalized from 2/2 - 2/3 for IVC filter placement), now presented from home by his son for evaluation of progressively worsening confusion, poor oral intake since being discharged  Pt admitted with sepsis secondary to HCAP.   Pt sleeping soundly at time of visit. Pt did not arouse when greeted by this RD. No family members are available at time of visit to obtain additional hx.   Per chart review, meal completion has been poor since admission (PO: 0-50%). Wt hx reveals that UBW is around 170#. Pt with edema on admission- noted lasix resumed on 11/21/16. Suspect some wt loss likely related to diuresis.   Pt did not appear to have sings of fat or muscle depletion.  Given prolonged poor oral intake, pt would benefit from addition of nutritional supplements. RD to order.   Per RNCM note, plan to d/c to SNF once medically stable.   Labs reviewed.   Diet Order:  Diet regular Room service appropriate? Yes; Fluid consistency: Thin  Skin:  Reviewed, no issues  Last BM:  11/23/16  Height:   Ht Readings from Last 1 Encounters:  11/16/16 5' 7.5" (1.715 m)    Weight:   Wt Readings from Last 1 Encounters:  11/24/16 166 lb (75.3 kg)    Ideal Body Weight:  68.6  kg  BMI:  Body mass index is 25.62 kg/m.  Estimated Nutritional Needs:   Kcal:  1650-1850  Protein:  75-90 grams  Fluid:  1.6-1.8 L  EDUCATION NEEDS:   No education needs identified at this time  Jeanette Rauth A. Jimmye Norman, RD, LDN, CDE Pager: [phone] After hours Pager: [phone]

## 2016-11-24 NOTE — Care Management Important Message (Signed)
Important Message  Patient Details  Name: Jeff Rivera MRN: [id number] Date of Birth: 01-22-1941   Medicare Important Message Given:  Yes    Giorgia Wahler Montine Circle 11/24/2016, 4:34 PM

## 2016-11-24 NOTE — Progress Notes (Signed)
      INFECTIOUS DISEASE ATTENDING   Date: 11/24/2016  Patient name: Jeff Rivera  Medical record number: [id number]  Date of birth: 1941/05/24    Patient is still afebrile. We will sign off for now. Please call with further questions.   Alcide Evener 11/24/2016, 8:58 AM

## 2016-11-25 DIAGNOSIS — Z8719 Personal history of other diseases of the digestive system: Secondary | ICD-10-CM | POA: Diagnosis not present

## 2016-11-25 DIAGNOSIS — G9349 Other encephalopathy: Secondary | ICD-10-CM | POA: Diagnosis not present

## 2016-11-25 DIAGNOSIS — G9341 Metabolic encephalopathy: Secondary | ICD-10-CM | POA: Diagnosis not present

## 2016-11-25 DIAGNOSIS — R7989 Other specified abnormal findings of blood chemistry: Secondary | ICD-10-CM | POA: Diagnosis not present

## 2016-11-25 DIAGNOSIS — E785 Hyperlipidemia, unspecified: Secondary | ICD-10-CM | POA: Diagnosis not present

## 2016-11-25 DIAGNOSIS — R41841 Cognitive communication deficit: Secondary | ICD-10-CM | POA: Diagnosis not present

## 2016-11-25 DIAGNOSIS — I82401 Acute embolism and thrombosis of unspecified deep veins of right lower extremity: Secondary | ICD-10-CM | POA: Diagnosis not present

## 2016-11-25 DIAGNOSIS — R2689 Other abnormalities of gait and mobility: Secondary | ICD-10-CM | POA: Diagnosis not present

## 2016-11-25 DIAGNOSIS — I350 Nonrheumatic aortic (valve) stenosis: Secondary | ICD-10-CM

## 2016-11-25 DIAGNOSIS — M6281 Muscle weakness (generalized): Secondary | ICD-10-CM | POA: Diagnosis not present

## 2016-11-25 DIAGNOSIS — D649 Anemia, unspecified: Secondary | ICD-10-CM | POA: Diagnosis not present

## 2016-11-25 DIAGNOSIS — I2510 Atherosclerotic heart disease of native coronary artery without angina pectoris: Secondary | ICD-10-CM | POA: Diagnosis not present

## 2016-11-25 DIAGNOSIS — N183 Chronic kidney disease, stage 3 (moderate): Secondary | ICD-10-CM | POA: Diagnosis not present

## 2016-11-25 DIAGNOSIS — R531 Weakness: Secondary | ICD-10-CM | POA: Diagnosis not present

## 2016-11-25 DIAGNOSIS — Z95828 Presence of other vascular implants and grafts: Secondary | ICD-10-CM | POA: Diagnosis not present

## 2016-11-25 DIAGNOSIS — G9340 Encephalopathy, unspecified: Secondary | ICD-10-CM | POA: Diagnosis not present

## 2016-11-25 DIAGNOSIS — R278 Other lack of coordination: Secondary | ICD-10-CM | POA: Diagnosis not present

## 2016-11-25 DIAGNOSIS — A419 Sepsis, unspecified organism: Secondary | ICD-10-CM | POA: Diagnosis not present

## 2016-11-25 DIAGNOSIS — R509 Fever, unspecified: Secondary | ICD-10-CM | POA: Diagnosis not present

## 2016-11-25 DIAGNOSIS — R600 Localized edema: Secondary | ICD-10-CM | POA: Diagnosis not present

## 2016-11-25 LAB — ANCA TITERS: C-ANCA: <1:20 {titer}

## 2016-11-25 LAB — BASIC METABOLIC PANEL
ANION GAP: 11 (ref 5–15)
BUN: 28 mg/dL — ABNORMAL HIGH (ref 6–20)
CALCIUM: 9.7 mg/dL (ref 8.9–10.3)
CHLORIDE: 97 mmol/L — AB (ref 101–111)
CO2: 28 mmol/L (ref 22–32)
CREATININE: 1.44 mg/dL — AB (ref 0.61–1.24)
GFR calc non Af Amer: 45 mL/min — ABNORMAL LOW (ref >60)
GFR, EST AFRICAN AMERICAN: 53 mL/min — AB (ref >60)
Glucose, Bld: 115 mg/dL — ABNORMAL HIGH (ref 65–99)
Potassium: 3.8 mmol/L (ref 3.5–5.1)
SODIUM: 136 mmol/L (ref 135–145)

## 2016-11-25 LAB — CBC
HEMATOCRIT: 28.8 % — AB (ref 39.0–52.0)
HEMOGLOBIN: 9.2 g/dL — AB (ref 13.0–17.0)
MCH: 29.4 pg (ref 26.0–34.0)
MCHC: 31.9 g/dL (ref 30.0–36.0)
MCV: 92 fL (ref 78.0–100.0)
Platelets: 511 10*3/uL — ABNORMAL HIGH (ref 150–400)
RBC: 3.13 MIL/uL — ABNORMAL LOW (ref 4.22–5.81)
RDW: 15 % (ref 11.5–15.5)
WBC: 11 10*3/uL — AB (ref 4.0–10.5)

## 2016-11-25 LAB — PROCALCITONIN: PROCALCITONIN: 0.3 ng/mL

## 2016-11-25 MED ORDER — QUETIAPINE FUMARATE 25 MG PO TABS: 25.0000 mg | ORAL_TABLET | Freq: Two times a day (BID) | ORAL | 0 refills | Status: AC

## 2016-11-25 MED ORDER — GUAIFENESIN ER 600 MG PO TB12: 600.0000 mg | ORAL_TABLET | Freq: Two times a day (BID) | ORAL | 0 refills | Status: AC

## 2016-11-25 MED ORDER — TRAMADOL HCL 50 MG PO TABS: 50.0000 mg | ORAL_TABLET | Freq: Four times a day (QID) | ORAL | 0 refills | Status: AC | PRN

## 2016-11-25 MED ORDER — SENNOSIDES-DOCUSATE SODIUM 8.6-50 MG PO TABS: 2.0000 | ORAL_TABLET | Freq: Every day | ORAL | 0 refills | Status: AC

## 2016-11-25 MED ORDER — FLUTICASONE PROPIONATE 50 MCG/ACT NA SUSP: 2.0000 | Freq: Every day | NASAL | 0 refills | Status: AC

## 2016-11-25 MED ORDER — ENSURE ENLIVE PO LIQD: 237.0000 mL | Freq: Three times a day (TID) | ORAL | 12 refills | Status: DC

## 2016-11-25 NOTE — Progress Notes (Signed)
Jeff Rivera to be D/C'd Skilled nursing facility per MD order.   VSS, Skin clean, dry and intact without evidence of skin break down, no evidence of skin tears noted. IV catheter discontinued intact. Site without signs and symptoms of complications. Dressing and pressure applied.  An After Visit Summary was printed and given to transport.  Report attempted to [facility]. Held on phone for 13 minutes before it automatically ended my call.  Patient D/C'd via [facility].  [name] 11/30/2016 3:33 PM  \

## 2016-11-25 NOTE — Clinical Social Work Placement (Signed)
   CLINICAL SOCIAL WORK PLACEMENT  NOTE  Date:  11/28/2016  Patient Details  Name: Jeff Rivera MRN: [id number] Date of Birth: 1940-10-25  Clinical Social Work is seeking post-discharge placement for this patient at the [place] level of care (*CSW will initial, date and re-position this form in  chart as items are completed):  Yes   Patient/family provided with [place] Work Department's list of facilities offering this level of care within the geographic area requested by the patient (or if unable, by the patient's family).  Yes   Patient/family informed of their freedom to choose among providers that offer the needed level of care, that participate in Medicare, Medicaid or managed care program needed by the patient, have an available bed and are willing to accept the patient.  Yes   Patient/family informed of [place]'s ownership interest in [facility] and [facility], as well as of the fact that they are under no obligation to receive care at these facilities.  PASRR submitted to EDS on       PASRR number received on       Existing PASRR number confirmed on 11/19/16     FL2 transmitted to all facilities in geographic area requested by pt/family on 11/19/16     FL2 transmitted to all facilities within larger geographic area on       Patient informed that his/her managed care company has contracts with or will negotiate with certain facilities, including the following:        Yes   Patient/family informed of bed offers received.  Patient chooses bed at [facility]     Physician recommends and patient chooses bed at      Patient to be transferred to [facility] on 12/08/2016.  Patient to be transferred to facility by [facility]     Patient family notified on 11/29/2016 of transfer.  Name of family member notified:  Jeff Rivera     PHYSICIAN       Additional Comment:    _______________________________________________ Benard Halsted,  [place] 11/19/2016, 11:10 AM

## 2016-11-25 NOTE — Clinical Social Work Placement (Signed)
   CLINICAL SOCIAL WORK PLACEMENT  NOTE  Date:  11/28/2016  Patient Details  Name: Jeff Rivera MRN: [id number] Date of Birth: 07-29-1941  Clinical Social Work is seeking post-discharge placement for this patient at the [place] level of care (*CSW will initial, date and re-position this form in  chart as items are completed):  Yes   Patient/family provided with [place] Work Department's list of facilities offering this level of care within the geographic area requested by the patient (or if unable, by the patient's family).  Yes   Patient/family informed of their freedom to choose among providers that offer the needed level of care, that participate in Medicare, Medicaid or managed care program needed by the patient, have an available bed and are willing to accept the patient.  Yes   Patient/family informed of [place]'s ownership interest in [facility] and [facility], as well as of the fact that they are under no obligation to receive care at these facilities.  PASRR submitted to EDS on       PASRR number received on       Existing PASRR number confirmed on 11/19/16     FL2 transmitted to all facilities in geographic area requested by pt/family on 11/19/16     FL2 transmitted to all facilities within larger geographic area on       Patient informed that his/her managed care company has contracts with or will negotiate with certain facilities, including the following:        Yes   Patient/family informed of bed offers received.  Patient chooses bed at [facility]     Physician recommends and patient chooses bed at      Patient to be transferred to [facility] on 11/22/2016.  Patient to be transferred to facility by [facility]     Patient family notified on 11/20/2016 of transfer.  Name of family member notified:  camde     PHYSICIAN       Additional Comment:    _______________________________________________ Benard Halsted,  [place] 11/17/2016, 2:35 PM

## 2016-11-25 NOTE — Care Management Note (Signed)
Case Management Note  Patient Details  Name: Jeff Rivera MRN: [id number] Date of Birth: 13-Apr-1941  Subjective/Objective:        Admitted with fever.           Action/Plan: Plan is to d/c to SNF today.  Expected Discharge Date:  12/01/2016               Expected Discharge Plan:   In-House Referral:  Clinical Social Work  Discharge planning Services  CM Consult   Status of Service:  Completed, signed off  If discussed at [employer], dates discussed:    Additional Comments:  Sharin Mons, RN 11/30/2016, 12:18 PM

## 2016-11-25 NOTE — Progress Notes (Signed)
Patient will DC to: [facility] Anticipated DC date: 11/22/2016 Family notified: Son Transport by: Corey Harold   Per MD patient ready for DC to [facility]. RN, patient, patient's family, and facility notified of DC. Discharge Summary sent to facility. RN given number for report. DC packet on chart. Ambulance transport requested for patient.   CSW signing off.  Cedric Fishman, [place] Social Worker [phone]

## 2016-11-25 NOTE — Discharge Summary (Signed)
Discharge Summary  EUCLID CASSETTA KPV:[id number] DOB: June 12, 1941  PCP: Thressa Sheller, MD  Admit date: 11/16/2016 Discharge date: 11/19/2016  Time spent: >64mns, time from 10:35am to 11:10am  Recommendations for Outpatient Follow-up:  [facility] repeat cbc/bmp at follow up 2. F/u with neurology for confusion, normal pressure hydrocephalus 3. [facility] MWatt Climesfor gi bleed, colonoscopy 4. F/u with cardiology Dr GEinar Gipfor CAD, aortic stenosis  Discharge Diagnoses:  Active Hospital Problems   Diagnosis Date Noted  . Fever 11/22/2016  . LFT elevation   . Iron deficiency anemia due to chronic blood loss   . CRI (chronic renal insufficiency) 11/22/2016  . Degenerative joint disease 11/22/2016  . Status post left knee replacement 11/22/2016  . GI bleed 11/22/2016  . S/P IVC filter 11/22/2016  . Acute blood loss anemia 11/14/2016  . DVT (deep venous thrombosis) ([place] 11/13/2016  . Anemia 07/24/2016  . Dyslipidemia 09/12/2013  . CAD (coronary artery disease) 09/12/2013  . Aortic stenosis 09/12/2013    Resolved Hospital Problems   Diagnosis Date Noted Date Resolved  No resolved problems to display.    Discharge Condition: stable  Diet recommendation: heart healthy  Filed Weights   11/22/16 0201 11/23/16 0447 11/24/16 0500  Weight: 75.1 kg (165 lb 9.6 oz) 74.8 kg (165 lb) 75.3 kg (166 lb)    History of present illness:  Referring MD/NP/PA: IBland Span PA  PCP: MThressa Sheller MD  Patient coming from: Home  Chief Complaint: confusion   HPI: Jeff LEVERETTEis a 76y.o. with known hx of HTN, CKD stage III, chronic GI bleed, recent dx of DVT back in December when he was initially placed on Xarelto but subsequently developed GI bleed, just discharged 2/3 (was hospitalized from 2/2 - 2/3 for IVC filter placement), now presented from home by his son for evaluation of progressively worsening confusion, poor oral intake since  being discharged. Please note that son is not present at the time of the admission and pt still somewhat confused, unable to provide much of the details why he is here. He currently denies chest pain but reports some dyspnea when moving around. No reported fevers.   In ED, pt noted to be confused, VS notable for T 102.5 F, HR up to 120's, RR up to 23, WBC up to 14 K. CXR worrisome for LLL PNA. TRH asked to admit to telemetry unit.   Hospital Course:  Principal Problem:   Fever Active Problems:   CAD (coronary artery disease)   Aortic stenosis   Dyslipidemia   Anemia   DVT (deep venous thrombosis) (HCC)   Acute blood loss anemia   CRI (chronic renal insufficiency)   Degenerative joint disease   Status post left knee replacement   GI bleed   S/P IVC filter   LFT elevation   Iron deficiency anemia due to chronic blood loss   Metabolic encephalophaty/progressive confusion (presenting symptom) Likely multifactorial, from infection? Family also report patent has h/o prolonged encephalopathy with anesthesia in the past CT head showed normal pressure hydrocephalus, no acute findings. ( mri brain in 2014 with similar findings) RPR /HIV negative, b12/ folate unremarkable, tsh wnl, ammonia 16,  Patient has intermittent agitation, delirium, family report patient at baseline no confusion and independent.  Started trial of seroquel, improving, he is oriented to place and person, but not to time Need outpatient follow up with neurology for normal pressure hydrocephalus,  He is discharged to SNF  sepsis presented on admission, he presented with confusion, leukocytosis wbc14.2, fever 102 CXR left lower lobe infiltrate,  Respiratory viral penal negative, sputum culture not collected, blood culture no growth, mrsa screening negative, urine strep pneumonia antigen negative  he is treated with vanc/zosyn since admission for presumed health care acquired pneumonia, with clinical improvement, wbc  has normalized, less confusion, change abx to oral doxycycline on 2/8 spike another fever 100.8 on 2/9, another fever on 101 on 2/10, CT chest/ab/pel on 2/12 no clear source of infection, repeat UA unremarkable, repeat blood culture no growth,  Ana unremarkable, anca in process, RF mildly elevated  infectious disease consulted who recommend observe off abx. Patient is fever free for 48hr off abx, he is discharged to SNF.   LFT elevation, with normal bili and alk phos, hepatitis panel, liver US gallbladder sludge, fatty liver  possible from sepsis, may be mild rhabdo CK mildly elevated initially on presentation. Both ck and lft now normalized   CKD III:  cr 1.3-1.6 close to baseline ua no infection Renal dosing meds  Acute deep vein thrombosis (DVT) of femoral vein of right lower extremity ([place]) , he was hospitalized for this from  2/2 to 2/3 - IVC filter placed secondary to GI bleed. xarelto held - Pain control, family report patient is not able to tolerate any narcotic ( all cause confusion), except ultram, he is on prn ultram    Bilateral lower extremity edema: right > left,small pleural effusion, trial of lasix if bp allows Edema has improved, he is discharged on prn lasix  Chronic Anemia: normocytic With h/o gi bleed, off anticoagulation, off asa Anemia work up Negative egd, he is to follow [facility] with [facility] Dr Watt Climes for colonoscopy outpatient.    CAD (coronary artery disease) - continue statin, asa discontinued from recent hospitalization due to gi bleed - no chest pain reported    Dyslipidemia  - stable on statin. May need t od/c statin due to elevated ck,   Right shoulder pain and neck pain, ct soft tissue neck and right shoulder x ray no acute findings, symptom has improved  FTT: PT/OT/SNF   DVT prophylaxis while in the hospital: Lovenox Code Status: Full Family Communication: patient ,  Disposition Plan:  SNF on 2/13   Consultants:   Infectious  disease   Procedures: none   Antimicrobials: vancomycin, zosyn from admission to 2/8  Doxycycline from 2/8 to 2/10   Discharge Exam: BP (!) 138/59 (BP Location: Right Arm)   Pulse 95   Temp 98.3 F (36.8 C) (Oral)   Resp 16   Ht 5' 7.5" (1.715 m)   Wt 75.3 kg (166 lb)   SpO2 96%   BMI 25.62 kg/m   General: alert, calm and follow command, not oriented to time, but to place and person Cardiovascular: RRR, + murmur Respiratory: CATBL Extremity: Edema right >left lower extremity has largely resolved  Discharge Instructions You were cared for by a hospitalist during your hospital stay. If you have any questions about your discharge medications or the care you received while you were in the hospital after you are discharged, you can call the unit and asked to speak with the hospitalist on call if the hospitalist that took care of you is not available. Once you are discharged, your primary care physician will handle any further medical issues. Please note that NO REFILLS for any discharge medications will be authorized once you are discharged, as it is imperative that you return to your  primary care physician (or establish a relationship with a primary care physician if you do not have one) for your aftercare needs so that they can reassess your need for medications and monitor your lab values.  Discharge Instructions    Diet - low sodium heart healthy    Complete by:  As directed    Increase activity slowly    Complete by:  As directed      Allergies as of 11/24/2016   No Known Allergies     Medication List    TAKE these medications   acetaminophen 500 MG tablet Commonly known as:  TYLENOL Take 500 mg by mouth every 6 (six) hours as needed for mild pain.   Co Q 10 100 MG Caps Take 1 capsule by mouth daily.   feeding supplement (ENSURE ENLIVE) Liqd Take 237 mLs by mouth 3 (three) times daily between meals.   fluticasone 50 MCG/ACT nasal spray Commonly known as:   FLONASE Place 2 sprays into both nostrils daily. Start taking on:  11/26/2016   furosemide 40 MG tablet Commonly known as:  LASIX Take 1 tablet (40 mg total) by mouth daily as needed for fluid.   guaiFENesin 600 MG 12 hr tablet Commonly known as:  MUCINEX Take 1 tablet (600 mg total) by mouth 2 (two) times daily.   multivitamin tablet Take 1 tablet by mouth daily.   MYRBETRIQ 50 MG Tb24 tablet Generic drug:  mirabegron ER Take 50 mg by mouth daily.   omeprazole 20 MG capsule Commonly known as:  PRILOSEC Take 40 mg by mouth daily.   QUEtiapine 25 MG tablet Commonly known as:  SEROQUEL Take 1 tablet (25 mg total) by mouth 2 (two) times daily.   senna-docusate 8.6-50 MG tablet Commonly known as:  Senokot-S Take 2 tablets by mouth at bedtime.   simvastatin 20 MG tablet Commonly known as:  ZOCOR Take 20 mg by mouth every evening.   traMADol 50 MG tablet Commonly known as:  ULTRAM Take 1-2 tablets (50-100 mg total) by mouth every 6 (six) hours as needed for moderate pain.      No Known Allergies  Contact information for follow-up providers    [facility] Follow up in 3 week(s).   Why:  for normal pressure hydrocephalus, confusion Contact information: [address]     [address]  [place] [address] [phone]       Adrian Prows, MD Follow up in 2 week(s).   Specialty:  Cardiology Contact information: [place] [address] [phone]        [facility] E, MD Follow up in 2 week(s).   Specialty:  Gastroenterology Why:  for gi bleed, schedule colonoscopy Contact information: [address] [place] [address] [phone]            Contact information for after-discharge care    Destination    [facility] Follow up.   Specialty:  [place] information: [address] [place] [phone]                     The results of significant diagnostics from this hospitalization (including imaging, microbiology, ancillary and laboratory) are listed below for reference.    Significant Diagnostic Studies: Ct Abdomen Pelvis Wo Contrast  Result Date: 11/21/2016 CLINICAL DATA:  Fever. EXAM: CT CHEST, ABDOMEN AND PELVIS WITHOUT CONTRAST TECHNIQUE: Multidetector CT imaging of the chest, abdomen and  pelvis was performed following the standard protocol without IV contrast. COMPARISON:  09/20/2008 abdominal CT FINDINGS: CT CHEST FINDINGS Cardiovascular: No cardiomegaly or pericardial effusion. Atherosclerosis, including the coronary arteries. Prominent aortic valvular/annular calcification. Negative esophagus. Mediastinum/Nodes: Negative for mass or adenopathy. Lungs/Pleura: Trace layering pleural effusions. 5 mm pulmonary nodule below the right minor fissure has a flat morphology most consistent with lymph node. No suspicious pulmonary nodule. Mild atelectasis at the bases. Musculoskeletal: No acute or aggressive finding CT ABDOMEN PELVIS FINDINGS Hepatobiliary: Small scattered cystic density structures in the liver. No concerning change since since 2009.Cholelithiasis. No evidence of acute cholecystitis. Normal common bile duct diameter. Pancreas: Unremarkable. Spleen: Unremarkable. Adrenals/Urinary Tract: Negative adrenals. No hydronephrosis or ureteral calculus. 2 left renal calculi measuring up to 4 mm in the upper pole. Unremarkable bladder. Stomach/Bowel: Formed stool throughout the colon. No small bowel obstruction or inflammation. Presacral edema may be related to rectal distention (although only moderate) or volume overload. There is subcutaneous edema about the bilateral hips. Negative appendix. Colonic diverticulosis. Vascular/Lymphatic: No acute vascular abnormality. IVC filter. Aortic atherosclerosis. No mass or adenopathy. Reproductive:No pathologic findings. Other: No ascites or pneumoperitoneum.  Left inguinal hernia containing nonobstructed sigmoid colon. Musculoskeletal: There is asymmetric fat edema around the upper thigh and lateral gluteal musculature on the right IMPRESSION: 1. No definite explanation for fever. 2. Asymmetric fat edema around the right upper femur which could be posttraumatic or inflammatory. Negative for hip effusion or acute osseous finding. 3. Trace pleural effusions and mild atelectasis. 4. Cholelithiasis. 5. Nonobstructive left nephrolithiasis. 6. Left inguinal hernia containing nonobstructed colon. 7. Moderate stool volume. Electronically Signed   By: Monte Fantasia M.D.   On: 11/21/2016 13:18   Dg Lumbar Spine Complete  Result Date: 11/16/2016 CLINICAL DATA:  Low back and right hip pain, worsening. EXAM: LUMBAR SPINE - COMPLETE 4+ VIEW COMPARISON:  Abdominal x-ray dated 09/25/2008 and CT abdomen dated 09/20/2008. FINDINGS: Mild levoscoliosis, likely accentuated by patient positioning. Alignment appears otherwise stable. No fracture line or displaced fracture fragment identified. No acute or suspicious osseous lesion. No compression fracture deformity. Upper sacrum appears intact and normal in mineralization. Mild disc desiccations at each level, with slight disc space narrowings and minimal osseous spurring. No evidence of advanced degenerative change at any level. IVC filter in place with tip at the L1 vertebral body level. Aortic atherosclerosis. Visualized paravertebral soft tissues are otherwise unremarkable. Fairly large amount of stool within the right colon. IMPRESSION: 1. No acute or significant osseous finding. Very mild degenerative change within the lumbar spine. Mild scoliosis. 2. Aortic atherosclerosis. 3. Fairly large amount of stool within the right colon and rectal vault (constipation?). Electronically Signed   By: Franki Cabot M.D.   On: 11/16/2016 14:40   Dg Shoulder Right  Result Date: 11/24/2016 CLINICAL DATA:  Anterior superior shoulder pain around  [facility] joint x1 week. EXAM: RIGHT SHOULDER - 2+ VIEW COMPARISON:  None. FINDINGS: There is no evidence of fracture or dislocation. Calcific rotator cuff tendinopathy noted likely along the infraspinatus. Cough chondrocalcinosis of the [facility] joint with minimal undersurface spurring of the distal clavicle. The visualized ribs and lung are unremarkable. There is aortic atherosclerosis. There is uncovertebral joint spurring of the visualized cervical spine from C5 through C7. IMPRESSION: 1. No acute osseous abnormality of the right shoulder. 2. Calcific rotator cuff tendinopathy likely along the infraspinatus 3. Minimal osteoarthritic degenerative change of the AC joint. 4. 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 5. Aortic atherosclerosis. Electronically Signed  By: Ashley Royalty M.D.   On: 11/24/2016 20:06   Ct Head Wo Contrast  Result Date: 11/16/2016 CLINICAL DATA:  Altered mental status.  Confusion. EXAM: CT HEAD WITHOUT CONTRAST TECHNIQUE: Contiguous axial images were obtained from the base of the skull through the vertex without intravenous contrast. COMPARISON:  Brain MRI 12/14/2012 FINDINGS: Brain: No evidence of acute infarction, hemorrhage, extra-axial collection or mass lesion/mass effect. 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 Microvascular ischemic changes mild. Vascular: Atherosclerotic calcification. Skull: No acute or aggressive finding Sinuses/Orbits: Bilateral cataract resection IMPRESSION: 1. No acute finding. 2. 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 Electronically Signed   By: Monte Fantasia M.D.   On: 11/16/2016 19:00   Ct Soft Tissue Neck Wo Contrast  Result Date: 11/24/2016 CLINICAL DATA:  Initial evaluation for acute right shoulder and neck pain. EXAM: CT NECK WITHOUT CONTRAST TECHNIQUE: Multidetector CT imaging of the neck was performed following the standard protocol without intravenous  contrast. COMPARISON:  None. FINDINGS: Pharynx and larynx: Evaluation of the oral cavity somewhat limited by motion artifact. Oral cavity grossly unremarkable without mass lesion or loculated fluid collection. No definite acute abnormality about the dentition, although evaluation limited by streak artifact from dental amalgam. [place] tonsils symmetric and within normal limits. Minimal asymmetric induration within the left parapharyngeal fat noted without additional acute inflammatory changes, of uncertain significance in this patient with right neck pain. Right parapharyngeal fat preserved. Nasopharynx within normal limits. No retropharyngeal swelling or collection. Epiglottis grossly unremarkable. Vallecula clear. Remainder of the hypopharynx and supraglottic larynx without acute abnormality. True cords grossly symmetric and unremarkable. Subglottic airway clear. Salivary glands: Parotid and submandibular glands within normal limits bilaterally. Thyroid: Thyroid unremarkable. Lymph nodes: No pathologically enlarged lymph nodes identified on this noncontrast examination. Vascular: Atheromatous plaque about the carotid bifurcations bilaterally. Additional plaque within the aortic arch and about the origin of the great vessels. Limited intracranial: Atherosclerotic disease noted within the carotid siphons. Visualized portions of the brain otherwise unremarkable. Visualized orbits: Partially visualized globes and orbits within normal normal limits. Mastoids and visualized paranasal sinuses: Small retention cyst noted within the right maxillary sinus. Visualized paranasal sinuses are otherwise clear. Visualize mastoids are clear. Middle ear cavities well pneumatized. Skeleton: No acute osseous abnormality. No worrisome lytic or blastic osseous lesions. Moderate degenerative spondylolysis present at C5-6. Upper chest: Visualized upper mediastinum within normal limits. Partially visualized lungs are clear. IMPRESSION:  1. Mild asymmetric induration within the left parapharyngeal fat as compared to the right. Finding is nonspecific, but could reflect the sequelae of early/ developing acute pharyngitis. Correlation with physical exam and symptomatology recommended. 2. No other acute abnormality identified within the neck. 3. Moderate atheromatous disease about the carotid bifurcations bilaterally. 4. Moderate degenerative spondylolysis at C5-6. Electronically Signed   By: Jeannine Boga M.D.   On: 11/24/2016 19:40   Ct Chest Wo Contrast  Result Date: 11/21/2016 CLINICAL DATA:  Fever. EXAM: CT CHEST, ABDOMEN AND PELVIS WITHOUT CONTRAST TECHNIQUE: Multidetector CT imaging of the chest, abdomen and pelvis was performed following the standard protocol without IV contrast. COMPARISON:  09/20/2008 abdominal CT FINDINGS: CT CHEST FINDINGS Cardiovascular: No cardiomegaly or pericardial effusion. Atherosclerosis, including the coronary arteries. Prominent aortic valvular/annular calcification. Negative esophagus. Mediastinum/Nodes: Negative for mass or adenopathy. Lungs/Pleura: Trace layering pleural effusions. 5 mm pulmonary nodule below the right minor fissure has a flat morphology most consistent with lymph node. No suspicious pulmonary nodule. Mild atelectasis at the bases. Musculoskeletal: No acute or aggressive finding CT ABDOMEN PELVIS  FINDINGS Hepatobiliary: Small scattered cystic density structures in the liver. No concerning change since since 2009.Cholelithiasis. No evidence of acute cholecystitis. Normal common bile duct diameter. Pancreas: Unremarkable. Spleen: Unremarkable. Adrenals/Urinary Tract: Negative adrenals. No hydronephrosis or ureteral calculus. 2 left renal calculi measuring up to 4 mm in the upper pole. Unremarkable bladder. Stomach/Bowel: Formed stool throughout the colon. No small bowel obstruction or inflammation. Presacral edema may be related to rectal distention (although only moderate) or volume  overload. There is subcutaneous edema about the bilateral hips. Negative appendix. Colonic diverticulosis. Vascular/Lymphatic: No acute vascular abnormality. IVC filter. Aortic atherosclerosis. No mass or adenopathy. Reproductive:No pathologic findings. Other: No ascites or pneumoperitoneum. Left inguinal hernia containing nonobstructed sigmoid colon. Musculoskeletal: There is asymmetric fat edema around the upper thigh and lateral gluteal musculature on the right IMPRESSION: 1. No definite explanation for fever. 2. Asymmetric fat edema around the right upper femur which could be posttraumatic or inflammatory. Negative for hip effusion or acute osseous finding. 3. Trace pleural effusions and mild atelectasis. 4. Cholelithiasis. 5. Nonobstructive left nephrolithiasis. 6. Left inguinal hernia containing nonobstructed colon. 7. Moderate stool volume. Electronically Signed   By: Monte Fantasia M.D.   On: 11/21/2016 13:18   Dg Chest Port 1 View  Result Date: 11/16/2016 CLINICAL DATA:  Possible fever; Complications from anesthesia; Had IVC filter placed for DVTs in right leg 3 days ago; Right leg is now swollen, firm and tender; the patient has extreme back and right hip pain; Hx of HTN, CKD, CAD, aortic stenosis EXAM: PORTABLE CHEST 1 VIEW COMPARISON:  07/24/2016 FINDINGS: Shallow lung inflation. Heart size is normal. Patchy infiltrate is identified in the left lung base. There is subsegmental atelectasis in the right lower lobe. No pulmonary edema. IMPRESSION: Left lower lobe infiltrate.  Right lower lobe atelectasis. Electronically Signed   By: Nolon Nations M.D.   On: 11/16/2016 17:03   Dg Hip Unilat W Or W/o Pelvis 2-3 Views Right  Result Date: 11/16/2016 CLINICAL DATA:  Low back and right hip pain, worsening. No known trauma. EXAM: DG HIP (WITH OR WITHOUT PELVIS) 2-3V RIGHT COMPARISON:  None. FINDINGS: Single view of the pelvis and two views of the right hip are provided. Osseous alignment is normal. Bone  mineralization is normal. No fracture line or displaced fracture fragment identified. No acute or suspicious osseous lesion. Mild degenerative joint space narrowing at the right hip, with associated mild osseous spurring. No evidence of advanced DJD. Soft tissues about the pelvis and right hip are unremarkable. IMPRESSION: 1. No acute findings. 2. Mild degenerative change at the right hip joint. Electronically Signed   By: Franki Cabot M.D.   On: 11/16/2016 14:41   US Abdomen Limited Ruq  Result Date: 11/20/2016 CLINICAL DATA:  Elevated liver function studies. History of hypertension, GE reflux disease, dyslipidemia, liver cysts. EXAM: US ABDOMEN LIMITED - RIGHT UPPER QUADRANT COMPARISON:  CT abdomen and pelvis 09/20/2008 FINDINGS: Gallbladder: Small amount of sludge layering in the gallbladder. No stones identified. No gallbladder wall thickening. Murphy's sign is negative. Common bile duct: Diameter: 3.2 mm, normal Liver: Liver parenchymal echotexture is diffusely increased suggesting fatty infiltration. Cystic lesions seen on prior CT are not well identified at ultrasound. Examination is limited due to patient's body habitus and inability to reposition the patient. IMPRESSION: Gallbladder sludge. No stones. No additional changes to suggest cholecystitis. Probable fatty infiltration of the liver. Electronically Signed   By: Lucienne Capers M.D.   On: 11/20/2016 22:07    Microbiology: Recent Results (  from the past 240 hour(s))  Blood culture (routine x 2)     Status: None   Collection Time: 11/16/16  4:56 PM  Result Value Ref Range Status   Specimen Description BLOOD RIGHT WRIST  Final   Special Requests BOTTLES DRAWN AEROBIC AND ANAEROBIC 5CC  Final   Culture NO GROWTH 5 DAYS  Final   Report Status 11/21/2016 FINAL  Final  Blood culture (routine x 2)     Status: None   Collection Time: 11/16/16  5:00 PM  Result Value Ref Range Status   Specimen Description BLOOD LEFT WRIST  Final   Special  Requests AEROBIC BOTTLE ONLY 10CC  Final   Culture NO GROWTH 5 DAYS  Final   Report Status 11/21/2016 FINAL  Final  Respiratory Panel by PCR     Status: None   Collection Time: 11/16/16  7:47 PM  Result Value Ref Range Status   Adenovirus NOT DETECTED NOT DETECTED Final   Coronavirus 229E NOT DETECTED NOT DETECTED Final   Coronavirus HKU1 NOT DETECTED NOT DETECTED Final   Coronavirus NL63 NOT DETECTED NOT DETECTED Final   Coronavirus OC43 NOT DETECTED NOT DETECTED Final   Metapneumovirus NOT DETECTED NOT DETECTED Final   Rhinovirus / Enterovirus NOT DETECTED NOT DETECTED Final   Influenza A NOT DETECTED NOT DETECTED Final   Influenza B NOT DETECTED NOT DETECTED Final   Parainfluenza Virus 1 NOT DETECTED NOT DETECTED Final   Parainfluenza Virus 2 NOT DETECTED NOT DETECTED Final   Parainfluenza Virus 3 NOT DETECTED NOT DETECTED Final   Parainfluenza Virus 4 NOT DETECTED NOT DETECTED Final   Respiratory Syncytial Virus NOT DETECTED NOT DETECTED Final   Bordetella pertussis NOT DETECTED NOT DETECTED Final   Chlamydophila pneumoniae NOT DETECTED NOT DETECTED Final   Mycoplasma pneumoniae NOT DETECTED NOT DETECTED Final  MRSA PCR Screening     Status: None   Collection Time: 11/19/16  9:04 PM  Result Value Ref Range Status   MRSA by PCR NEGATIVE NEGATIVE Final    Comment:        The GeneXpert MRSA Assay (FDA approved for NASAL specimens only), is one component of a comprehensive MRSA colonization surveillance program. It is not intended to diagnose MRSA infection nor to guide or monitor treatment for MRSA infections.   Culture, blood (routine x 2)     Status: None (Preliminary result)   Collection Time: 11/21/16  9:05 AM  Result Value Ref Range Status   Specimen Description BLOOD RIGHT HAND  Final   Special Requests IN PEDIATRIC BOTTLE 2CC  Final   Culture NO GROWTH 3 DAYS  Final   Report Status PENDING  Incomplete  Culture, blood (routine x 2)     Status: None (Preliminary  result)   Collection Time: 11/21/16  9:05 AM  Result Value Ref Range Status   Specimen Description BLOOD LEFT ANTECUBITAL  Final   Special Requests IN PEDIATRIC BOTTLE 2CC  Final   Culture NO GROWTH 3 DAYS  Final   Report Status PENDING  Incomplete  Urine culture     Status: None   Collection Time: 11/21/16 12:52 PM  Result Value Ref Range Status   Specimen Description URINE, RANDOM  Final   Special Requests NONE  Final   Culture NO GROWTH  Final   Report Status 11/22/2016 FINAL  Final     Labs: Basic Metabolic Panel:  Recent Labs Lab 11/21/16 0450 11/22/16 1003 11/23/16 0506 11/24/16 1602 12/09/2016 0635  NA 137 138  135 135 136  K 3.8 3.8 3.6 4.0 3.8  CL 102 97* 96* 97* 97*  CO2 26 29 29 29 28   GLUCOSE 100* 110* 110* 192* 115*  BUN 16 18 25* 29* 28*  CREATININE 1.46* 1.40* 1.56* 1.54* 1.44*  CALCIUM 9.7 10.4* 9.8 9.9 9.7   Liver Function Tests:  Recent Labs Lab 11/20/16 0436 11/21/16 0450 11/22/16 1003  AST 61* 45* 38  ALT 65* 54 53  ALKPHOS 46 48 54  BILITOT 0.6 0.5 0.8  PROT 5.5* 5.5* 6.6  ALBUMIN 2.2* 2.3* 2.8*   No results for input(s): LIPASE, AMYLASE in the last 168 hours.  Recent Labs Lab 11/21/16 0450  AMMONIA 16   CBC:  Recent Labs Lab 11/20/16 0436 11/21/16 0450 11/22/16 1003 11/23/16 0506 11/24/16 1602 12/01/2016 0635  WBC 8.2 8.4 10.9* 12.4* 13.8* 11.0*  NEUTROABS 6.1  --  9.0*  --   --   --   HGB 8.6* 8.8* 10.6* 9.9* 9.9* 9.2*  HCT 26.1* 27.0* 31.8* 29.7* 30.2* 28.8*  MCV 92.6 91.5 91.4 90.5 91.5 92.0  PLT 297 341 470* 472* 569* 511*   Cardiac Enzymes:  Recent Labs Lab 11/21/16 0450 11/22/16 1003  CKTOTAL 489* 179   BNP: BNP (last 3 results) No results for input(s): BNP in the last 8760 hours.  ProBNP (last 3 results) No results for input(s): PROBNP in the last 8760 hours.  CBG: No results for input(s): GLUCAP in the last 168 hours.     SignedFlorencia Reasons MD, PhD  [facility] 12/07/2016, 11:57 AM

## 2016-11-26 ENCOUNTER — Encounter: Payer: Self-pay | Admitting: Adult Health

## 2016-11-26 ENCOUNTER — Non-Acute Institutional Stay (SKILLED_NURSING_FACILITY): Payer: Medicare Other | Admitting: Adult Health

## 2016-11-26 DIAGNOSIS — A419 Sepsis, unspecified organism: Secondary | ICD-10-CM

## 2016-11-26 DIAGNOSIS — N183 Chronic kidney disease, stage 3 unspecified: Secondary | ICD-10-CM

## 2016-11-26 DIAGNOSIS — I2510 Atherosclerotic heart disease of native coronary artery without angina pectoris: Secondary | ICD-10-CM

## 2016-11-26 DIAGNOSIS — D649 Anemia, unspecified: Secondary | ICD-10-CM | POA: Diagnosis not present

## 2016-11-26 DIAGNOSIS — G9349 Other encephalopathy: Secondary | ICD-10-CM | POA: Diagnosis not present

## 2016-11-26 DIAGNOSIS — Z8719 Personal history of other diseases of the digestive system: Secondary | ICD-10-CM

## 2016-11-26 DIAGNOSIS — R531 Weakness: Secondary | ICD-10-CM | POA: Diagnosis not present

## 2016-11-26 DIAGNOSIS — E785 Hyperlipidemia, unspecified: Secondary | ICD-10-CM

## 2016-11-26 DIAGNOSIS — I82401 Acute embolism and thrombosis of unspecified deep veins of right lower extremity: Secondary | ICD-10-CM | POA: Diagnosis not present

## 2016-11-26 DIAGNOSIS — R600 Localized edema: Secondary | ICD-10-CM | POA: Diagnosis not present

## 2016-11-26 LAB — CULTURE, BLOOD (ROUTINE X 2)
CULTURE: NO GROWTH
Culture: NO GROWTH

## 2016-11-28 NOTE — Progress Notes (Signed)
DATE:   11/26/16   MRN:  [id number]  BIRTHDAY: [birth year]  Facility:  Nursing Home Location:  [place] Room Number: 403-B  LEVEL OF CARE:  [id number])  Contact Information    Name Relation Home Work Mobile   Canter,Sarah Spouse [phone]  [phone]   Ryleigh, Beisel [phone]  [phone]       Code Status History    Date Active Date Inactive Code Status Order ID Comments User Context   11/16/2016  8:05 PM 11/21/2016  6:18 PM Full Code [id number]  Theodis Blaze, MD Inpatient   11/14/2016  2:04 PM 11/14/2016  2:04 PM Full Code [id number]  Adrian Prows, MD Inpatient   11/14/2016  2:04 PM 11/15/2016  6:49 PM Full Code [id number]  Elwin Mocha, MD Inpatient   07/25/2016  3:11 AM 07/27/2016  4:28 PM Full Code [id number]  Reubin Milan, MD Inpatient   07/15/2016  3:07 AM 07/19/2016  2:03 PM Full Code [id number]  Rod Can, MD Inpatient   10/27/2014  5:45 PM 10/31/2014  5:35 PM Full Code [id number]  Mcarthur Rossetti, MD Inpatient       Chief Complaint  Patient presents with  . Hospitalization Follow-up    HISTORY OF PRESENT ILLNESS:  This is a 63-YO male seen for hospital follow-up.  He was admitted to [facility] on 11/16/2016 for progressive worsening confusion and poor oral intake.  CT head showed normal pressure hydrocephalus, no acute findings. He was started on Seroquel  With some improvement. CXR showed LLL infiltrate, WBC 14.2 and T 102. He was treated with vanc/zosyn then changed to oral Doxycycline. He spiked another fever and CT chest/ab/pel on 2/12 no clear source of infection. UA was unremarkable, blood culture no growth, ANA unremarkable. Infectious disease was consulted and recommended to observe off ATB. He has been fever free X 2 days so discharged to [facility] for short-term rehabilitation.  He has PMH of HTN, CKD stage III, chronic GI bleed, recent DVT in December and was initially placed on Xarelto bu  developed GI bleed. He was recently hospitalized 2/2 - 2/3 for IVC filter placement.  PAST MEDICAL HISTORY:  Past Medical History:  Diagnosis Date  . Aortic stenosis    mild   . Arthritis    OA  . CAD (coronary artery disease)   . Chronic kidney disease    creatine 1.5 was 1.5 09/2015  . Complication of anesthesia    patient was confused for 6-8 days.  doesnt remember anything from time he registered several days after he was admitted to [facility]  . Dyslipidemia   . ED (erectile dysfunction)   . GERD (gastroesophageal reflux disease)   . Heart murmur   . History of kidney stones   . Hypertension      CURRENT MEDICATIONS: Reviewed  Patient's Medications  New Prescriptions   No medications on file  Previous Medications   ACETAMINOPHEN (TYLENOL) 500 MG TABLET    Take 500 mg by mouth every 6 (six) hours as needed for mild pain.   COENZYME Q10 (CO Q 10) 100 MG CAPS    Take 1 capsule by mouth daily.    FLUTICASONE (FLONASE) 50 MCG/ACT NASAL SPRAY    Place 2 sprays into both nostrils daily.   FUROSEMIDE (LASIX) 40 MG TABLET    Take 1 tablet (40 mg total) by mouth daily as needed for fluid.   GUAIFENESIN (MUCINEX) 600 MG  12 HR TABLET    Take 1 tablet (600 mg total) by mouth 2 (two) times daily.   MIRABEGRON ER (MYRBETRIQ) 50 MG TB24 TABLET    Take 50 mg by mouth daily.   MULTIPLE VITAMIN (MULTIVITAMIN) TABLET    Take 1 tablet by mouth daily.   OMEPRAZOLE (PRILOSEC) 20 MG CAPSULE    Take 40 mg by mouth daily.    QUETIAPINE (SEROQUEL) 25 MG TABLET    Take 1 tablet (25 mg total) by mouth 2 (two) times daily.   SENNA-DOCUSATE (SENOKOT-S) 8.6-50 MG TABLET    Take 2 tablets by mouth at bedtime.   SIMVASTATIN (ZOCOR) 20 MG TABLET    Take 20 mg by mouth every evening.    TRAMADOL (ULTRAM) 50 MG TABLET    Take 1-2 tablets (50-100 mg total) by mouth every 6 (six) hours as needed for moderate pain.  Modified Medications   No medications on file  Discontinued Medications   FEEDING SUPPLEMENT,  ENSURE ENLIVE, (ENSURE ENLIVE) LIQD    Take 237 mLs by mouth 3 (three) times daily between meals.     No Known Allergies   REVIEW OF SYSTEMS:  GENERAL: no change in appetite, no fatigue, no weight changes, no fever, chills or weakness EYES: Denies change in vision, dry eyes, eye pain, itching or discharge EARS: Denies change in hearing, ringing in ears, or earache NOSE: Denies nasal congestion or epistaxis MOUTH and THROAT: Denies oral discomfort, gingival pain or bleeding, pain from teeth or hoarseness   RESPIRATORY: no cough, SOB, DOE, wheezing, hemoptysis CARDIAC: no chest pain, edema or palpitations GI: no abdominal pain, diarrhea, constipation, heart burn, nausea or vomiting GU: Denies dysuria, frequency, hematuria, incontinence, or discharge PSYCHIATRIC: Denies feeling of depression or anxiety. No report of hallucinations, insomnia, paranoia, or agitation    PHYSICAL EXAMINATION  GENERAL APPEARANCE: Well nourished. In no acute distress. Normal body habitus SKIN:  Skin is warm and dry.  HEAD: Normal in size and contour. No evidence of trauma EYES: Lids open and close normally. No blepharitis, entropion or ectropion. PERRL. Conjunctivae are clear and sclerae are white. Lenses are without opacity EARS: Pinnae are normal. Patient hears normal voice tunes of the examiner MOUTH and THROAT: Lips are without lesions. Oral mucosa is moist and without lesions. Tongue is normal in shape, size, and color and without lesions NECK: supple, trachea midline, no neck masses, no thyroid tenderness, no thyromegaly LYMPHATICS: no LAN in the neck, no supraclavicular LAN RESPIRATORY: breathing is even & unlabored, BS CTAB CARDIAC: RRR, no murmur,no extra heart sounds, no edema GI: abdomen soft, normal BS, no masses, no tenderness, no hepatomegaly, no splenomegaly EXTREMITIES:  Able to move X 4 extremities PSYCHIATRIC: Alert to self and disoriented to time and place. Affect and behavior are  appropriate    LABS/RADIOLOGY: Labs reviewed: Basic Metabolic Panel:  Recent Labs  07/25/16 0452 07/26/16 0659  11/23/16 0506 11/24/16 1602 12/10/2016 0635  NA 136 139  < > 135 135 136  K 4.9 4.6  < > 3.6 4.0 3.8  CL 104 107  < > 96* 97* 97*  CO2 23 27  < > 29 29 28   GLUCOSE 131* 112*  < > 110* 192* 115*  BUN 66* 34*  < > 25* 29* 28*  CREATININE 3.49* 1.72*  < > 1.56* 1.54* 1.44*  CALCIUM 8.5* 9.3  < > 9.8 9.9 9.7  MG 2.5*  --   --   --   --   --  PHOS  --  2.6  --   --   --   --   < > = values in this interval not displayed. Liver Function Tests:  Recent Labs  11/20/16 0436 11/21/16 0450 11/22/16 1003  AST 61* 45* 38  ALT 65* 54 53  ALKPHOS 46 48 54  BILITOT 0.6 0.5 0.8  PROT 5.5* 5.5* 6.6  ALBUMIN 2.2* 2.3* 2.8*    Recent Labs  11/21/16 0450  AMMONIA 16   CBC:  Recent Labs  07/24/16 1837  11/20/16 0436  11/22/16 1003 11/23/16 0506 11/24/16 1602 11/21/2016 0635  WBC 9.6  < > 8.2  < > 10.9* 12.4* 13.8* 11.0*  NEUTROABS 6.8  --  6.1  --  9.0*  --   --   --   HGB 9.0*  < > 8.6*  < > 10.6* 9.9* 9.9* 9.2*  HCT 26.0*  < > 26.1*  < > 31.8* 29.7* 30.2* 28.8*  MCV 92.5  < > 92.6  < > 91.4 90.5 91.5 92.0  PLT 301  < > 297  < > 470* 472* 569* 511*  < > = values in this interval not displayed. Lipid Panel:  Recent Labs  11/21/16 0450  HDL 30*   Cardiac Enzymes:  Recent Labs  11/21/16 0450 11/22/16 1003  CKTOTAL 489* 179    Ct Abdomen Pelvis Wo Contrast  Result Date: 11/21/2016 CLINICAL DATA:  Fever. EXAM: CT CHEST, ABDOMEN AND PELVIS WITHOUT CONTRAST TECHNIQUE: Multidetector CT imaging of the chest, abdomen and pelvis was performed following the standard protocol without IV contrast. COMPARISON:  09/20/2008 abdominal CT FINDINGS: CT CHEST FINDINGS Cardiovascular: No cardiomegaly or pericardial effusion. Atherosclerosis, including the coronary arteries. Prominent aortic valvular/annular calcification. Negative esophagus. Mediastinum/Nodes: Negative for  mass or adenopathy. Lungs/Pleura: Trace layering pleural effusions. 5 mm pulmonary nodule below the right minor fissure has a flat morphology most consistent with lymph node. No suspicious pulmonary nodule. Mild atelectasis at the bases. Musculoskeletal: No acute or aggressive finding CT ABDOMEN PELVIS FINDINGS Hepatobiliary: Small scattered cystic density structures in the liver. No concerning change since since 2009.Cholelithiasis. No evidence of acute cholecystitis. Normal common bile duct diameter. Pancreas: Unremarkable. Spleen: Unremarkable. Adrenals/Urinary Tract: Negative adrenals. No hydronephrosis or ureteral calculus. 2 left renal calculi measuring up to 4 mm in the upper pole. Unremarkable bladder. Stomach/Bowel: Formed stool throughout the colon. No small bowel obstruction or inflammation. Presacral edema may be related to rectal distention (although only moderate) or volume overload. There is subcutaneous edema about the bilateral hips. Negative appendix. Colonic diverticulosis. Vascular/Lymphatic: No acute vascular abnormality. IVC filter. Aortic atherosclerosis. No mass or adenopathy. Reproductive:No pathologic findings. Other: No ascites or pneumoperitoneum. Left inguinal hernia containing nonobstructed sigmoid colon. Musculoskeletal: There is asymmetric fat edema around the upper thigh and lateral gluteal musculature on the right IMPRESSION: 1. No definite explanation for fever. 2. Asymmetric fat edema around the right upper femur which could be posttraumatic or inflammatory. Negative for hip effusion or acute osseous finding. 3. Trace pleural effusions and mild atelectasis. 4. Cholelithiasis. 5. Nonobstructive left nephrolithiasis. 6. Left inguinal hernia containing nonobstructed colon. 7. Moderate stool volume. Electronically Signed   By: Monte Fantasia M.D.   On: 11/21/2016 13:18   Dg Lumbar Spine Complete  Result Date: 11/16/2016 CLINICAL DATA:  Low back and right hip pain, worsening. EXAM:  LUMBAR SPINE - COMPLETE 4+ VIEW COMPARISON:  Abdominal x-ray dated 09/25/2008 and CT abdomen dated 09/20/2008. FINDINGS: Mild levoscoliosis, likely accentuated by patient positioning. Alignment appears  otherwise stable. No fracture line or displaced fracture fragment identified. No acute or suspicious osseous lesion. No compression fracture deformity. Upper sacrum appears intact and normal in mineralization. Mild disc desiccations at each level, with slight disc space narrowings and minimal osseous spurring. No evidence of advanced degenerative change at any level. IVC filter in place with tip at the L1 vertebral body level. Aortic atherosclerosis. Visualized paravertebral soft tissues are otherwise unremarkable. Fairly large amount of stool within the right colon. IMPRESSION: 1. No acute or significant osseous finding. Very mild degenerative change within the lumbar spine. Mild scoliosis. 2. Aortic atherosclerosis. 3. Fairly large amount of stool within the right colon and rectal vault (constipation?). Electronically Signed   By: Franki Cabot M.D.   On: 11/16/2016 14:40   Dg Shoulder Right  Result Date: 11/24/2016 CLINICAL DATA:  Anterior superior shoulder pain around [facility] joint x1 week. EXAM: RIGHT SHOULDER - 2+ VIEW COMPARISON:  None. FINDINGS: There is no evidence of fracture or dislocation. Calcific rotator cuff tendinopathy noted likely along the infraspinatus. Cough chondrocalcinosis of the [facility] joint with minimal undersurface spurring of the distal clavicle. The visualized ribs and lung are unremarkable. There is aortic atherosclerosis. There is uncovertebral joint spurring of the visualized cervical spine from C5 through C7. IMPRESSION: 1. No acute osseous abnormality of the right shoulder. 2. Calcific rotator cuff tendinopathy likely along the infraspinatus 3. Minimal osteoarthritic degenerative change of the AC joint. 4. 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  5. Aortic atherosclerosis. Electronically Signed   By: Ashley Royalty M.D.   On: 11/24/2016 20:06   Ct Head Wo Contrast  Result Date: 11/16/2016 CLINICAL DATA:  Altered mental status.  Confusion. EXAM: CT HEAD WITHOUT CONTRAST TECHNIQUE: Contiguous axial images were obtained from the base of the skull through the vertex without intravenous contrast. COMPARISON:  Brain MRI 12/14/2012 FINDINGS: Brain: No evidence of acute infarction, hemorrhage, extra-axial collection or mass lesion/mass effect. 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 Microvascular ischemic changes mild. Vascular: Atherosclerotic calcification. Skull: No acute or aggressive finding Sinuses/Orbits: Bilateral cataract resection IMPRESSION: 1. No acute finding. 2. 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 Electronically Signed   By: Monte Fantasia M.D.   On: 11/16/2016 19:00   Ct Soft Tissue Neck Wo Contrast  Result Date: 11/24/2016 CLINICAL DATA:  Initial evaluation for acute right shoulder and neck pain. EXAM: CT NECK WITHOUT CONTRAST TECHNIQUE: Multidetector CT imaging of the neck was performed following the standard protocol without intravenous contrast. COMPARISON:  None. FINDINGS: Pharynx and larynx: Evaluation of the oral cavity somewhat limited by motion artifact. Oral cavity grossly unremarkable without mass lesion or loculated fluid collection. No definite acute abnormality about the dentition, although evaluation limited by streak artifact from dental amalgam. Palatine tonsils symmetric and within normal limits. Minimal asymmetric induration within the left parapharyngeal fat noted without additional acute inflammatory changes, of uncertain significance in this patient with right neck pain. Right parapharyngeal fat preserved. Nasopharynx within normal limits. No retropharyngeal swelling or collection. Epiglottis grossly unremarkable. Vallecula clear. Remainder of the hypopharynx and  supraglottic larynx without acute abnormality. True cords grossly symmetric and unremarkable. Subglottic airway clear. Salivary glands: Parotid and submandibular glands within normal limits bilaterally. Thyroid: Thyroid unremarkable. Lymph nodes: No pathologically enlarged lymph nodes identified on this noncontrast examination. Vascular: Atheromatous plaque about the carotid bifurcations bilaterally. Additional plaque within the aortic arch and about the origin of the great vessels. Limited intracranial: Atherosclerotic disease noted within the carotid siphons. Visualized  portions of the brain otherwise unremarkable. Visualized orbits: Partially visualized globes and orbits within normal normal limits. Mastoids and visualized paranasal sinuses: Small retention cyst noted within the right maxillary sinus. Visualized paranasal sinuses are otherwise clear. Visualize mastoids are clear. Middle ear cavities well pneumatized. Skeleton: No acute osseous abnormality. No worrisome lytic or blastic osseous lesions. Moderate degenerative spondylolysis present at C5-6. Upper chest: Visualized upper mediastinum within normal limits. Partially visualized lungs are clear. IMPRESSION: 1. Mild asymmetric induration within the left parapharyngeal fat as compared to the right. Finding is nonspecific, but could reflect the sequelae of early/ developing acute pharyngitis. Correlation with physical exam and symptomatology recommended. 2. No other acute abnormality identified within the neck. 3. Moderate atheromatous disease about the carotid bifurcations bilaterally. 4. Moderate degenerative spondylolysis at C5-6. Electronically Signed   By: Jeannine Boga M.D.   On: 11/24/2016 19:40   Ct Chest Wo Contrast  Result Date: 11/21/2016 CLINICAL DATA:  Fever. EXAM: CT CHEST, ABDOMEN AND PELVIS WITHOUT CONTRAST TECHNIQUE: Multidetector CT imaging of the chest, abdomen and pelvis was performed following the standard protocol without IV  contrast. COMPARISON:  09/20/2008 abdominal CT FINDINGS: CT CHEST FINDINGS Cardiovascular: No cardiomegaly or pericardial effusion. Atherosclerosis, including the coronary arteries. Prominent aortic valvular/annular calcification. Negative esophagus. Mediastinum/Nodes: Negative for mass or adenopathy. Lungs/Pleura: Trace layering pleural effusions. 5 mm pulmonary nodule below the right minor fissure has a flat morphology most consistent with lymph node. No suspicious pulmonary nodule. Mild atelectasis at the bases. Musculoskeletal: No acute or aggressive finding CT ABDOMEN PELVIS FINDINGS Hepatobiliary: Small scattered cystic density structures in the liver. No concerning change since since 2009.Cholelithiasis. No evidence of acute cholecystitis. Normal common bile duct diameter. Pancreas: Unremarkable. Spleen: Unremarkable. Adrenals/Urinary Tract: Negative adrenals. No hydronephrosis or ureteral calculus. 2 left renal calculi measuring up to 4 mm in the upper pole. Unremarkable bladder. Stomach/Bowel: Formed stool throughout the colon. No small bowel obstruction or inflammation. Presacral edema may be related to rectal distention (although only moderate) or volume overload. There is subcutaneous edema about the bilateral hips. Negative appendix. Colonic diverticulosis. Vascular/Lymphatic: No acute vascular abnormality. IVC filter. Aortic atherosclerosis. No mass or adenopathy. Reproductive:No pathologic findings. Other: No ascites or pneumoperitoneum. Left inguinal hernia containing nonobstructed sigmoid colon. Musculoskeletal: There is asymmetric fat edema around the upper thigh and lateral gluteal musculature on the right IMPRESSION: 1. No definite explanation for fever. 2. Asymmetric fat edema around the right upper femur which could be posttraumatic or inflammatory. Negative for hip effusion or acute osseous finding. 3. Trace pleural effusions and mild atelectasis. 4. Cholelithiasis. 5. Nonobstructive left  nephrolithiasis. 6. Left inguinal hernia containing nonobstructed colon. 7. Moderate stool volume. Electronically Signed   By: Monte Fantasia M.D.   On: 11/21/2016 13:18   Dg Chest Port 1 View  Result Date: 11/16/2016 CLINICAL DATA:  Possible fever; Complications from anesthesia; Had IVC filter placed for DVTs in right leg 3 days ago; Right leg is now swollen, firm and tender; the patient has extreme back and right hip pain; Hx of HTN, CKD, CAD, aortic stenosis EXAM: PORTABLE CHEST 1 VIEW COMPARISON:  07/24/2016 FINDINGS: Shallow lung inflation. Heart size is normal. Patchy infiltrate is identified in the left lung base. There is subsegmental atelectasis in the right lower lobe. No pulmonary edema. IMPRESSION: Left lower lobe infiltrate.  Right lower lobe atelectasis. Electronically Signed   By: Nolon Nations M.D.   On: 11/16/2016 17:03   Dg Hip Unilat W Or W/o Pelvis 2-3 Views Right  Result Date: 11/16/2016 CLINICAL DATA:  Low back and right hip pain, worsening. No known trauma. EXAM: DG HIP (WITH OR WITHOUT PELVIS) 2-3V RIGHT COMPARISON:  None. FINDINGS: Single view of the pelvis and two views of the right hip are provided. Osseous alignment is normal. Bone mineralization is normal. No fracture line or displaced fracture fragment identified. No acute or suspicious osseous lesion. Mild degenerative joint space narrowing at the right hip, with associated mild osseous spurring. No evidence of advanced DJD. Soft tissues about the pelvis and right hip are unremarkable. IMPRESSION: 1. No acute findings. 2. Mild degenerative change at the right hip joint. Electronically Signed   By: Franki Cabot M.D.   On: 11/16/2016 14:41   US Abdomen Limited Ruq  Result Date: 11/20/2016 CLINICAL DATA:  Elevated liver function studies. History of hypertension, GE reflux disease, dyslipidemia, liver cysts. EXAM: US ABDOMEN LIMITED - RIGHT UPPER QUADRANT COMPARISON:  CT abdomen and pelvis 09/20/2008 FINDINGS: Gallbladder:  Small amount of sludge layering in the gallbladder. No stones identified. No gallbladder wall thickening. Murphy's sign is negative. Common bile duct: Diameter: 3.2 mm, normal Liver: Liver parenchymal echotexture is diffusely increased suggesting fatty infiltration. Cystic lesions seen on prior CT are not well identified at ultrasound. Examination is limited due to patient's body habitus and inability to reposition the patient. IMPRESSION: Gallbladder sludge. No stones. No additional changes to suggest cholecystitis. Probable fatty infiltration of the liver. Electronically Signed   By: Lucienne Capers M.D.   On: 11/20/2016 22:07    ASSESSMENT/PLAN:  Generalized weakness -  For rehabilitation, for PT and OT, for therapeutic strengthening exercises; fall precautions  Metabolic encephalopathy - likely from multifactorial, from infection and family reports prolonged encephalopathy with anesthesia in the past. CT head showed normal pressure hydrocephalus, no acute findings, RPR/HIV negative, B12/folate unremarkable, TSH within normal, ammonia 16. He had intermittent agitation and delirium and was started on Seroquel, will decrease Seroquel to 25 mg 1 tab by mouth from BID to Q HS  Sepsis - he had confusion, WBC 14.2 and fever 102, chest x-ray showed left lower lobe infiltrate. He was treated with VAC/Zosyn then changed to doxycycline on 2/8. He spiked another fever 100.8 on 2/9. CT chest/ab/pel on 2/12 no clear source of infection, repeat UA unremarkable, repeat blood culture no growth, a NA unremarkable. Infectious disease consulted who recommended observe off antibiotics. No fever 2 days of antibiotic so discharged to SNF; will monitor  Chronic kidney disease, stage III - creatinine 1.44; re-check BMP  Acute DVT of femoral vein, RLE - IVC filter placed secondary to GI bleed to Xarelto; check CBC  Bilateral lower extremity edma - no edema at present; continue Lasix 40 mg 1 tab by mouth daily when  necessary  Chronic anemia - hgb 9.2; negative EGD; will follow-up with GI, Dr. Watt Climes, check CBC  CAD - no chest pains; continue simvastatin 20 mg 1 tab by mouth daily evening; aspirin was discontinued from recent hospitalization due to GI bleed  Hyperlipidemia - continue simvastatin 20 mg 1 tab by mouth daily evening  History of GI bleed - Continue omeprazole 40 mg 1 capsule by mouth daily     Goals of care:  Short-term rehabilitation   Shona Pardo C. [place] - NP  [employer] [phone]

## 2016-12-11 DIAGNOSIS — 419620001 Death: Secondary | SNOMED CT | POA: Diagnosis not present

## 2016-12-11 DEATH — deceased
